# Patient Record
Sex: Male | Born: 1980 | Race: Black or African American | Hispanic: No | Smoking: Current every day smoker
Health system: Southern US, Community
[De-identification: ages and names within clinical notes are randomized; demographics above are authoritative.]

## PROBLEM LIST (undated history)

## (undated) DIAGNOSIS — G8929 Other chronic pain: Secondary | ICD-10-CM

## (undated) DIAGNOSIS — M79643 Pain in unspecified hand: Secondary | ICD-10-CM

## (undated) DIAGNOSIS — M5030 Other cervical disc degeneration, unspecified cervical region: Secondary | ICD-10-CM

## (undated) DIAGNOSIS — M654 Radial styloid tenosynovitis [de Quervain]: Secondary | ICD-10-CM

## (undated) DIAGNOSIS — Z765 Malingerer [conscious simulation]: Secondary | ICD-10-CM

## (undated) DIAGNOSIS — F1120 Opioid dependence, uncomplicated: Secondary | ICD-10-CM

## (undated) DIAGNOSIS — M549 Dorsalgia, unspecified: Secondary | ICD-10-CM

## (undated) HISTORY — PX: SPINAL FUSION: SHX223

---

## 2000-10-17 ENCOUNTER — Emergency Department (HOSPITAL_COMMUNITY): Admission: EM | Admit: 2000-10-17 | Discharge: 2000-10-17 | Payer: Self-pay | Admitting: Internal Medicine

## 2001-01-05 ENCOUNTER — Emergency Department (HOSPITAL_COMMUNITY): Admission: EM | Admit: 2001-01-05 | Discharge: 2001-01-05 | Payer: Self-pay | Admitting: *Deleted

## 2002-01-17 ENCOUNTER — Emergency Department (HOSPITAL_COMMUNITY): Admission: EM | Admit: 2002-01-17 | Discharge: 2002-01-17 | Payer: Self-pay | Admitting: Emergency Medicine

## 2002-01-21 ENCOUNTER — Emergency Department (HOSPITAL_COMMUNITY): Admission: EM | Admit: 2002-01-21 | Discharge: 2002-01-21 | Payer: Self-pay | Admitting: Emergency Medicine

## 2002-07-18 ENCOUNTER — Emergency Department (HOSPITAL_COMMUNITY): Admission: EM | Admit: 2002-07-18 | Discharge: 2002-07-18 | Payer: Self-pay | Admitting: Emergency Medicine

## 2006-12-03 ENCOUNTER — Emergency Department (HOSPITAL_COMMUNITY): Admission: EM | Admit: 2006-12-03 | Discharge: 2006-12-03 | Payer: Self-pay | Admitting: Emergency Medicine

## 2007-11-08 ENCOUNTER — Emergency Department (HOSPITAL_COMMUNITY): Admission: EM | Admit: 2007-11-08 | Discharge: 2007-11-08 | Payer: Self-pay | Admitting: Emergency Medicine

## 2008-01-11 ENCOUNTER — Emergency Department (HOSPITAL_COMMUNITY): Admission: EM | Admit: 2008-01-11 | Discharge: 2008-01-12 | Payer: Self-pay | Admitting: Emergency Medicine

## 2008-02-08 ENCOUNTER — Emergency Department (HOSPITAL_COMMUNITY): Admission: EM | Admit: 2008-02-08 | Discharge: 2008-02-09 | Payer: Self-pay | Admitting: Emergency Medicine

## 2008-07-09 ENCOUNTER — Emergency Department (HOSPITAL_COMMUNITY): Admission: EM | Admit: 2008-07-09 | Discharge: 2008-07-09 | Payer: Self-pay | Admitting: Emergency Medicine

## 2009-01-23 ENCOUNTER — Emergency Department (HOSPITAL_COMMUNITY): Admission: EM | Admit: 2009-01-23 | Discharge: 2009-01-23 | Payer: Self-pay | Admitting: Emergency Medicine

## 2009-02-13 ENCOUNTER — Emergency Department (HOSPITAL_COMMUNITY): Admission: EM | Admit: 2009-02-13 | Discharge: 2009-02-13 | Payer: Self-pay | Admitting: Emergency Medicine

## 2009-06-12 ENCOUNTER — Emergency Department (HOSPITAL_COMMUNITY): Admission: EM | Admit: 2009-06-12 | Discharge: 2009-06-13 | Payer: Self-pay | Admitting: Emergency Medicine

## 2009-11-23 ENCOUNTER — Emergency Department (HOSPITAL_COMMUNITY): Admission: EM | Admit: 2009-11-23 | Discharge: 2009-11-23 | Payer: Self-pay | Admitting: Emergency Medicine

## 2011-03-08 LAB — URINALYSIS, ROUTINE W REFLEX MICROSCOPIC
Glucose, UA: NEGATIVE
Ketones, ur: NEGATIVE
Nitrite: NEGATIVE
Protein, ur: NEGATIVE
Urobilinogen, UA: 1
pH: 6

## 2011-12-18 ENCOUNTER — Emergency Department (HOSPITAL_COMMUNITY): Payer: Self-pay

## 2011-12-18 ENCOUNTER — Emergency Department (HOSPITAL_COMMUNITY)
Admission: EM | Admit: 2011-12-18 | Discharge: 2011-12-18 | Disposition: A | Discharge status: Home / Self Care | Payer: Self-pay | Attending: Emergency Medicine | Admitting: Emergency Medicine

## 2011-12-18 ENCOUNTER — Encounter (HOSPITAL_COMMUNITY): Payer: Self-pay | Admitting: *Deleted

## 2011-12-18 DIAGNOSIS — W138XXA Fall from, out of or through other building or structure, initial encounter: Secondary | ICD-10-CM | POA: Insufficient documentation

## 2011-12-18 DIAGNOSIS — R109 Unspecified abdominal pain: Secondary | ICD-10-CM | POA: Insufficient documentation

## 2011-12-18 DIAGNOSIS — M546 Pain in thoracic spine: Secondary | ICD-10-CM | POA: Insufficient documentation

## 2011-12-18 DIAGNOSIS — M545 Low back pain, unspecified: Secondary | ICD-10-CM | POA: Insufficient documentation

## 2011-12-18 DIAGNOSIS — M549 Dorsalgia, unspecified: Secondary | ICD-10-CM

## 2011-12-18 DIAGNOSIS — Y92009 Unspecified place in unspecified non-institutional (private) residence as the place of occurrence of the external cause: Secondary | ICD-10-CM | POA: Insufficient documentation

## 2011-12-18 DIAGNOSIS — R079 Chest pain, unspecified: Secondary | ICD-10-CM | POA: Insufficient documentation

## 2011-12-18 DIAGNOSIS — W19XXXA Unspecified fall, initial encounter: Secondary | ICD-10-CM

## 2011-12-18 DIAGNOSIS — F17200 Nicotine dependence, unspecified, uncomplicated: Secondary | ICD-10-CM | POA: Insufficient documentation

## 2011-12-18 MED ORDER — HYDROMORPHONE HCL PF 1 MG/ML IJ SOLN
1.0000 mg | Freq: Once | INTRAMUSCULAR | Status: AC
  Administered 2011-12-18: 1 mg via INTRAVENOUS
  Filled 2011-12-18: qty 1

## 2011-12-18 MED ORDER — HYDROCODONE-ACETAMINOPHEN 5-500 MG PO TABS: 1.0000 | ORAL_TABLET | Freq: Four times a day (QID) | ORAL | Status: AC | PRN

## 2011-12-18 MED ORDER — IBUPROFEN 600 MG PO TABS: 600.0000 mg | ORAL_TABLET | Freq: Three times a day (TID) | ORAL | Status: AC | PRN

## 2011-12-18 MED ORDER — IOHEXOL 300 MG/ML  SOLN
100.0000 mL | Freq: Once | INTRAMUSCULAR | Status: AC | PRN
  Administered 2011-12-18: 100 mL via INTRAVENOUS

## 2011-12-18 NOTE — ED Notes (Signed)
Discharge instructions reviewed with pt, questions answered. Pt verbalized understanding.  

## 2011-12-18 NOTE — ED Notes (Addendum)
Pt fell off roof, lost balance .  Pt was on a 2 story house, working on antenna.  Landed on ground.  Pain rt mid to low back, No loc, No head injury.  Pain RUQ.

## 2011-12-18 NOTE — ED Notes (Signed)
Dr. Denton Lank in and removed c-collar from pt.

## 2011-12-18 NOTE — ED Provider Notes (Addendum)
History     CSN: [id number]  Arrival date & time 12/18/11  1741   First MD Initiated Contact with Patient 12/18/11 1817      Chief Complaint  Patient presents with  . Fall    (Consider location/radiation/quality/duration/timing/severity/associated sxs/prior treatment) Patient is a 31 y.o. male presenting with fall. The history is provided by the patient.  Fall Pertinent negatives include no fever, no numbness, no abdominal pain, no vomiting, no hematuria and no headaches.  fall from 2nd story roof to ground. Was at home, plugging a cable into antenna when lost footing and fell. No loc. C/o right flank pain and right back pain. Constant, dull, non radiating, worse w movement.palpation. No neck pain. No radicular pain or leg pain. No numbness/weakness. ruq abd pain. No vomiting. Was asymptomatic prior to slip and fall, no faintess or dizziness.     History reviewed. No pertinent past medical history.  History reviewed. No pertinent past surgical history.  History reviewed. No pertinent family history.  History  Substance Use Topics  . Smoking status: Current Everyday Smoker  . Smokeless tobacco: Not on file  . Alcohol Use: No      Review of Systems  Constitutional: Negative for fever.  HENT: Negative for neck pain.   Eyes: Negative for pain.  Respiratory: Negative for shortness of breath.   Cardiovascular: Negative for chest pain.  Gastrointestinal: Negative for vomiting and abdominal pain.  Genitourinary: Positive for flank pain. Negative for hematuria.  Musculoskeletal: Positive for back pain.  Skin: Negative for rash.  Neurological: Negative for weakness, numbness and headaches.  Hematological: Does not bruise/bleed easily.  Psychiatric/Behavioral: Negative for confusion.    Allergies  Review of patient's allergies indicates no known allergies.  Home Medications  No current outpatient prescriptions on file.  BP 124/86  Pulse 105  Temp 98.6 F (37 C) (Oral)   Resp 20  Ht 5' (1.524 m)  Wt 169 lb 9 oz (76.913 kg)  BMI 33.12 kg/m2  SpO2 98%  Physical Exam  Nursing note and vitals reviewed. Constitutional: He is oriented to person, place, and time. He appears well-developed and well-nourished. No distress.  HENT:  Head: Atraumatic.  Nose: Nose normal.  Mouth/Throat: Oropharynx is clear and moist.  Eyes: Conjunctivae are normal. Pupils are equal, round, and reactive to light. No scleral icterus.  Neck: Normal range of motion. Neck supple. No tracheal deviation present.  Cardiovascular: Normal rate, regular rhythm, normal heart sounds and intact distal pulses.   Pulmonary/Chest: Effort normal and breath sounds normal. No accessory muscle usage. No respiratory distress. He exhibits tenderness.       Tenderness right lower ribs and/lat/posteriorly. No crepitus.   Abdominal: Soft. He exhibits no distension and no mass. There is tenderness. There is no rebound and no guarding.       Ruq/right abd tenderness. No rebound or guarding. No abd contusion or bruising noted.   Musculoskeletal: Normal range of motion. He exhibits no edema.       Diffuse tenderness lower thoracic and upper lumbar region. Spine aligned, no step off. c spine non tender, aligned, no step off. Good rom extremities without pain or focal bony tenderness  Neurological: He is alert and oriented to person, place, and time.       Motor intact bil. Steady gait.   Skin: Skin is warm and dry.  Psychiatric: He has a normal mood and affect.    ED Course  Procedures (including critical care time)  Dg Ribs Unilateral W/chest  Right  12/18/2011  *RADIOLOGY REPORT*  Clinical Data: Fall  RIGHT RIBS AND CHEST - 3+ VIEW  Comparison: None.  Findings: Normal heart size.  Lungs are under aerated and grossly clear.  No evidence of acute rib fracture.  No pneumothorax.  IMPRESSION: Low lung volumes.  No active cardiopulmonary disease.  No acute rib fracture.  Original Report Authenticated By: Donavan Burnet, M.D.   Dg Thoracic Spine 2 View  12/18/2011  *RADIOLOGY REPORT*  Clinical Data: Fall  THORACIC SPINE - 2 VIEW  Comparison: 07/09/2008  Findings: Anatomic alignment.  No vertebral compression deformity. No definite acute fracture.  IMPRESSION: No acute bony pathology in the thoracic spine to suggest injury.  Original Report Authenticated By: Donavan Burnet, M.D.   Ct Abdomen Pelvis W Contrast  12/18/2011  *RADIOLOGY REPORT*  Clinical Data: Fall  CT ABDOMEN AND PELVIS WITH CONTRAST  Technique:  Multidetector CT imaging of the abdomen and pelvis was performed following the standard protocol during bolus administration of intravenous contrast.  Contrast: OMNIPAQUE IOHEXOL 300 MG/ML  SOLN  Comparison: None.  Findings: The liver, gallbladder, spleen, pancreas, adrenal glands, kidneys are within normal limits.  No hemoperitoneum.  No free fluid.  Normal appendix.  Small mesenteric and retroperitoneal nodes.  Bladder and prostate are unremarkable.  No acute bony deformity.  Specifically, no evidence of lumbar compression deformity.  IMPRESSION: No acute intra-abdominal pathology.  No evidence of lumbar spine injury.  Original Report Authenticated By: Donavan Burnet, M.D.       MDM  Iv ns, dilaudid 1 mg iv.   Xr/ct with no acute process.   Recheck spine nt. Pt comfortable.       Suzi Roots, MD 12/18/11 1955  Suzi Roots, MD 12/18/11 [phone]

## 2012-04-29 ENCOUNTER — Encounter (HOSPITAL_COMMUNITY): Payer: Self-pay

## 2012-04-29 ENCOUNTER — Emergency Department (HOSPITAL_COMMUNITY)
Admission: EM | Admit: 2012-04-29 | Discharge: 2012-04-29 | Disposition: A | Discharge status: Home / Self Care | Payer: Self-pay | Attending: Emergency Medicine | Admitting: Emergency Medicine

## 2012-04-29 ENCOUNTER — Emergency Department (HOSPITAL_COMMUNITY): Payer: Self-pay

## 2012-04-29 DIAGNOSIS — Y939 Activity, unspecified: Secondary | ICD-10-CM | POA: Insufficient documentation

## 2012-04-29 DIAGNOSIS — IMO0002 Reserved for concepts with insufficient information to code with codable children: Secondary | ICD-10-CM | POA: Insufficient documentation

## 2012-04-29 DIAGNOSIS — F17200 Nicotine dependence, unspecified, uncomplicated: Secondary | ICD-10-CM | POA: Insufficient documentation

## 2012-04-29 DIAGNOSIS — S93409A Sprain of unspecified ligament of unspecified ankle, initial encounter: Secondary | ICD-10-CM | POA: Insufficient documentation

## 2012-04-29 DIAGNOSIS — Y9289 Other specified places as the place of occurrence of the external cause: Secondary | ICD-10-CM | POA: Insufficient documentation

## 2012-04-29 MED ORDER — HYDROCODONE-ACETAMINOPHEN 5-325 MG PO TABS
1.0000 | ORAL_TABLET | Freq: Once | ORAL | Status: AC
  Administered 2012-04-29: 1 via ORAL
  Filled 2012-04-29: qty 1

## 2012-04-29 MED ORDER — IBUPROFEN 800 MG PO TABS
800.0000 mg | ORAL_TABLET | Freq: Once | ORAL | Status: AC
  Administered 2012-04-29: 800 mg via ORAL
  Filled 2012-04-29: qty 1

## 2012-04-29 NOTE — ED Notes (Signed)
Pt c/o pain to left ankle.  Says hurt ankle playing football this morning.

## 2012-04-29 NOTE — ED Provider Notes (Signed)
History     CSN: [id number]  Arrival date & time 04/29/12  1322   First MD Initiated Contact with Patient 04/29/12 1335      Chief Complaint  Patient presents with  . Ankle Pain    (Consider location/radiation/quality/duration/timing/severity/associated sxs/prior treatment) HPI Comments: Patient c/o pain and swelling to his left ankle that began after playing football and another player tackled him causing a twisting of his ankle.  Patient c/o inability to bear weight to the ankle due to level of pain.  He denies head injury or proximal pain.  Patient is a 31 y.o. male presenting with ankle pain. The history is provided by the patient.  Ankle Pain  The incident occurred 3 to 5 hours ago. The incident occurred in the yard. The injury mechanism was a direct blow, a fall and torsion. The pain is present in the left ankle. The quality of the pain is described as aching and throbbing. The pain is moderate. The pain has been constant since onset. Associated symptoms include inability to bear weight. Pertinent negatives include no numbness, no loss of motion, no muscle weakness, no loss of sensation and no tingling. He reports no foreign bodies present. The symptoms are aggravated by activity, bearing weight and palpation. He has tried nothing for the symptoms. The treatment provided no relief.    History reviewed. No pertinent past medical history.  History reviewed. No pertinent past surgical history.  No family history on file.  History  Substance Use Topics  . Smoking status: Current Every Day Smoker  . Smokeless tobacco: Not on file  . Alcohol Use: No      Review of Systems  Constitutional: Negative for fever and chills.  HENT: Negative for neck pain.   Genitourinary: Negative for dysuria and difficulty urinating.  Musculoskeletal: Positive for joint swelling and arthralgias.  Skin: Negative for color change and wound.  Neurological: Negative for dizziness, tingling,  numbness and headaches.  All other systems reviewed and are negative.    Allergies  Review of patient's allergies indicates no known allergies.  Home Medications  No current outpatient prescriptions on file.  BP 134/84  Pulse 91  Temp 98.6 F (37 C) (Oral)  Resp 20  Ht 5\' 1"  (1.549 m)  Wt 180 lb (81.647 kg)  BMI 34.01 kg/m2  SpO2 99%  Physical Exam  Nursing note and vitals reviewed. Constitutional: He is oriented to person, place, and time. He appears well-developed and well-nourished. No distress.  HENT:  Head: Normocephalic and atraumatic.  Cardiovascular: Normal rate, regular rhythm, normal heart sounds and intact distal pulses.   Pulmonary/Chest: Effort normal and breath sounds normal.  Musculoskeletal: He exhibits tenderness.       left ankle is diffusely ttp, mild to moderate STS is present.  DP pulse is brisk, distal sensation intact.  No erythema, abrasion, bruising or deformity.  No proximal tenderness  Neurological: He is alert and oriented to person, place, and time. He exhibits normal muscle tone. Coordination normal.  Skin: Skin is warm and dry.    ED Course  Procedures (including critical care time)  Labs Reviewed - No data to display Dg Ankle Complete Left  04/29/2012  *RADIOLOGY REPORT*  Clinical Data: Ankle pain, football injury  LEFT ANKLE COMPLETE - 3+ VIEW  Comparison: None.  Findings: Lateral soft tissue swelling.  No fracture or dislocation.  Ankle mortise intact.  IMPRESSION: As above.   Original Report Authenticated By: Davonna Belling, M.D.      ASO  splint applied, crutches given.  Pain improved, remains NV intact   MDM     Diffuse ttp of the left medial and lateral ankle.  Moderate STS.  Pt agrees to RICE therapy and to f/u with Dr. Romeo Apple.    Prescribed: norco #15 ibuprofen  (handwritten due to printer issue)    Ishanvi Mcquitty L. [place], [place] 04/30/12 2209

## 2012-05-02 NOTE — ED Provider Notes (Signed)
Medical screening examination/treatment/procedure(s) were performed by non-physician practitioner and as supervising physician I was immediately available for consultation/collaboration. Lynann Demetrius, MD, FACEP   Dino Borntreger L [place] Dalporto, MD 05/02/12 1506 

## 2012-12-26 ENCOUNTER — Emergency Department (HOSPITAL_COMMUNITY)
Admission: EM | Admit: 2012-12-26 | Discharge: 2012-12-26 | Disposition: A | Discharge status: Home / Self Care | Payer: Self-pay | Attending: Emergency Medicine | Admitting: Emergency Medicine

## 2012-12-26 ENCOUNTER — Encounter (HOSPITAL_COMMUNITY): Payer: Self-pay | Admitting: *Deleted

## 2012-12-26 ENCOUNTER — Emergency Department (HOSPITAL_COMMUNITY): Payer: Self-pay

## 2012-12-26 DIAGNOSIS — S6990XA Unspecified injury of unspecified wrist, hand and finger(s), initial encounter: Secondary | ICD-10-CM | POA: Insufficient documentation

## 2012-12-26 DIAGNOSIS — Y92410 Unspecified street and highway as the place of occurrence of the external cause: Secondary | ICD-10-CM | POA: Insufficient documentation

## 2012-12-26 DIAGNOSIS — Y9389 Activity, other specified: Secondary | ICD-10-CM | POA: Insufficient documentation

## 2012-12-26 DIAGNOSIS — S46911A Strain of unspecified muscle, fascia and tendon at shoulder and upper arm level, right arm, initial encounter: Secondary | ICD-10-CM

## 2012-12-26 DIAGNOSIS — F17200 Nicotine dependence, unspecified, uncomplicated: Secondary | ICD-10-CM | POA: Insufficient documentation

## 2012-12-26 DIAGNOSIS — IMO0002 Reserved for concepts with insufficient information to code with codable children: Secondary | ICD-10-CM | POA: Insufficient documentation

## 2012-12-26 DIAGNOSIS — S8990XA Unspecified injury of unspecified lower leg, initial encounter: Secondary | ICD-10-CM | POA: Insufficient documentation

## 2012-12-26 DIAGNOSIS — S82892A Other fracture of left lower leg, initial encounter for closed fracture: Secondary | ICD-10-CM

## 2012-12-26 DIAGNOSIS — S8253XA Displaced fracture of medial malleolus of unspecified tibia, initial encounter for closed fracture: Secondary | ICD-10-CM | POA: Insufficient documentation

## 2012-12-26 MED ORDER — ONDANSETRON HCL 4 MG PO TABS
4.0000 mg | ORAL_TABLET | Freq: Once | ORAL | Status: AC
  Administered 2012-12-26: 4 mg via ORAL
  Filled 2012-12-26: qty 1

## 2012-12-26 MED ORDER — DIAZEPAM 5 MG PO TABS
5.0000 mg | ORAL_TABLET | Freq: Once | ORAL | Status: AC
  Administered 2012-12-26: 5 mg via ORAL
  Filled 2012-12-26: qty 1

## 2012-12-26 MED ORDER — HYDROCODONE-ACETAMINOPHEN 5-325 MG PO TABS
2.0000 | ORAL_TABLET | Freq: Once | ORAL | Status: AC
  Administered 2012-12-26: 2 via ORAL
  Filled 2012-12-26: qty 2

## 2012-12-26 MED ORDER — BACLOFEN 10 MG PO TABS: 10.0000 mg | ORAL_TABLET | Freq: Three times a day (TID) | ORAL | Status: AC

## 2012-12-26 MED ORDER — MELOXICAM 7.5 MG PO TABS: ORAL_TABLET | ORAL | Status: DC

## 2012-12-26 MED ORDER — HYDROCODONE-ACETAMINOPHEN 7.5-325 MG PO TABS: 1.0000 | ORAL_TABLET | ORAL | Status: DC | PRN
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
## 2012-12-26 NOTE — ED Notes (Signed)
H. Bryant, PA at bedside. 
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
## 2012-12-29 NOTE — ED Provider Notes (Signed)
Medical screening examination/treatment/procedure(s) were performed by non-physician practitioner and as supervising physician I was immediately available for consultation/collaboration.  Karen Kinnard B. Hau Sanor, MD 12/29/12 1654 

## 2013-05-06 ENCOUNTER — Emergency Department (HOSPITAL_COMMUNITY): Payer: Self-pay

## 2013-05-06 ENCOUNTER — Encounter (HOSPITAL_COMMUNITY): Payer: Self-pay | Admitting: Emergency Medicine

## 2013-05-06 DIAGNOSIS — S6990XA Unspecified injury of unspecified wrist, hand and finger(s), initial encounter: Secondary | ICD-10-CM | POA: Insufficient documentation

## 2013-05-06 DIAGNOSIS — Y9389 Activity, other specified: Secondary | ICD-10-CM | POA: Insufficient documentation

## 2013-05-06 DIAGNOSIS — S59909A Unspecified injury of unspecified elbow, initial encounter: Secondary | ICD-10-CM | POA: Insufficient documentation

## 2013-05-06 DIAGNOSIS — X500XXA Overexertion from strenuous movement or load, initial encounter: Secondary | ICD-10-CM | POA: Insufficient documentation

## 2013-05-06 DIAGNOSIS — R296 Repeated falls: Secondary | ICD-10-CM | POA: Insufficient documentation

## 2013-05-06 DIAGNOSIS — F17200 Nicotine dependence, unspecified, uncomplicated: Secondary | ICD-10-CM | POA: Insufficient documentation

## 2013-05-06 DIAGNOSIS — Y990 Civilian activity done for income or pay: Secondary | ICD-10-CM | POA: Insufficient documentation

## 2013-05-06 DIAGNOSIS — S6980XA Other specified injuries of unspecified wrist, hand and finger(s), initial encounter: Secondary | ICD-10-CM | POA: Insufficient documentation

## 2013-05-06 DIAGNOSIS — S4980XA Other specified injuries of shoulder and upper arm, unspecified arm, initial encounter: Secondary | ICD-10-CM | POA: Insufficient documentation

## 2013-05-06 DIAGNOSIS — Y9289 Other specified places as the place of occurrence of the external cause: Secondary | ICD-10-CM | POA: Insufficient documentation

## 2013-05-06 DIAGNOSIS — S46909A Unspecified injury of unspecified muscle, fascia and tendon at shoulder and upper arm level, unspecified arm, initial encounter: Secondary | ICD-10-CM | POA: Insufficient documentation

## 2013-05-06 NOTE — ED Notes (Signed)
Patient states he was at work yesterday and when he turned a knob he felt a pop in his right hand. Complaining of increasing pain in right hand.

## 2013-05-07 ENCOUNTER — Emergency Department (HOSPITAL_COMMUNITY)
Admission: EM | Admit: 2013-05-07 | Discharge: 2013-05-07 | Disposition: A | Discharge status: Home / Self Care | Payer: Self-pay | Attending: Emergency Medicine | Admitting: Emergency Medicine

## 2013-05-07 DIAGNOSIS — S6992XA Unspecified injury of left wrist, hand and finger(s), initial encounter: Secondary | ICD-10-CM

## 2013-05-07 MED ORDER — HYDROCODONE-ACETAMINOPHEN 5-325 MG PO TABS
1.0000 | ORAL_TABLET | Freq: Once | ORAL | Status: AC
  Administered 2013-05-07: 1 via ORAL
  Filled 2013-05-07: qty 1

## 2013-05-07 MED ORDER — HYDROCODONE-ACETAMINOPHEN 5-325 MG PO TABS: 1.0000 | ORAL_TABLET | ORAL | Status: DC | PRN

## 2013-05-07 MED ORDER — IBUPROFEN 800 MG PO TABS: 800.0000 mg | ORAL_TABLET | Freq: Three times a day (TID) | ORAL | Status: DC

## 2013-05-07 MED ORDER — IBUPROFEN 800 MG PO TABS
800.0000 mg | ORAL_TABLET | Freq: Once | ORAL | Status: AC
  Administered 2013-05-07: 800 mg via ORAL
  Filled 2013-05-07: qty 1

## 2013-05-07 MED ORDER — ONDANSETRON HCL 4 MG PO TABS
4.0000 mg | ORAL_TABLET | Freq: Once | ORAL | Status: AC
  Administered 2013-05-07: 4 mg via ORAL
  Filled 2013-05-07: qty 1

## 2013-05-07 NOTE — ED Provider Notes (Signed)
Medical screening examination/treatment/procedure(s) were performed by non-physician practitioner and as supervising physician I was immediately available for consultation/collaboration.    Sunnie Nielsen, MD 05/07/13 [phone]

## 2013-05-07 NOTE — ED Provider Notes (Signed)
CSN: [id number]     Arrival date & time 05/06/13  2133 History   First MD Initiated Contact with Patient 05/07/13 0013     Chief Complaint  Patient presents with  . Hand Pain   (Consider location/radiation/quality/duration/timing/severity/associated sxs/prior Treatment) HPI Comments: Pt fell on an out stretched hand and injured the right hand. Pt has pain with ROM of the thumb and soreness of the other fingers.  No previous surgeries of the right hand. He has had injury of the right hand. Pt is not on blood thinning meds, and has no hx of bleeding disorders. He has not taken any medication for this problem.   Patient is a 32 y.o. male presenting with hand pain. The history is provided by the patient.  Hand Pain Pertinent negatives include no abdominal pain, arthralgias, chest pain, coughing or neck pain.    History reviewed. No pertinent past medical history. History reviewed. No pertinent past surgical history. History reviewed. No pertinent family history. History  Substance Use Topics  . Smoking status: Current Every Day Smoker -- 0.50 packs/day    Types: Cigarettes  . Smokeless tobacco: Not on file  . Alcohol Use: No    Review of Systems  Constitutional: Negative for activity change.       All ROS Neg except as noted in HPI  HENT: Negative for nosebleeds.   Eyes: Negative for photophobia and discharge.  Respiratory: Negative for cough, shortness of breath and wheezing.   Cardiovascular: Negative for chest pain and palpitations.  Gastrointestinal: Negative for abdominal pain and blood in stool.  Genitourinary: Negative for dysuria, frequency and hematuria.  Musculoskeletal: Negative for arthralgias, back pain and neck pain.  Skin: Negative.   Neurological: Negative for dizziness, seizures and speech difficulty.  Psychiatric/Behavioral: Negative for hallucinations and confusion.    Allergies  Review of patient's allergies indicates no known allergies.  Home Medications    Current Outpatient Rx  Name  Route  Sig  Dispense  Refill  . HYDROcodone-acetaminophen (NORCO) 7.5-325 MG per tablet   Oral   Take 1 tablet by mouth every 4 (four) hours as needed for pain.   20 tablet   0   . meloxicam (MOBIC) 7.5 MG tablet      1 po bid with food   12 tablet   0    BP 140/62  Pulse 84  Temp(Src) 99.1 F (37.3 C) (Oral)  Resp 20  Ht 5\' 2"  (1.575 m)  Wt 166 lb 5 oz (75.439 kg)  BMI 30.41 kg/m2  SpO2 100% Physical Exam  Nursing note and vitals reviewed. Constitutional: He is oriented to person, place, and time. He appears well-developed and well-nourished.  Non-toxic appearance.  HENT:  Head: Normocephalic.  Right Ear: Tympanic membrane and external ear normal.  Left Ear: Tympanic membrane and external ear normal.  Eyes: EOM and lids are normal. Pupils are equal, round, and reactive to light.  Neck: Normal range of motion. Neck supple. Carotid bruit is not present.  Cardiovascular: Normal rate, regular rhythm, normal heart sounds, intact distal pulses and normal pulses.   Pulmonary/Chest: Breath sounds normal. No respiratory distress.  Abdominal: Soft. Bowel sounds are normal. There is no tenderness. There is no guarding.  Musculoskeletal: Normal range of motion.  There is soreness with ROM of the right shoulder and elbow. Pain with ROM of the right thumb, 2nd and 3rd finger. Cap refill less than 2 sec. Distal pulses 2+.   Lymphadenopathy:       Head (  right side): No submandibular adenopathy present.       Head (left side): No submandibular adenopathy present.    He has no cervical adenopathy.  Neurological: He is alert and oriented to person, place, and time. He has normal strength. No cranial nerve deficit or sensory deficit.  Skin: Skin is warm and dry.  Psychiatric: He has a normal mood and affect. His speech is normal.    ED Course  Procedures (including critical care time) Labs Review Labs Reviewed - No data to display Imaging Review Dg  Hand Complete Right  05/06/2013   CLINICAL DATA:  Fall.  Right hand injury and pain.  EXAM: RIGHT HAND - COMPLETE 3+ VIEW  COMPARISON:  12/26/2012  FINDINGS: There is no evidence of fracture or dislocation. There is no evidence of arthropathy or other focal bone abnormality. Soft tissues are unremarkable.  IMPRESSION: Negative.   Electronically Signed   By: Myles Rosenthal M.D.   On: 05/06/2013 22:15    EKG Interpretation   None       MDM  No diagnosis found. **I have reviewed nursing notes, vital signs, and all appropriate lab and imaging results for this patient.*  Xray of trhe right hand is negative for fx or dislocation. Pt fitted with thumb spica splint.  He is given Rx for ibuprofen 800mg  and norco. Pt to follow up with Dr [name] for recheck.  Kathie Dike, PA-C 05/07/13 [phone]

## 2013-12-31 ENCOUNTER — Emergency Department (HOSPITAL_COMMUNITY)
Admission: EM | Admit: 2013-12-31 | Discharge: 2013-12-31 | Disposition: A | Discharge status: Home / Self Care | Payer: Self-pay | Attending: Emergency Medicine | Admitting: Emergency Medicine

## 2013-12-31 ENCOUNTER — Encounter (HOSPITAL_COMMUNITY): Payer: Self-pay | Admitting: Emergency Medicine

## 2013-12-31 ENCOUNTER — Emergency Department (HOSPITAL_COMMUNITY): Payer: Self-pay

## 2013-12-31 DIAGNOSIS — B369 Superficial mycosis, unspecified: Secondary | ICD-10-CM | POA: Insufficient documentation

## 2013-12-31 DIAGNOSIS — S6990XA Unspecified injury of unspecified wrist, hand and finger(s), initial encounter: Principal | ICD-10-CM

## 2013-12-31 DIAGNOSIS — W010XXA Fall on same level from slipping, tripping and stumbling without subsequent striking against object, initial encounter: Secondary | ICD-10-CM | POA: Insufficient documentation

## 2013-12-31 DIAGNOSIS — G5601 Carpal tunnel syndrome, right upper limb: Secondary | ICD-10-CM

## 2013-12-31 DIAGNOSIS — Z79899 Other long term (current) drug therapy: Secondary | ICD-10-CM | POA: Insufficient documentation

## 2013-12-31 DIAGNOSIS — Y9389 Activity, other specified: Secondary | ICD-10-CM | POA: Insufficient documentation

## 2013-12-31 DIAGNOSIS — G5600 Carpal tunnel syndrome, unspecified upper limb: Secondary | ICD-10-CM | POA: Insufficient documentation

## 2013-12-31 DIAGNOSIS — S59909A Unspecified injury of unspecified elbow, initial encounter: Secondary | ICD-10-CM | POA: Insufficient documentation

## 2013-12-31 DIAGNOSIS — Y929 Unspecified place or not applicable: Secondary | ICD-10-CM | POA: Insufficient documentation

## 2013-12-31 DIAGNOSIS — Z791 Long term (current) use of non-steroidal anti-inflammatories (NSAID): Secondary | ICD-10-CM | POA: Insufficient documentation

## 2013-12-31 DIAGNOSIS — F17200 Nicotine dependence, unspecified, uncomplicated: Secondary | ICD-10-CM | POA: Insufficient documentation

## 2013-12-31 DIAGNOSIS — R52 Pain, unspecified: Secondary | ICD-10-CM | POA: Insufficient documentation

## 2013-12-31 DIAGNOSIS — S59919A Unspecified injury of unspecified forearm, initial encounter: Principal | ICD-10-CM

## 2013-12-31 DIAGNOSIS — M25531 Pain in right wrist: Secondary | ICD-10-CM

## 2013-12-31 MED ORDER — NAPROXEN 500 MG PO TABS: 500.0000 mg | ORAL_TABLET | Freq: Two times a day (BID) | ORAL | Status: DC

## 2013-12-31 MED ORDER — HYDROCODONE-ACETAMINOPHEN 5-325 MG PO TABS
1.0000 | ORAL_TABLET | Freq: Once | ORAL | Status: AC
  Administered 2013-12-31: 1 via ORAL
  Filled 2013-12-31: qty 1

## 2013-12-31 MED ORDER — HYDROCODONE-ACETAMINOPHEN 5-325 MG PO TABS: ORAL_TABLET | ORAL | Status: DC

## 2013-12-31 MED ORDER — CLOTRIMAZOLE 1 % EX CREA: TOPICAL_CREAM | CUTANEOUS | Status: DC

## 2013-12-31 NOTE — Discharge Instructions (Signed)
Carpal Tunnel Syndrome °The carpal tunnel is an area under the skin of the palm of your hand. Nerves, blood vessels, and strong tissues (tendons) pass through the tunnel. The tunnel can become puffy (swollen). If this happens, a nerve can be pinched in the wrist. This causes carpal tunnel syndrome.  °HOME CARE °· Take all medicine as told by your doctor. °· If you were given a splint, wear it as told. Wear it at night or at times when your doctor told you to. °· Rest your wrist from the activity that causes your pain. °· Put ice on your wrist after long periods of wrist activity. °¨ Put ice in a plastic bag. °¨ Place a towel between your skin and the bag. °¨ Leave the ice on for 15-20 minutes, 03-04 times a day. °· Keep all doctor visits as told. °GET HELP RIGHT AWAY IF: °· You have new problems you cannot explain. °· Your problems get worse and medicine does not help. °MAKE SURE YOU:  °· Understand these instructions. °· Will watch your condition. °· Will get help right away if you are not doing well or get worse. °Document Released: 05/18/2011 Document Revised: 08/21/2011 Document Reviewed: 05/18/2011 °[employer]® Patient Information ©2015 [employer]. This information is not intended to replace advice given to you by your health care provider. Make sure you discuss any questions you have with your health care provider. ° °

## 2013-12-31 NOTE — ED Notes (Signed)
Pt also presents with rash-like area to lower mid abdomen. Pt reports pain and tenderness in area.

## 2013-12-31 NOTE — ED Notes (Signed)
Pt c/o right hand pain that radiates up entire right arm. Pt states he tripped and fell today and landed with his hand out.

## 2013-12-31 NOTE — ED Notes (Signed)
Pt's named called to lobby without response.

## 2014-01-02 NOTE — ED Provider Notes (Signed)
CSN: [id number]     Arrival date & time 12/31/13  1712 History   First MD Initiated Contact with Patient 12/31/13 1801     Chief Complaint  Patient presents with  . Hand Pain  . Arm Pain  . Rash     (Consider location/radiation/quality/duration/timing/severity/associated sxs/prior Treatment)   The history is provided by the patient.   Steve Henry is a 33 y.o. male who presents to the Emergency Department complaining of right hand and wrist pain that radiates to the right elbow.  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  Today, he states he slipped and fell onto an outstretched hand which exacerbated the pain.  He describes the pain as sharp and radiating at times int ot the elbow.  He also reports tingling sensations to the fingers.  He denies redness, swelling or neck pain.  Patient also c/o itching, red rash to his mid abdomen for several weeks.  He states that he has used OTC creams without relief.     History reviewed. No pertinent past medical history. History reviewed. No pertinent past surgical history. History reviewed. No pertinent family history. History  Substance Use Topics  . Smoking status: Current Every Day Smoker -- 0.50 packs/day    Types: Cigarettes  . Smokeless tobacco: Not on file  . Alcohol Use: No    Review of Systems  Constitutional: Negative for fever and chills.  Genitourinary: Negative for dysuria and difficulty urinating.  Musculoskeletal: Positive for arthralgias. Negative for joint swelling, neck pain and neck stiffness.  Skin: Positive for rash. Negative for color change and wound.  Neurological: Negative for dizziness, syncope, weakness and numbness.  All other systems reviewed and are negative.     Allergies  Review of patient's allergies indicates no known allergies.  Home Medications   Prior to Admission medications   Medication Sig Start  Date End Date Taking? Authorizing Provider  clotrimazole (LOTRIMIN) 1 % cream Apply to affected area 2 times daily 12/31/13   Hebert Dooling L. Davin Muramoto, PA-C  HYDROcodone-acetaminophen (NORCO/VICODIN) 5-325 MG per tablet Take one-two tabs po q 4-6 hrs prn pain 12/31/13   Strider Vallance L. Enis Riecke, PA-C  naproxen (NAPROSYN) 500 MG tablet Take 1 tablet (500 mg total) by mouth 2 (two) times daily. 12/31/13   Zymire Turnbo L. Jerrol Helmers, PA-C   BP 113/84  Pulse 97  Temp(Src) 98.4 F (36.9 C) (Oral)  Resp 18  SpO2 98% Physical Exam  Nursing note and vitals reviewed. Constitutional: He is oriented to person, place, and time. He appears well-developed and well-nourished. No distress.  HENT:  Head: Normocephalic and atraumatic.  Neck: Normal range of motion. Neck supple.  Cardiovascular: Normal rate, regular rhythm, normal heart sounds and intact distal pulses.   No murmur heard. Pulmonary/Chest: Effort normal and breath sounds normal. No respiratory distress.  Abdominal: Soft. He exhibits no distension. There is no tenderness. There is no rebound and no guarding.  Musculoskeletal: He exhibits tenderness. He exhibits no edema.       Right hand: Normal strength noted. He exhibits no finger abduction and no thumb/finger opposition.  ttp of the distal right wrist and dorsal hand.  No abrasions, edema or erythema.  No bruising or bony deformity.  Radial pulse is brisk, gross distal sensation intact.  Mildly positive Tinel's sign.    Neurological: He is alert and oriented to person, place, and time. He exhibits normal muscle tone. Coordination normal.  Skin: Skin is warm and dry.  Confluent area of erythema and serous weeping of the skin along the mid abdomen just inferior to the umbilicus.  No purulent drainage, induration , pustules or vesicles.    ED Course  Procedures (including critical care time) Labs Review Labs Reviewed - No data to display  Imaging Review Dg Elbow Complete Right  12/31/2013   CLINICAL DATA:   Fall.  Pain.  EXAM: RIGHT ELBOW - COMPLETE 3+ VIEW  COMPARISON:  None.  FINDINGS: There is no evidence of fracture, dislocation, or joint effusion. There is no evidence of arthropathy or other focal bone abnormality. Soft tissues are unremarkable.  IMPRESSION: Negative.   Electronically Signed   By: Misty Stanley M.D.   On: 12/31/2013 18:50   Dg Wrist Complete Right  12/31/2013   CLINICAL DATA:  Rash.  Right wrist pain.  EXAM: RIGHT WRIST - COMPLETE 3+ VIEW  COMPARISON:  None.  FINDINGS: Imaged bones, joints and soft tissues appear normal.  IMPRESSION: Negative examination.   Electronically Signed   By: Inge Rise M.D.   On: 12/31/2013 18:50     EKG Interpretation None      MDM   Final diagnoses:  Wrist pain, acute, right  Carpal tunnel syndrome, right  Fungal infection of skin of abdomen    Pt is well appearing.  Wrist pain likely related to sprain although he also has likely carpal tunnel syndrome.  Plan includes symptomatic treatment with vicodin, naprosyn and wrist splint.  He was given referral to Dr. Aline Brochure and lotrisone cream for likely tinea corporis  Wrist splint applied, pain improved, remains NV intact.     Doratha Mcswain L. Vanessa Malta, PA-C 01/02/14 [phone]

## 2014-01-03 NOTE — ED Provider Notes (Signed)
Medical screening examination/treatment/procedure(s) were performed by non-physician practitioner and as supervising physician I was immediately available for consultation/collaboration.   EKG Interpretation None       Virgel Manifold, MD 01/03/14 [phone]

## 2014-03-24 ENCOUNTER — Encounter (HOSPITAL_COMMUNITY): Payer: Self-pay | Admitting: Emergency Medicine

## 2014-03-24 ENCOUNTER — Emergency Department (HOSPITAL_COMMUNITY)
Admission: EM | Admit: 2014-03-24 | Discharge: 2014-03-24 | Disposition: A | Discharge status: Home / Self Care | Payer: Self-pay | Attending: Emergency Medicine | Admitting: Emergency Medicine

## 2014-03-24 ENCOUNTER — Emergency Department (HOSPITAL_COMMUNITY): Payer: Self-pay

## 2014-03-24 DIAGNOSIS — R609 Edema, unspecified: Secondary | ICD-10-CM

## 2014-03-24 DIAGNOSIS — Z87891 Personal history of nicotine dependence: Secondary | ICD-10-CM | POA: Insufficient documentation

## 2014-03-24 DIAGNOSIS — S6991XA Unspecified injury of right wrist, hand and finger(s), initial encounter: Secondary | ICD-10-CM | POA: Insufficient documentation

## 2014-03-24 DIAGNOSIS — W102XXA Fall (on)(from) incline, initial encounter: Secondary | ICD-10-CM | POA: Insufficient documentation

## 2014-03-24 DIAGNOSIS — S4991XA Unspecified injury of right shoulder and upper arm, initial encounter: Secondary | ICD-10-CM | POA: Insufficient documentation

## 2014-03-24 DIAGNOSIS — S6992XA Unspecified injury of left wrist, hand and finger(s), initial encounter: Secondary | ICD-10-CM | POA: Insufficient documentation

## 2014-03-24 DIAGNOSIS — Z79899 Other long term (current) drug therapy: Secondary | ICD-10-CM | POA: Insufficient documentation

## 2014-03-24 DIAGNOSIS — Z791 Long term (current) use of non-steroidal anti-inflammatories (NSAID): Secondary | ICD-10-CM | POA: Insufficient documentation

## 2014-03-24 MED ORDER — TRAMADOL HCL 50 MG PO TABS: 50.0000 mg | ORAL_TABLET | Freq: Four times a day (QID) | ORAL | Status: DC | PRN

## 2014-03-24 NOTE — Discharge Instructions (Signed)
Follow up with dr. Aline Brochure next week for recheck

## 2014-03-24 NOTE — ED Notes (Signed)
MD at bedside. 

## 2014-03-24 NOTE — ED Provider Notes (Signed)
CSN: [id number]     Arrival date & time 03/24/14  1713 History   First MD Initiated Contact with Patient 03/24/14 1822     Chief Complaint  Patient presents with  . Hand Pain     (Consider location/radiation/quality/duration/timing/severity/associated sxs/prior Treatment) Patient is a 33 y.o. male presenting with fall. The history is provided by the patient (the pt fell of a ladder and hurt his right shoulder and left and right hands.  no loc and no head pain).  Fall This is a new problem. The current episode started 3 to 5 hours ago. The problem occurs constantly. The problem has not changed since onset.Pertinent negatives include no chest pain, no abdominal pain and no headaches. Nothing aggravates the symptoms. Nothing relieves the symptoms.    History reviewed. No pertinent past medical history. History reviewed. No pertinent past surgical history. History reviewed. No pertinent family history. History  Substance Use Topics  . Smoking status: Former Smoker -- 0.50 packs/day    Types: Cigarettes    Quit date: 12/22/2013  . Smokeless tobacco: Not on file  . Alcohol Use: No    Review of Systems  Constitutional: Negative for appetite change and fatigue.  HENT: Negative for congestion, ear discharge and sinus pressure.   Eyes: Negative for discharge.  Respiratory: Negative for cough.   Cardiovascular: Negative for chest pain.  Gastrointestinal: Negative for abdominal pain and diarrhea.  Genitourinary: Negative for frequency and hematuria.  Musculoskeletal: Negative for back pain.       Left hand and right hand pain and left shoulder  Skin: Negative for rash.  Neurological: Negative for seizures and headaches.  Psychiatric/Behavioral: Negative for hallucinations.      Allergies  Review of patient's allergies indicates no known allergies.  Home Medications   Prior to Admission medications   Medication Sig Start Date End Date Taking? Authorizing Provider  clotrimazole  (LOTRIMIN) 1 % cream Apply to affected area 2 times daily 12/31/13   Tammy L. Triplett, PA-C  HYDROcodone-acetaminophen (NORCO/VICODIN) 5-325 MG per tablet Take one-two tabs po q 4-6 hrs prn pain 12/31/13   Tammy L. Triplett, PA-C  naproxen (NAPROSYN) 500 MG tablet Take 1 tablet (500 mg total) by mouth 2 (two) times daily. 12/31/13   Tammy L. Triplett, PA-C  traMADol (ULTRAM) 50 MG tablet Take 1 tablet (50 mg total) by mouth every 6 (six) hours as needed. 03/24/14   Maudry Diego, MD   BP 123/82  Pulse 84  Temp(Src) 98.8 F (37.1 C) (Oral)  Resp 18  Ht 5\' 2"  (1.575 m)  Wt 174 lb 4 oz (79.039 kg)  BMI 31.86 kg/m2  SpO2 100% Physical Exam  Constitutional: He is oriented to person, place, and time. He appears well-developed.  HENT:  Head: Normocephalic.  Eyes: Conjunctivae and EOM are normal. No scleral icterus.  Neck: Neck supple. No thyromegaly present.  Cardiovascular: Normal rate and regular rhythm.  Exam reveals no gallop and no friction rub.   No murmur heard. Pulmonary/Chest: No stridor. He has no wheezes. He has no rales. He exhibits no tenderness.  Abdominal: He exhibits no distension. There is no tenderness. There is no rebound.  Musculoskeletal: Normal range of motion. He exhibits no edema.  Tender right shoulder and left small finger and right wrist  Lymphadenopathy:    He has no cervical adenopathy.  Neurological: He is oriented to person, place, and time. He exhibits normal muscle tone. Coordination normal.  Skin: No rash noted. No erythema.  Psychiatric: He  has a normal mood and affect. His behavior is normal.    ED Course  Procedures (including critical care time) Labs Review Labs Reviewed - No data to display  Imaging Review Dg Shoulder Right  03/24/2014   CLINICAL DATA:  Fall from a ladder.  Right shoulder injury.  EXAM: RIGHT SHOULDER - 2+ VIEW  COMPARISON:  12/26/2012  FINDINGS: The frontal projection is internally rotated and mildly abducted.  No fracture or  dislocation observed. Subacromial morphology is type 2 (curved). No acute bony findings.  IMPRESSION: 1.  No significant abnormality identified.   Electronically Signed   By: Sherryl Barters M.D.   On: 03/24/2014 18:25   Dg Wrist Complete Right  03/24/2014   CLINICAL DATA:  Fall from ladder, wrist pain  EXAM: RIGHT WRIST - COMPLETE 3+ VIEW  COMPARISON:  None.  FINDINGS: No fracture or dislocation is seen.  The joint spaces are preserved.  The visualized soft tissues are unremarkable.  IMPRESSION: No fracture or dislocation is seen.   Electronically Signed   By: Julian Hy M.D.   On: 03/24/2014 19:35   Dg Finger Little Left  03/24/2014   CLINICAL DATA:  Fall from a ladder earlier this morning, now with little finger pain, primarily involving the PIP joint. Initial encounter.  EXAM: LEFT LITTLE FINGER 2+V  COMPARISON:  None.  FINDINGS: There is mild diffuse soft tissue swelling about the PIP joint. No associated fracture, dislocation or radiopaque foreign body. Joint spaces are preserved. No erosions.  IMPRESSION: Mild diffuse soft tissue swelling about the PIP joint of the little digit without associated fracture, dislocation or radiopaque foreign body   Electronically Signed   By: Sandi Mariscal M.D.   On: 03/24/2014 18:25     EKG Interpretation None      MDM   Final diagnoses:  Fall (on)(from) incline, initial encounter    Neg studies,  Pt to follow up with ortho     Maudry Diego, MD 03/24/14 [phone]

## 2014-03-24 NOTE — ED Notes (Signed)
Pt fell from ladder about 15-20 feet today. Pt c/o left pinky finger pain, area slightly swollen and red. Cap refill wnl. Radial pulses wnl. Pt c/o right shoulder pain. Pt very tender to touch to posterior/lateral and anterior shoulder and right clavicle area. No obvious deformity noted. Pt denies hitting head or LOC. Denies neck pain . Alert/oriented. Nad.

## 2014-08-01 ENCOUNTER — Emergency Department (HOSPITAL_COMMUNITY)
Admission: EM | Admit: 2014-08-01 | Discharge: 2014-08-01 | Disposition: A | Discharge status: Home / Self Care | Payer: Self-pay | Attending: Emergency Medicine | Admitting: Emergency Medicine

## 2014-08-01 ENCOUNTER — Encounter (HOSPITAL_COMMUNITY): Payer: Self-pay | Admitting: Emergency Medicine

## 2014-08-01 DIAGNOSIS — Z79899 Other long term (current) drug therapy: Secondary | ICD-10-CM | POA: Insufficient documentation

## 2014-08-01 DIAGNOSIS — Z791 Long term (current) use of non-steroidal anti-inflammatories (NSAID): Secondary | ICD-10-CM | POA: Insufficient documentation

## 2014-08-01 DIAGNOSIS — M791 Myalgia: Secondary | ICD-10-CM | POA: Insufficient documentation

## 2014-08-01 DIAGNOSIS — Z87891 Personal history of nicotine dependence: Secondary | ICD-10-CM | POA: Insufficient documentation

## 2014-08-01 DIAGNOSIS — J40 Bronchitis, not specified as acute or chronic: Secondary | ICD-10-CM | POA: Insufficient documentation

## 2014-08-01 LAB — CBC WITH DIFFERENTIAL/PLATELET
Basophils Absolute: 0 10*3/uL (ref 0.0–0.1)
Basophils Relative: 0 % (ref 0–1)
EOS ABS: 0.2 10*3/uL (ref 0.0–0.7)
EOS PCT: 2 % (ref 0–5)
HEMATOCRIT: 41.6 % (ref 39.0–52.0)
Hemoglobin: 14.5 g/dL (ref 13.0–17.0)
LYMPHS ABS: 1.4 10*3/uL (ref 0.7–4.0)
Lymphocytes Relative: 21 % (ref 12–46)
MCH: 29.1 pg (ref 26.0–34.0)
MCHC: 34.9 g/dL (ref 30.0–36.0)
MCV: 83.5 fL (ref 78.0–100.0)
Monocytes Absolute: 0.8 10*3/uL (ref 0.1–1.0)
Monocytes Relative: 11 % (ref 3–12)
NEUTROS ABS: 4.3 10*3/uL (ref 1.7–7.7)
Neutrophils Relative %: 66 % (ref 43–77)
Platelets: 266 10*3/uL (ref 150–400)
RBC: 4.98 MIL/uL (ref 4.22–5.81)
RDW: 13.2 % (ref 11.5–15.5)
WBC: 6.6 10*3/uL (ref 4.0–10.5)

## 2014-08-01 LAB — COMPREHENSIVE METABOLIC PANEL
ALBUMIN: 3.9 g/dL (ref 3.5–5.2)
ALT: 25 U/L (ref 0–53)
AST: 31 U/L (ref 0–37)
Alkaline Phosphatase: 94 U/L (ref 39–117)
Anion gap: 5 (ref 5–15)
BUN: 15 mg/dL (ref 6–23)
CHLORIDE: 112 mmol/L (ref 96–112)
CO2: 22 mmol/L (ref 19–32)
Calcium: 8.4 mg/dL (ref 8.4–10.5)
Creatinine, Ser: 1.18 mg/dL (ref 0.50–1.35)
GFR calc Af Amer: >90 mL/min (ref >90)
GFR calc non Af Amer: 79 mL/min — ABNORMAL LOW (ref >90)
GLUCOSE: 102 mg/dL — AB (ref 70–99)
Potassium: 3.8 mmol/L (ref 3.5–5.1)
Sodium: 139 mmol/L (ref 135–145)
TOTAL PROTEIN: 7.3 g/dL (ref 6.0–8.3)
Total Bilirubin: 0.5 mg/dL (ref 0.3–1.2)

## 2014-08-01 MED ORDER — AMOXICILLIN 500 MG PO CAPS: 500.0000 mg | ORAL_CAPSULE | Freq: Three times a day (TID) | ORAL | Status: DC

## 2014-08-01 MED ORDER — PROMETHAZINE HCL 25 MG PO TABS: 25.0000 mg | ORAL_TABLET | Freq: Four times a day (QID) | ORAL | Status: DC | PRN

## 2014-08-01 MED ORDER — IBUPROFEN 800 MG PO TABS: 800.0000 mg | ORAL_TABLET | Freq: Three times a day (TID) | ORAL | Status: DC | PRN

## 2014-08-01 NOTE — ED Notes (Signed)
Patient c/o generalized body aches with nausea, vomiting, and chills x4 days. Patient also reports a productive cough with thick green sputum.

## 2014-08-01 NOTE — Discharge Instructions (Signed)
Drink plenty of fluids.  Tylenol for fever

## 2014-08-01 NOTE — ED Provider Notes (Signed)
CSN: [id number]     Arrival date & time 08/01/14  1414 History  This chart was scribed for Maudry Diego, MD by Tula Nakayama, ED Scribe. This patient was seen in room [facility] and the patient's care was started at 4:12 PM.    Chief Complaint  Patient presents with  . Emesis   Patient is a 34 y.o. male presenting with vomiting. The history is provided by the patient. No language interpreter was used.  Emesis Severity:  Moderate Timing:  Intermittent Number of daily episodes:  4 Quality:  Unable to specify Progression:  Unable to specify Chronicity:  New Recent urination:  Normal Context: not post-tussive   Relieved by:  Nothing Worsened by:  Nothing tried Ineffective treatments:  None tried Associated symptoms: chills, cough, fever and myalgias   Associated symptoms: no abdominal pain, no diarrhea and no headaches     HPI Comments: Steve Henry is a 34 y.o. male who presents to the Emergency Department after 4 episodes of vomiting that started earlier today. He states productive cough with green sputum that started 2 days ago, subjective fever, generalized body achesand chills as associated symptoms. Pt tried Tylenol Cold and Flu PTA with no relief.  He did not get a flu shot this year. Pt denies diarrhea as an associated symptom.  History reviewed. No pertinent past medical history. History reviewed. No pertinent past surgical history. History reviewed. No pertinent family history. History  Substance Use Topics  . Smoking status: Former Smoker -- 0.50 packs/day for 16 years    Types: Cigarettes    Quit date: 12/22/2013  . Smokeless tobacco: Never Used  . Alcohol Use: No    Review of Systems  Constitutional: Positive for fever and chills. Negative for appetite change and fatigue.  HENT: Negative for congestion, ear discharge and sinus pressure.   Eyes: Negative for discharge.  Respiratory: Positive for cough.   Cardiovascular: Negative for chest pain.   Gastrointestinal: Positive for vomiting. Negative for abdominal pain and diarrhea.  Genitourinary: Negative for frequency and hematuria.  Musculoskeletal: Positive for myalgias. Negative for back pain.  Skin: Negative for rash.  Neurological: Negative for seizures and headaches.  Psychiatric/Behavioral: Negative for hallucinations.  All other systems reviewed and are negative.  Allergies  Review of patient's allergies indicates no known allergies.  Home Medications   Prior to Admission medications   Medication Sig Start Date End Date Taking? Authorizing Provider  clotrimazole (LOTRIMIN) 1 % cream Apply to affected area 2 times daily 12/31/13   Tammy L. Triplett, PA-C  HYDROcodone-acetaminophen (NORCO/VICODIN) 5-325 MG per tablet Take one-two tabs po q 4-6 hrs prn pain 12/31/13   Tammy L. Triplett, PA-C  naproxen (NAPROSYN) 500 MG tablet Take 1 tablet (500 mg total) by mouth 2 (two) times daily. 12/31/13   Tammy L. Triplett, PA-C  traMADol (ULTRAM) 50 MG tablet Take 1 tablet (50 mg total) by mouth every 6 (six) hours as needed. 03/24/14   Maudry Diego, MD   BP 90/65 mmHg  Pulse 91  Temp(Src) 98.1 F (36.7 C) (Oral)  Resp 18  Ht 5\' 2"  (1.575 m)  Wt 179 lb (81.194 kg)  BMI 32.73 kg/m2  SpO2 100% Physical Exam  Constitutional: He is oriented to person, place, and time. He appears well-developed.  HENT:  Head: Normocephalic.  Eyes: Conjunctivae and EOM are normal. No scleral icterus.  Neck: Neck supple. No thyromegaly present.  Cardiovascular: Normal rate and regular rhythm.  Exam reveals no gallop and no  friction rub.   No murmur heard. Pulmonary/Chest: No stridor. He has no wheezes. He has no rales. He exhibits no tenderness.  Abdominal: He exhibits no distension. There is no tenderness. There is no rebound.  Musculoskeletal: Normal range of motion. He exhibits no edema.  Lymphadenopathy:    He has no cervical adenopathy.  Neurological: He is oriented to person, place, and  time. He exhibits normal muscle tone. Coordination normal.  Skin: No rash noted. No erythema.  Psychiatric: He has a normal mood and affect. His behavior is normal.  Nursing note and vitals reviewed.   ED Course  Procedures (including critical care time) DIAGNOSTIC STUDIES: Oxygen Saturation is 100% on RA, normal by my interpretation.    COORDINATION OF CARE: 4:18 PM Discussed treatment plan with pt at bedside and pt agreed to plan.  Labs Review Labs Reviewed  COMPREHENSIVE METABOLIC PANEL - Abnormal; Notable for the following:    Glucose, Bld 102 (*)    GFR calc non Af Amer 79 (*)    All other components within normal limits  CBC WITH DIFFERENTIAL/PLATELET    Imaging Review No results found.   EKG Interpretation None      MDM   Final diagnoses:  None   Bronchitis,  tx with amox, phenergan and motrin   The chart was scribed for me under my direct supervision.  I personally performed the history, physical, and medical decision making and all procedures in the evaluation of this patient.Maudry Diego, MD 08/01/14 [phone]

## 2014-08-07 ENCOUNTER — Emergency Department (HOSPITAL_COMMUNITY)
Admission: EM | Admit: 2014-08-07 | Discharge: 2014-08-07 | Disposition: A | Discharge status: Home / Self Care | Payer: Self-pay | Attending: Emergency Medicine | Admitting: Emergency Medicine

## 2014-08-07 ENCOUNTER — Emergency Department (HOSPITAL_COMMUNITY): Payer: Self-pay

## 2014-08-07 ENCOUNTER — Encounter (HOSPITAL_COMMUNITY): Payer: Self-pay

## 2014-08-07 DIAGNOSIS — G5601 Carpal tunnel syndrome, right upper limb: Secondary | ICD-10-CM | POA: Insufficient documentation

## 2014-08-07 DIAGNOSIS — M778 Other enthesopathies, not elsewhere classified: Secondary | ICD-10-CM | POA: Insufficient documentation

## 2014-08-07 DIAGNOSIS — Z87891 Personal history of nicotine dependence: Secondary | ICD-10-CM | POA: Insufficient documentation

## 2014-08-07 DIAGNOSIS — M779 Enthesopathy, unspecified: Secondary | ICD-10-CM

## 2014-08-07 MED ORDER — KETOROLAC TROMETHAMINE 10 MG PO TABS
10.0000 mg | ORAL_TABLET | Freq: Once | ORAL | Status: AC
  Administered 2014-08-07: 10 mg via ORAL
  Filled 2014-08-07: qty 1

## 2014-08-07 MED ORDER — IBUPROFEN 800 MG PO TABS: 800.0000 mg | ORAL_TABLET | Freq: Three times a day (TID) | ORAL | Status: DC

## 2014-08-07 MED ORDER — HYDROCODONE-ACETAMINOPHEN 7.5-325 MG PO TABS: 1.0000 | ORAL_TABLET | ORAL | Status: DC | PRN

## 2014-08-07 MED ORDER — HYDROCODONE-ACETAMINOPHEN 5-325 MG PO TABS
2.0000 | ORAL_TABLET | Freq: Once | ORAL | Status: AC
  Administered 2014-08-07: 2 via ORAL
  Filled 2014-08-07: qty 2

## 2014-08-07 NOTE — ED Notes (Signed)
Pt reports was at work and suddenly r hand "locked up on me."  Reports hand pain ever since.   Denies injury.

## 2014-08-07 NOTE — Discharge Instructions (Signed)
Your x-rays are negative for fracture or dislocation. Your examination suggest carpal tunnel syndrome, and/or tendinitis. Please use your splint and sling until you're seen by Dr. Luna Glasgow, or the orthopedic specialist of your choice. Please use ibuprofen 3 times daily with food. Please use Norco every 4 hours as needed for pain. Norco may cause drowsiness, please use with caution. Carpal Tunnel Syndrome The carpal tunnel is an area under the skin of the palm of your hand. Nerves, blood vessels, and strong tissues (tendons) pass through the tunnel. The tunnel can become puffy (swollen). If this happens, a nerve can be pinched in the wrist. This causes carpal tunnel syndrome.  HOME CARE  Take all medicine as told by your doctor.  If you were given a splint, wear it as told. Wear it at night or at times when your doctor told you to.  Rest your wrist from the activity that causes your pain.  Put ice on your wrist after long periods of wrist activity.  Put ice in a plastic bag.  Place a towel between your skin and the bag.  Leave the ice on for 15-20 minutes, 03-04 times a day.  Keep all doctor visits as told. GET HELP RIGHT AWAY IF:  You have new problems you cannot explain.  Your problems get worse and medicine does not help. MAKE SURE YOU:   Understand these instructions.  Will watch your condition.  Will get help right away if you are not doing well or get worse. Document Released: 05/18/2011 Document Revised: 08/21/2011 Document Reviewed: 05/18/2011 [facility] Patient Information 2015 [place], [place]. This information is not intended to replace advice given to you by your health care provider. Make sure you discuss any questions you have with your health care provider.  Tendinitis  Tendinitis is redness, soreness, and puffiness (inflammation) of the tendons. Tendons are band-like tissues that connect muscle to bone. Tendinitis often happens in the shoulders, heels, or elbows. It  might happen if your job involves doing the same motions over and over. HOME CARE  Use a sling or splint as told by your doctor.  Put ice on the injured area.  Put ice in a plastic bag.  Place a towel between your skin and the bag.  Leave the ice on for 15-20 minutes, 03-04 times a day.  Avoid using your injured arm or leg until the pain goes away.  Do gentle exercises only as told by your doctor. Stop exercises if the pain gets worse, unless your doctor tells you otherwise.  Only take medicines as told by your doctor. GET HELP RIGHT AWAY IF:  Your pain and puffiness get worse.  You have new problems, such as loss of feeling (numbness) in the hands. MAKE SURE YOU:  Understand these instructions.  Will watch your condition.  Will get help right away if you are not doing well or get worse. Document Released: 09/08/2010 Document Revised: 08/21/2011 Document Reviewed: 09/08/2010 [facility] Patient Information 2015 [place], [place]. This information is not intended to replace advice given to you by your health care provider. Make sure you discuss any questions you have with your health care provider.

## 2014-08-07 NOTE — ED Notes (Signed)
Patient with no complaints at this time. Respirations even and unlabored. Skin warm/dry. Discharge instructions reviewed with patient at this time. Patient given opportunity to voice concerns/ask questions. Patient discharged at this time and left Emergency Department with steady gait.   

## 2014-08-07 NOTE — ED Provider Notes (Signed)
CSN: [id number]     Arrival date & time 08/07/14  0930 History   First MD Initiated Contact with Patient 08/07/14 [phone]     Chief Complaint  Patient presents with  . Hand Pain     (Consider location/radiation/quality/duration/timing/severity/associated sxs/prior Treatment) Patient is a 34 y.o. male presenting with hand pain. The history is provided by the patient.  Hand Pain This is a new problem. The current episode started in the past 7 days. The problem occurs intermittently. The problem has been gradually worsening. Associated symptoms include arthralgias and numbness. Pertinent negatives include no abdominal pain, chest pain, coughing, neck pain or rash. Exacerbated by: squeezing and movement. He has tried nothing for the symptoms. The treatment provided no relief.    History reviewed. No pertinent past medical history. History reviewed. No pertinent past surgical history. No family history on file. History  Substance Use Topics  . Smoking status: Former Smoker -- 0.50 packs/day for 16 years    Types: Cigarettes    Quit date: 12/22/2013  . Smokeless tobacco: Never Used  . Alcohol Use: No    Review of Systems  Constitutional: Negative for activity change.       All ROS Neg except as noted in HPI  HENT: Negative for nosebleeds.   Eyes: Negative for photophobia and discharge.  Respiratory: Negative for cough, shortness of breath and wheezing.   Cardiovascular: Negative for chest pain and palpitations.  Gastrointestinal: Negative for abdominal pain and blood in stool.  Genitourinary: Negative for dysuria, frequency and hematuria.  Musculoskeletal: Positive for arthralgias. Negative for back pain and neck pain.  Skin: Negative.  Negative for rash.  Neurological: Positive for numbness. Negative for dizziness, seizures and speech difficulty.  Psychiatric/Behavioral: Negative for hallucinations and confusion.      Allergies  Review of patient's allergies indicates no known  allergies.  Home Medications   Prior to Admission medications   Medication Sig Start Date End Date Taking? Authorizing Provider  amoxicillin (AMOXIL) 500 MG capsule Take 1 capsule (500 mg total) by mouth 3 (three) times daily. Patient not taking: Reported on 08/07/2014 08/01/14   Maudry Diego, MD  clotrimazole (LOTRIMIN) 1 % cream Apply to affected area 2 times daily Patient not taking: Reported on 08/07/2014 12/31/13   Tammy L. Triplett, PA-C  HYDROcodone-acetaminophen (NORCO/VICODIN) 5-325 MG per tablet Take one-two tabs po q 4-6 hrs prn pain Patient not taking: Reported on 08/07/2014 12/31/13   Tammy L. Triplett, PA-C  ibuprofen (ADVIL,MOTRIN) 800 MG tablet Take 1 tablet (800 mg total) by mouth every 8 (eight) hours as needed for moderate pain. Patient not taking: Reported on 08/07/2014 08/01/14   Maudry Diego, MD  naproxen (NAPROSYN) 500 MG tablet Take 1 tablet (500 mg total) by mouth 2 (two) times daily. Patient not taking: Reported on 08/07/2014 12/31/13   Tammy L. Triplett, PA-C  promethazine (PHENERGAN) 25 MG tablet Take 1 tablet (25 mg total) by mouth every 6 (six) hours as needed for nausea or vomiting. Patient not taking: Reported on 08/07/2014 08/01/14   Maudry Diego, MD  traMADol (ULTRAM) 50 MG tablet Take 1 tablet (50 mg total) by mouth every 6 (six) hours as needed. Patient not taking: Reported on 08/07/2014 03/24/14   Maudry Diego, MD   BP 130/93 mmHg  Pulse 76  Temp(Src) 98.6 F (37 C) (Oral)  Resp 20  Ht 5\' 2"  (1.575 m)  Wt 175 lb (79.379 kg)  BMI 32.00 kg/m2  SpO2 99% Physical Exam  Constitutional: He is oriented to person, place, and time. He appears well-developed and well-nourished.  Non-toxic appearance.  HENT:  Head: Normocephalic.  Right Ear: Tympanic membrane and external ear normal.  Left Ear: Tympanic membrane and external ear normal.  Eyes: EOM and lids are normal. Pupils are equal, round, and reactive to light.  Neck: Normal range of motion. Neck  supple. Carotid bruit is not present.  Cardiovascular: Normal rate, regular rhythm, normal heart sounds, intact distal pulses and normal pulses.   Pulmonary/Chest: Breath sounds normal. No respiratory distress.  Abdominal: Soft. Bowel sounds are normal. There is no tenderness. There is no guarding.  Musculoskeletal: Normal range of motion.  Lymphadenopathy:       Head (right side): No submandibular adenopathy present.       Head (left side): No submandibular adenopathy present.    He has no cervical adenopathy.  Neurological: He is alert and oriented to person, place, and time. He has normal strength. No cranial nerve deficit or sensory deficit.  Skin: Skin is warm and dry.  Psychiatric: He has a normal mood and affect. His speech is normal.  Nursing note and vitals reviewed.   ED Course  Procedures (including critical care time) Labs Review Labs Reviewed - No data to display  Imaging Review No results found.   EKG Interpretation None      MDM  Vital signs stable. Pulse ox 99% on room air. Xray of the right hnd and wrist neg for fx or dislocation. Exam suggest carpal tunnel syndrome.  Pt fitted wth wrist splint and sling,. Rx for norco and ibuprofen given to the patient. Pt referred to orthopedics.    Final diagnoses:  None    *I have reviewed nursing notes, vital signs, and all appropriate lab and imaging results for this patient.**    Lenox Ahr, PA-C 08/09/14 [place], DO 08/10/14 [phone]

## 2014-09-23 ENCOUNTER — Emergency Department (HOSPITAL_COMMUNITY)
Admission: EM | Admit: 2014-09-23 | Discharge: 2014-09-23 | Disposition: A | Discharge status: Home / Self Care | Payer: Self-pay | Attending: Emergency Medicine | Admitting: Emergency Medicine

## 2014-09-23 ENCOUNTER — Emergency Department (HOSPITAL_COMMUNITY): Payer: Self-pay

## 2014-09-23 ENCOUNTER — Encounter (HOSPITAL_COMMUNITY): Payer: Self-pay | Admitting: *Deleted

## 2014-09-23 DIAGNOSIS — Y990 Civilian activity done for income or pay: Secondary | ICD-10-CM | POA: Insufficient documentation

## 2014-09-23 DIAGNOSIS — Z7952 Long term (current) use of systemic steroids: Secondary | ICD-10-CM | POA: Insufficient documentation

## 2014-09-23 DIAGNOSIS — S60211A Contusion of right wrist, initial encounter: Secondary | ICD-10-CM | POA: Insufficient documentation

## 2014-09-23 DIAGNOSIS — Y9389 Activity, other specified: Secondary | ICD-10-CM | POA: Insufficient documentation

## 2014-09-23 DIAGNOSIS — Y9289 Other specified places as the place of occurrence of the external cause: Secondary | ICD-10-CM | POA: Insufficient documentation

## 2014-09-23 DIAGNOSIS — Z87891 Personal history of nicotine dependence: Secondary | ICD-10-CM | POA: Insufficient documentation

## 2014-09-23 DIAGNOSIS — Z792 Long term (current) use of antibiotics: Secondary | ICD-10-CM | POA: Insufficient documentation

## 2014-09-23 DIAGNOSIS — W228XXA Striking against or struck by other objects, initial encounter: Secondary | ICD-10-CM | POA: Insufficient documentation

## 2014-09-23 MED ORDER — HYDROCODONE-ACETAMINOPHEN 5-325 MG PO TABS: 1.0000 | ORAL_TABLET | ORAL | Status: DC | PRN

## 2014-09-23 MED ORDER — HYDROCODONE-ACETAMINOPHEN 5-325 MG PO TABS
1.0000 | ORAL_TABLET | Freq: Once | ORAL | Status: AC
  Administered 2014-09-23: 1 via ORAL
  Filled 2014-09-23: qty 1

## 2014-09-23 MED ORDER — PREDNISONE 50 MG PO TABS
60.0000 mg | ORAL_TABLET | Freq: Once | ORAL | Status: AC
  Administered 2014-09-23: 60 mg via ORAL
  Filled 2014-09-23 (×2): qty 1

## 2014-09-23 MED ORDER — PREDNISONE 10 MG PO TABS: ORAL_TABLET | ORAL | Status: DC

## 2014-09-23 NOTE — ED Notes (Signed)
Pain rt hand  And wrist.  Pt injured at work.

## 2014-09-23 NOTE — ED Notes (Signed)
Patient transported to X-ray 

## 2014-09-23 NOTE — Discharge Instructions (Signed)
Contusion °A contusion is a deep bruise. Contusions are the result of an injury that caused bleeding under the skin. The contusion may turn blue, purple, or yellow. Minor injuries will give you a painless contusion, but more severe contusions may stay painful and swollen for a few weeks.  °CAUSES  °A contusion is usually caused by a blow, trauma, or direct force to an area of the body. °SYMPTOMS  °· Swelling and redness of the injured area. °· Bruising of the injured area. °· Tenderness and soreness of the injured area. °· Pain. °DIAGNOSIS  °The diagnosis can be made by taking a history and physical exam. An X-ray, CT scan, or MRI may be needed to determine if there were any associated injuries, such as fractures. °TREATMENT  °Specific treatment will depend on what area of the body was injured. In general, the best treatment for a contusion is resting, icing, elevating, and applying cold compresses to the injured area. Over-the-counter medicines may also be recommended for pain control. Ask your caregiver what the best treatment is for your contusion. °HOME CARE INSTRUCTIONS  °· Put ice on the injured area. °¨ Put ice in a plastic bag. °¨ Place a towel between your skin and the bag. °¨ Leave the ice on for 15-20 minutes, 3-4 times a day, or as directed by your health care provider. °· Only take over-the-counter or prescription medicines for pain, discomfort, or fever as directed by your caregiver. Your caregiver may recommend avoiding anti-inflammatory medicines (aspirin, ibuprofen, and naproxen) for 48 hours because these medicines may increase bruising. °· Rest the injured area. °· If possible, elevate the injured area to reduce swelling. °SEEK IMMEDIATE MEDICAL CARE IF:  °· You have increased bruising or swelling. °· You have pain that is getting worse. °· Your swelling or pain is not relieved with medicines. °MAKE SURE YOU:  °· Understand these instructions. °· Will watch your condition. °· Will get help right  away if you are not doing well or get worse. °Document Released: 03/08/2005 Document Revised: 06/03/2013 Document Reviewed: 04/03/2011 °[employer]® Patient Information ©2015 [employer]. This information is not intended to replace advice given to you by your health care provider. Make sure you discuss any questions you have with your health care provider. ° °

## 2014-09-24 NOTE — ED Provider Notes (Signed)
CSN: [id number]     Arrival date & time 09/23/14  1808 History   First MD Initiated Contact with Patient 09/23/14 1905     Chief Complaint  Patient presents with  . Hand Injury     (Consider location/radiation/quality/duration/timing/severity/associated sxs/prior Treatment) The history is provided by the patient.   Steve Henry is a 34 y.o. right handed male presenting with pain across his dorsal right wrist after being struck by a metal cement slide at work just prior to arrival.  He reports constant pain without radiation which is worse with movement and palpation. He has chronic right hand pain and and numbness associated with carpal tunnel syndrome and is awaiting orthopedic evaluation of this once his insurance is available.  He has had no treatment for today's injury.     History reviewed. No pertinent past medical history. History reviewed. No pertinent past surgical history. History reviewed. No pertinent family history. History  Substance Use Topics  . Smoking status: Former Smoker -- 0.50 packs/day for 16 years    Types: Cigarettes    Quit date: 12/22/2013  . Smokeless tobacco: Never Used  . Alcohol Use: No    Review of Systems  Constitutional: Negative for fever.  Musculoskeletal: Positive for arthralgias. Negative for myalgias and joint swelling.  Neurological: Negative for weakness and numbness.      Allergies  Review of patient's allergies indicates no known allergies.  Home Medications   Prior to Admission medications   Medication Sig Start Date End Date Taking? Authorizing Provider  amoxicillin (AMOXIL) 500 MG capsule Take 1 capsule (500 mg total) by mouth 3 (three) times daily. Patient not taking: Reported on 08/07/2014 08/01/14   Milton Ferguson, MD  clotrimazole (LOTRIMIN) 1 % cream Apply to affected area 2 times daily Patient not taking: Reported on 08/07/2014 12/31/13   Patrice Paradise, PA-C  HYDROcodone-acetaminophen (NORCO/VICODIN) 5-325 MG per  tablet Take 1 tablet by mouth every 4 (four) hours as needed. 09/23/14   Evalee Jefferson, PA-C  ibuprofen (ADVIL,MOTRIN) 800 MG tablet Take 1 tablet (800 mg total) by mouth 3 (three) times daily. 08/07/14   Lily Kocher, PA-C  naproxen (NAPROSYN) 500 MG tablet Take 1 tablet (500 mg total) by mouth 2 (two) times daily. Patient not taking: Reported on 08/07/2014 12/31/13   Tammi Triplett, PA-C  predniSONE (DELTASONE) 10 MG tablet 6, 5, 4, 3, 2 then 1 tablet by mouth daily for 6 days total. 09/23/14   Evalee Jefferson, PA-C  promethazine (PHENERGAN) 25 MG tablet Take 1 tablet (25 mg total) by mouth every 6 (six) hours as needed for nausea or vomiting. Patient not taking: Reported on 08/07/2014 08/01/14   Milton Ferguson, MD  traMADol (ULTRAM) 50 MG tablet Take 1 tablet (50 mg total) by mouth every 6 (six) hours as needed. Patient not taking: Reported on 08/07/2014 03/24/14   Milton Ferguson, MD   BP 133/88 mmHg  Pulse 87  Temp(Src) 98.7 F (37.1 C) (Oral)  Resp 20  Ht 5\' 2"  (1.575 m)  Wt 180 lb (81.647 kg)  BMI 32.91 kg/m2  SpO2 98% Physical Exam  Constitutional: He appears well-developed and well-nourished.  HENT:  Head: Atraumatic.  Neck: Normal range of motion.  Cardiovascular:  Pulses equal bilaterally  Musculoskeletal: He exhibits tenderness.       Right wrist: He exhibits bony tenderness and swelling. He exhibits no effusion, no crepitus and no deformity.  Ttp, mild edema dorsal right wrist. No bruising. Radial pulse intact, distant sensation normal and cap  refill less than 2 sec.  Right thumb is held in flexion at the mc joint which he states is chronic.  Neurological: He is alert. He has normal strength. He displays normal reflexes. No sensory deficit.  Skin: Skin is warm and dry.  Psychiatric: He has a normal mood and affect.    ED Course  Procedures (including critical care time) Labs Review Labs Reviewed - No data to display  Imaging Review Dg Wrist Complete Right  09/23/2014   CLINICAL  DATA:  Right wrist pain after injury.  EXAM: RIGHT WRIST - COMPLETE 3+ VIEW  COMPARISON:  08/07/2014  FINDINGS: No fracture or dislocation. The alignment and joint spaces are maintained. Scaphoid is intact. No focal soft tissue abnormality.  IMPRESSION: No fracture or dislocation of the right wrist.   Electronically Signed   By: Jeb Levering M.D.   On: 09/23/2014 19:19     EKG Interpretation None      MDM   Final diagnoses:  Wrist contusion, right, initial encounter    Patients labs and/or radiological studies were reviewed and considered during the medical decision making and disposition process.  Results were also discussed with patient. Pt with direct blow to right wrist, xrays negative.  He was placed in a velcro wrist brace for compression. Ice, elevation, prednisone, hydrocodone prescribed. Advised f/u with ortho as planned prn.  The patient appears reasonably screened and/or stabilized for discharge and I doubt any other medical condition or other [facility] requiring further screening, evaluation, or treatment in the ED at this time prior to discharge.     Evalee Jefferson, PA-C 09/25/14 [place], MD 09/26/14 [phone]

## 2015-02-02 ENCOUNTER — Emergency Department (HOSPITAL_COMMUNITY)
Admission: EM | Admit: 2015-02-02 | Discharge: 2015-02-02 | Disposition: A | Discharge status: Home / Self Care | Payer: Self-pay | Attending: Emergency Medicine | Admitting: Emergency Medicine

## 2015-02-02 ENCOUNTER — Encounter (HOSPITAL_COMMUNITY): Payer: Self-pay | Admitting: *Deleted

## 2015-02-02 DIAGNOSIS — G5602 Carpal tunnel syndrome, left upper limb: Secondary | ICD-10-CM | POA: Insufficient documentation

## 2015-02-02 DIAGNOSIS — G5603 Carpal tunnel syndrome, bilateral upper limbs: Secondary | ICD-10-CM

## 2015-02-02 DIAGNOSIS — Z87891 Personal history of nicotine dependence: Secondary | ICD-10-CM | POA: Insufficient documentation

## 2015-02-02 DIAGNOSIS — G5601 Carpal tunnel syndrome, right upper limb: Secondary | ICD-10-CM | POA: Insufficient documentation

## 2015-02-02 MED ORDER — DICLOFENAC SODIUM 75 MG PO TBEC: 75.0000 mg | DELAYED_RELEASE_TABLET | Freq: Two times a day (BID) | ORAL | Status: DC

## 2015-02-02 MED ORDER — ACETAMINOPHEN-CODEINE #3 300-30 MG PO TABS
2.0000 | ORAL_TABLET | Freq: Once | ORAL | Status: AC
Start: 2015-02-02 — End: 2015-02-02
  Administered 2015-02-02: 2 via ORAL
  Filled 2015-02-02: qty 2

## 2015-02-02 MED ORDER — DEXAMETHASONE 4 MG PO TABS: 4.0000 mg | ORAL_TABLET | Freq: Two times a day (BID) | ORAL | Status: DC

## 2015-02-02 MED ORDER — INDOMETHACIN 25 MG PO CAPS
25.0000 mg | ORAL_CAPSULE | Freq: Once | ORAL | Status: AC
  Administered 2015-02-02: 25 mg via ORAL
  Filled 2015-02-02: qty 1

## 2015-02-02 MED ORDER — ACETAMINOPHEN-CODEINE #3 300-30 MG PO TABS: 1.0000 | ORAL_TABLET | Freq: Four times a day (QID) | ORAL | Status: DC | PRN

## 2015-02-02 MED ORDER — DEXAMETHASONE SODIUM PHOSPHATE 4 MG/ML IJ SOLN
8.0000 mg | Freq: Once | INTRAMUSCULAR | Status: AC
  Administered 2015-02-02: 8 mg via INTRAMUSCULAR
  Filled 2015-02-02: qty 2

## 2015-02-02 NOTE — ED Provider Notes (Signed)
CSN: [id number]     Arrival date & time 02/02/15  1651 History   First MD Initiated Contact with Patient 02/02/15 1843     Chief Complaint  Patient presents with  . Wrist Pain     (Consider location/radiation/quality/duration/timing/severity/associated sxs/prior Treatment) HPI Comments: Patient is a 34 year old male who presents to the emergency department with both right and left hand and wrist pain. The patient states that he was diagnosed in April here in the emergency department with carpal Tunnel Changes. He Was Fitted with a Splint and Advised to See Orthopedics, He States That at That Time He Could Not Afford the Co-Pay and Did Not See the Orthopedic Specialist. He Was Out Of Work and Was Eventually Fired Because He Could Not Use His Hands As Prescribed for the Job. The Patient States Now the Right Hand Is Worse, with Some Drawing of His Thumb, As Well As Numbness of the Some of the Fingers of the Right Hand. He States He's Beginning to Feel Some Numbness and Tingling of the Middle Finger of the Left Hand, and Some Pain Going from the Wrist to the Mid Forearm. He States That He Has Tried Over-The-Counter Medications, He Has Tried Speaking with Orthoptist for Assistance with Resources, and a Has Not Had Any Success with Either of These, and the Pain Is Getting Worse. He Presents Now for Assistance with This Issue.  Patient is a 34 y.o. male presenting with wrist pain. The history is provided by the patient.  Wrist Pain Associated symptoms include arthralgias and numbness.    Past Medical History  Diagnosis Date  . Carpal tunnel syndrome    History reviewed. No pertinent past surgical history. No family history on file. Social History  Substance Use Topics  . Smoking status: Former Smoker -- 0.50 packs/day for 16 years    Types: Cigarettes    Quit date: 12/22/2013  . Smokeless tobacco: Never Used  . Alcohol Use: No    Review of Systems  Musculoskeletal: Positive for  arthralgias.  Neurological: Positive for numbness.  All other systems reviewed and are negative.     Allergies  Review of patient's allergies indicates no known allergies.  Home Medications   Prior to Admission medications   Medication Sig Start Date End Date Taking? Authorizing Provider  HYDROcodone-acetaminophen (NORCO/VICODIN) 5-325 MG per tablet Take 1 tablet by mouth every 4 (four) hours as needed. Patient not taking: Reported on 02/02/2015 09/23/14   Evalee Jefferson, PA-C   BP 128/84 mmHg  Pulse 108  Temp(Src) 98.6 F (37 C)  Resp 20  Ht 5\' 2"  (1.575 m)  Wt 170 lb (77.111 kg)  BMI 31.09 kg/m2  SpO2 99% Physical Exam  Constitutional: He is oriented to person, place, and time. He appears well-developed and well-nourished.  Non-toxic appearance.  HENT:  Head: Normocephalic.  Right Ear: Tympanic membrane and external ear normal.  Left Ear: Tympanic membrane and external ear normal.  Eyes: EOM and lids are normal. Pupils are equal, round, and reactive to light.  Neck: Normal range of motion. Neck supple. Carotid bruit is not present.  Cardiovascular: Normal rate, regular rhythm, normal heart sounds, intact distal pulses and normal pulses.   Pulmonary/Chest: Breath sounds normal. No respiratory distress.  Abdominal: Soft. Bowel sounds are normal. There is no tenderness. There is no guarding.  Musculoskeletal: Normal range of motion.  There is a positive Tinel's sign on the right and the left, right greater than left. There is pain and difficulty with flexion  and extension of the thumb. There is decreased range of motion of the fingers of the right hand. There is pain with attempted flexion and extension of the wrist, and pain with attempted range of motion of the right shoulder. There is some difficulty with attempted flexion and extension of the left third finger. There is some pain with attempted flexion and extension of the left wrist.  Lymphadenopathy:       Head (right side): No  submandibular adenopathy present.       Head (left side): No submandibular adenopathy present.    He has no cervical adenopathy.  Neurological: He is alert and oriented to person, place, and time. He has normal strength. No cranial nerve deficit or sensory deficit.  The patient states that there is a sensation of pins and needles on the right greater than on the left. There is no noted atrophy of the hand or the thumb muscles of the forearm on the right or the left.  Skin: Skin is warm and dry.  Psychiatric: He has a normal mood and affect. His speech is normal.  Nursing note and vitals reviewed.   ED Course  Procedures (including critical care time) Labs Review Labs Reviewed - No data to display  Imaging Review No results found. I have personally reviewed and evaluated these images and lab results as part of my medical decision-making.   EKG Interpretation None      MDM  The patient has been diagnosed with carpal tunnel issues in the past. He has not been evaluated by orthopedics to this point. Today's examination exhibits worsening of the carpal tunnel problems on the right, and what appears to be the beginning of carpal tunnel issues on the left. The patient is fitted with right and left cockup splints. Prescription for Decadron, Tylenol codeine, and diclofenac given to the patient. The patient is again advised very strongly to see an orthopedic specialist as sone as possible to prevent complications from his carpal tunnel issue.    Final diagnoses:  None    *I have reviewed nursing notes, vital signs, and all appropriate lab and imaging results for this patient.[address], PA-C [id number]/41 [id number]  Delora Fuel, MD 53/20/23 [id number]

## 2015-02-02 NOTE — ED Notes (Signed)
Pt is having right hand pain related to carpal tunnel. Pt's right thumb is drawn into his palm. Pt states he has been seen for the same before. NAD noted

## 2015-02-02 NOTE — Discharge Instructions (Signed)
The orthopedic clinics at [facility], or [facility] may be of help to you. Please use caution when using the Tylenol codeine, this medication may cause drowsiness. Please take the diclofenac and  Carpal Tunnel Syndrome The carpal tunnel is a narrow area located on the palm side of your wrist. The tunnel is formed by the wrist bones and ligaments. Nerves, blood vessels, and tendons pass through the carpal tunnel. Repeated wrist motion or certain diseases may cause swelling within the tunnel. This swelling pinches the main nerve in the wrist (median nerve) and causes the painful hand and arm condition called carpal tunnel syndrome. CAUSES   Repeated wrist motions.  Wrist injuries.  Certain diseases like arthritis, diabetes, alcoholism, hyperthyroidism, and kidney failure.  Obesity.  Pregnancy. SYMPTOMS   A "pins and needles" feeling in your fingers or hand, especially in your thumb, index and middle fingers.  Tingling or numbness in your fingers or hand.  An aching feeling in your entire arm, especially when your wrist and elbow are bent for long periods of time.  Wrist pain that goes up your arm to your shoulder.  Pain that goes down into your palm or fingers.  A weak feeling in your hands. DIAGNOSIS  Your health care provider will take your history and perform a physical exam. An electromyography test may be needed. This test measures electrical signals sent out by your nerves into the muscles. The electrical signals are usually slowed by carpal tunnel syndrome. You may also need X-rays. TREATMENT  Carpal tunnel syndrome may clear up by itself. Your health care provider may recommend a wrist splint or medicine such as a nonsteroidal anti-inflammatory medicine. Cortisone injections may help. Sometimes, surgery may be needed to free the pinched nerve.  HOME CARE INSTRUCTIONS   Take all medicine as directed by your health care provider. Only take  over-the-counter or prescription medicines for pain, discomfort, or fever as directed by your health care provider.  If you were given a splint to keep your wrist from bending, wear it as directed. It is important to wear the splint at night. Wear the splint for as long as you have pain or numbness in your hand, arm, or wrist. This may take 1 to 2 months.  Rest your wrist from any activity that may be causing your pain. If your symptoms are work-related, you may need to talk to your employer about changing to a job that does not require using your wrist.  Put ice on your wrist after long periods of wrist activity.  Put ice in a plastic bag.  Place a towel between your skin and the bag.  Leave the ice on for 15-20 minutes, 03-04 times a day.  Keep all follow-up visits as directed by your health care provider. This includes any orthopedic referrals, physical therapy, and rehabilitation. Any delay in getting necessary care could result in a delay or failure of your condition to heal. SEEK IMMEDIATE MEDICAL CARE IF:   You have new, unexplained symptoms.  Your symptoms get worse and are not helped or controlled with medicines. MAKE SURE YOU:   Understand these instructions.  Will watch your condition.  Will get help right away if you are not doing well or get worse. Document Released: 05/26/2000 Document Revised: 10/13/2013 Document Reviewed: 04/14/2011 [facility] Patient Information 2015 [place], [place]. This information is not intended to replace advice given to you by your health care provider. Make sure you discuss any questions you have  with your health care provider.  the Decadron daily with food until all taken.

## 2015-02-08 ENCOUNTER — Telehealth: Payer: Self-pay | Admitting: Orthopedic Surgery

## 2015-02-08 NOTE — Telephone Encounter (Signed)
Spoke with patient regarding appointment following Emergency Room visit; scheduled appointment / reviewed self-pay protocol.

## 2015-03-02 ENCOUNTER — Encounter: Payer: Self-pay | Admitting: Orthopedic Surgery

## 2015-03-02 ENCOUNTER — Ambulatory Visit (INDEPENDENT_AMBULATORY_CARE_PROVIDER_SITE_OTHER): Payer: Self-pay | Admitting: Orthopedic Surgery

## 2015-03-02 VITALS — BP 125/81 | Ht 62.0 in | Wt 170.0 lb

## 2015-03-02 DIAGNOSIS — M25531 Pain in right wrist: Secondary | ICD-10-CM

## 2015-03-02 DIAGNOSIS — M79641 Pain in right hand: Secondary | ICD-10-CM

## 2015-03-02 DIAGNOSIS — M654 Radial styloid tenosynovitis [de Quervain]: Secondary | ICD-10-CM

## 2015-03-02 MED ORDER — HYDROCODONE-ACETAMINOPHEN 5-325 MG PO TABS: 1.0000 | ORAL_TABLET | Freq: Four times a day (QID) | ORAL | Status: DC | PRN

## 2015-03-02 MED ORDER — PREDNISONE 10 MG (48) PO TBPK: ORAL_TABLET | ORAL | Status: DC

## 2015-03-02 NOTE — Progress Notes (Signed)
Patient ID: Steve Henry, male   DOB: 11-Jun-1981, 34 y.o.   MRN: [id number]  New patient   Chief Complaint  Patient presents with  . Follow-up    er follow up bilateral hand pain, Rt>Lt     Steve Henry is a 34 y.o. male.   HPI 34 year old male presents with pain in his right hand and forearm and also pain in his left thumb.  I'll address the right hand and forearm first.  Basically he was injured at work while working for [employer]. Something fell on his forearm. He went to the ER. He had x-rays that were negative. He was placed in a splint of some kind and told not to work for 3 days. He went back to work the next day. But was eventually unable to work because she can't open and close his hand well and is having pain in his hand which feels like pressure and then pain in the thenar eminence, first extensor compartment, flexor carpi radialis tendon. He cannot make a full opposition of thumb to small finger.  The left thumb started recently separate incident and issue. He has difficulty flexing extending and has pain in the first extensor compartment   Review of Systems Review of Systems  Constitutional: Negative for fever, chills and malaise/fatigue.  Skin: Negative for itching and rash.  Neurological: Negative for tingling, sensory change, focal weakness and weakness.     Past Medical History  Diagnosis Date  . Carpal tunnel syndrome     No past surgical history on file.  No family history on file.  Social History Social History  Substance Use Topics  . Smoking status: Former Smoker -- 0.50 packs/day for 16 years    Types: Cigarettes    Quit date: 12/22/2013  . Smokeless tobacco: Never Used  . Alcohol Use: No    No Known Allergies  Current Outpatient Prescriptions  Medication Sig Dispense Refill  . HYDROcodone-acetaminophen (NORCO/VICODIN) 5-325 MG per tablet Take 1 tablet by mouth every 6 (six) hours as needed for moderate pain. 60 tablet 0  .  predniSONE (STERAPRED UNI-PAK 48 TAB) 10 MG (48) TBPK tablet Use as directed 48 tablet 0   No current facility-administered medications for this visit.       Physical Exam Blood pressure 125/81, height 5\' 2"  (1.575 m), weight 170 lb (77.111 kg). Physical Exam The patient is well developed well nourished and well groomed. Orientation to person place and time is normal  Mood is pleasant. Ambulatory status is normal without a limp Skin remains intact without laceration ulceration or erythema on either hand Right upper extremity is tender at the thenar eminence he cannot oppose the thumb and small finger he cannot close the hand fully has what feels like pressure in the hand and forearm  Left thumb tenderness over the A1 pulley and first extensor compartment positive ulnar deviation test for de Quervain's  Left hand wrist joint range of motion stability otherwise normal  Neurovascular exam remains intact; I do not see evidence of carpal tunnel    Data Reviewed  Plain films right upper extremity normal personal reviewed and independently interpreted  Assessment  Left hand extensor compartment #1 tenosynovitis de Quervain's syndrome  Right hand diagnosis unknown needs further subspecialty follow-up  Plan   The patient has a problem with his right hand and upper extremity which is out of my scope of practice. 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 He was fine until a large object fell onto his right forearm. He went back to work earlier then I would've advised at that time. He needed rest elevation in functional splinting followed by occupational therapy.  We  will advise his lawyer to seek a hand upper extremity specialist as this type of problem is not part of my general orthopedic practice

## 2015-03-02 NOTE — Patient Instructions (Signed)
Recommend referral to hand specialist ( to be set up by workman's comp and attorney)   Out of work until seen by hand specialist  Medication sent to your pharmacy  Brace left hand x 6 weeks

## 2015-03-22 ENCOUNTER — Emergency Department (HOSPITAL_COMMUNITY)
Admission: EM | Admit: 2015-03-22 | Discharge: 2015-03-22 | Disposition: A | Discharge status: Home / Self Care | Payer: Self-pay | Attending: Physician Assistant | Admitting: Physician Assistant

## 2015-03-22 ENCOUNTER — Encounter (HOSPITAL_COMMUNITY): Payer: Self-pay | Admitting: Emergency Medicine

## 2015-03-22 DIAGNOSIS — G8929 Other chronic pain: Secondary | ICD-10-CM | POA: Insufficient documentation

## 2015-03-22 DIAGNOSIS — Z87891 Personal history of nicotine dependence: Secondary | ICD-10-CM | POA: Insufficient documentation

## 2015-03-22 DIAGNOSIS — M79641 Pain in right hand: Secondary | ICD-10-CM | POA: Insufficient documentation

## 2015-03-22 DIAGNOSIS — M79642 Pain in left hand: Secondary | ICD-10-CM | POA: Insufficient documentation

## 2015-03-22 MED ORDER — DICLOFENAC SODIUM 75 MG PO TBEC: 75.0000 mg | DELAYED_RELEASE_TABLET | Freq: Two times a day (BID) | ORAL | Status: DC

## 2015-03-22 MED ORDER — TRAMADOL HCL 50 MG PO TABS: 50.0000 mg | ORAL_TABLET | Freq: Four times a day (QID) | ORAL | Status: DC | PRN

## 2015-03-22 MED ORDER — HYDROCODONE-ACETAMINOPHEN 5-325 MG PO TABS
1.0000 | ORAL_TABLET | Freq: Once | ORAL | Status: AC
  Administered 2015-03-22: 1 via ORAL
  Filled 2015-03-22: qty 1

## 2015-03-22 NOTE — ED Notes (Signed)
Pt states that's he was seen for same thing here and was dx with carpel tunnel and tendonitis - was seen by orthopedic MD and informed that he had tendonitis ,but not carpel tunnel- was also instructed to see a hand specialist - but pt doesn't have insurance -- Pt stated that he is here today today due to continued pain , hard to do day to day things

## 2015-03-22 NOTE — ED Provider Notes (Signed)
CSN: [id number]     Arrival date & time 03/22/15  1348 History  By signing my name below, I, Steve Henry, attest that this documentation has been prepared under the direction and in the presence of Steve Williamsen, PA-C.  Electronically Signed: Hilda Henry, ED Scribe. 03/22/2015. 3:45 PM.  Chief Complaint  Patient presents with  . Hand Pain      The history is provided by the patient. No language interpreter was used.   HPI Comments: Steve Henry is a 34 y.o. male with chronic bilateral wrist pain and flexure contraction of the right thumb, presents to the Emergency Department complaining of constant, worsening bilateral hand and wrist pain that is worst on the right hand that has been present since February. Pt states that he had an accident while he was at work in February where a cinder block fell on his right forearm, and reports pain has been chronic since then. Pt is here today requesting pain medication and another brace for his hands, wrists. Pt states he saw Dr. Aline Brochure, an orthopedic specialist, recently about the issue, and was told that he had tendinitis in both of his hands. Pt was referred to a hand specialist, but has not been seen yet because he does not have insurance. Pt states he is trying to save up money to be able to afford a consultation. Pt states pain worsens with gripping or extension of his fingers, and improves at a functional position. Pt taking OTC medicine 4-5x per day with little relief. He denies change in his symptoms, discoloration, swelling, neck pain, and weakness of the fingers    History reviewed. No pertinent past medical history. History reviewed. No pertinent past surgical history. History reviewed. No pertinent family history. Social History  Substance Use Topics  . Smoking status: Former Smoker -- 0.50 packs/day for 16 years    Types: Cigarettes    Quit date: 12/22/2013  . Smokeless tobacco: Never Used  . Alcohol Use: No    Review of  Systems  Constitutional: Negative for fever and chills.  Musculoskeletal: Positive for arthralgias (bilateral wrist pain). Negative for myalgias, joint swelling and neck pain.  Skin: Negative for color change and wound.  Neurological: Negative for numbness.  All other systems reviewed and are negative.     Allergies  Review of patient's allergies indicates no known allergies.  Home Medications   Prior to Admission medications   Medication Sig Start Date End Date Taking? Authorizing Provider  HYDROcodone-acetaminophen (NORCO/VICODIN) 5-325 MG per tablet Take 1 tablet by mouth every 6 (six) hours as needed for moderate pain. Patient not taking: Reported on 03/22/2015 03/02/15   Carole Civil, MD  predniSONE (STERAPRED UNI-PAK 48 TAB) 10 MG (48) TBPK tablet Use as directed Patient not taking: Reported on 03/22/2015 03/02/15   Carole Civil, MD   BP 141/88 mmHg  Pulse 99  Temp(Src) 98.2 F (36.8 C) (Oral)  Resp 18  Ht 5\' 2"  (1.575 m)  Wt 170 lb (77.111 kg)  BMI 31.09 kg/m2  SpO2 100% Physical Exam  Constitutional: He is oriented to person, place, and time. He appears well-developed and well-nourished.  HENT:  Head: Normocephalic and atraumatic.  Cardiovascular: Normal rate, regular rhythm and intact distal pulses.   Pulmonary/Chest: Effort normal. No respiratory distress.  Musculoskeletal: He exhibits tenderness. He exhibits no edema.  Diffuse tenderness bilateral wrists. Has flexure contracture of right thumb that is chronic No erythema or edema of the hands or fingers Radial pulse and  distal sensation intact, CR< 2 sec  Neurological: He is alert and oriented to person, place, and time. Coordination normal.  Skin: Skin is warm and dry.  Psychiatric: He has a normal mood and affect.  Nursing note and vitals reviewed.   ED Course  Procedures (including critical care time)  DIAGNOSTIC STUDIES: Oxygen Saturation is 100% on room air, normal by my interpretation.     COORDINATION OF CARE: 3:44 PM Discussed treatment plan with pt at bedside and pt agreed to plan.   Labs Review Labs Reviewed - No data to display  Imaging Review No results found. I have personally reviewed and evaluated these images and lab results as part of my medical decision-making.   EKG Interpretation None      MDM   Final diagnoses:  Bilateral hand pain chronic    Pt is well appearing, vitals stable.  Bilateral hand pain is a chronic issue, has seen local orthopedics 9/20 for same and has been referred to hand specialist.  Came to ED requesting pain control until he can see the specialist.  No concerning sx's for infectious process.  NV intact.  Given wrist brace for comfort, rx for diclofenac and ultram.  He appears stable for d/c  I personally performed the services described in this documentation, which was scribed in my presence. The recorded information has been reviewed and is accurate.    Kem Parkinson, PA-C 03/25/15 [place], MD 03/27/15 [phone]

## 2015-05-29 ENCOUNTER — Emergency Department (HOSPITAL_COMMUNITY)
Admission: EM | Admit: 2015-05-29 | Discharge: 2015-05-29 | Disposition: A | Discharge status: Home / Self Care | Payer: Self-pay | Attending: Emergency Medicine | Admitting: Emergency Medicine

## 2015-05-29 ENCOUNTER — Encounter (HOSPITAL_COMMUNITY): Payer: Self-pay

## 2015-05-29 DIAGNOSIS — G8929 Other chronic pain: Secondary | ICD-10-CM | POA: Insufficient documentation

## 2015-05-29 DIAGNOSIS — Z791 Long term (current) use of non-steroidal anti-inflammatories (NSAID): Secondary | ICD-10-CM | POA: Insufficient documentation

## 2015-05-29 DIAGNOSIS — M25531 Pain in right wrist: Secondary | ICD-10-CM | POA: Insufficient documentation

## 2015-05-29 DIAGNOSIS — L738 Other specified follicular disorders: Secondary | ICD-10-CM

## 2015-05-29 DIAGNOSIS — M25539 Pain in unspecified wrist: Secondary | ICD-10-CM

## 2015-05-29 DIAGNOSIS — L731 Pseudofolliculitis barbae: Secondary | ICD-10-CM | POA: Insufficient documentation

## 2015-05-29 DIAGNOSIS — M25532 Pain in left wrist: Secondary | ICD-10-CM | POA: Insufficient documentation

## 2015-05-29 DIAGNOSIS — Z87891 Personal history of nicotine dependence: Secondary | ICD-10-CM | POA: Insufficient documentation

## 2015-05-29 MED ORDER — KETOROLAC TROMETHAMINE 10 MG PO TABS
10.0000 mg | ORAL_TABLET | Freq: Once | ORAL | Status: AC
  Administered 2015-05-29: 10 mg via ORAL
  Filled 2015-05-29: qty 1

## 2015-05-29 MED ORDER — NAPROXEN 500 MG PO TABS: 500.0000 mg | ORAL_TABLET | Freq: Two times a day (BID) | ORAL | Status: DC

## 2015-05-29 MED ORDER — SULFAMETHOXAZOLE-TRIMETHOPRIM 800-160 MG PO TABS: 1.0000 | ORAL_TABLET | Freq: Two times a day (BID) | ORAL | Status: AC

## 2015-05-29 MED ORDER — PREDNISONE 10 MG PO TABS
ORAL_TABLET | ORAL | Status: DC
Start: 2015-05-29 — End: 2015-09-22

## 2015-05-29 NOTE — ED Notes (Signed)
Pt states understanding of care given and follow up instructions 

## 2015-05-29 NOTE — Discharge Instructions (Signed)
Wrist Splint A wrist splint holds your wrist in a set position so that it does not move. A wrist splint supports your wrist but is flexible. It can be removed or loosened. You may need a wrist splint if you hurt your wrist or have swelling in your wrist. A wrist splint can:  Support your wrist.  Protect a wrist injury.  Prevent another wrist injury.  Keep your wrist from moving.  Reduce pain.  Help your wrist heal. RISKS AND COMPLICATIONS If you wear your splint too tight or have a lot of swelling, it can reduce the blood supply to your wrist or hand. This can cause a condition called compartment syndrome. It can be dangerous and cause lasting damage. Symptoms are:  Pain in your wrist that is getting worse.  Tingling and numbness.  Changes in skin color (paleness or a bluish color).  Cold fingers. Other risks of wearing a splint may be:   Skin irritation that can cause:  Itching.  Rash.  Skin sores.  Skin infection.  Wrist stiffness. This can happen if you have worn a splint for a long time. HOW TO USE YOUR WRIST SPLINT Your wrist splint should be tight enough to support your wrist. It should not cut off your blood supply. Your doctor will tell you how to wear your wrist splint and for how long.  Follow all your doctor's directions.  Take medicine only as told by your doctor.  Keep your wrist above the level of your heart (elevated) while resting.  Ice may help reduce pain and swelling.  Place ice in a plastic bag.  Place a towel between your splint and the bag.  Leave the ice on for 20 minutes, 2-3 times a day.  Do not get your splint wet.  Do not push objects under your splint to scratch your skin.  Loosen your splint if it feels too tight. Talk to your doctor if you have questions about how tight to wear the splint.  Keep all follow-up visits as told by your doctor. This is important. GET HELP IF:   You have wrist pain or swelling that does not go  away.  The skin around or under your splint becomes red, itchy, or moist.  You have chills or fever.  Your splint feels too tight or too loose.  Your splint breaks. GET HELP RIGHT AWAY IF:  You have:  Pain in your wrist that is getting worse.  Tingling and numbness.  Changes in skin color (paleness or a bluish color).  Cold fingers. MAKE SURE YOU:   Understand these instructions.  Will watch your condition.  Will get help right away if you are not doing well or get worse.   This information is not intended to replace advice given to you by your health care provider. Make sure you discuss any questions you have with your health care provider.   Document Released: 11/15/2007 Document Revised: 06/19/2014 Document Reviewed: 09/09/2013 Elsevier Interactive Patient Education [employer].

## 2015-05-29 NOTE — ED Notes (Signed)
I have a rash on my face and having pain in my wrists and hands.

## 2015-05-29 NOTE — ED Provider Notes (Signed)
CSN: [id number]     Arrival date & time 05/29/15  2012 History   First MD Initiated Contact with Patient 05/29/15 2028     Chief Complaint  Patient presents with  . Rash     (Consider location/radiation/quality/duration/timing/severity/associated sxs/prior Treatment) HPI   Steve Henry is a 34 y.o. male who presents to the Emergency Department complaining of painful rash to his face and upper neck.  Rash has been present for several days.  He states that he noticed the rash after someone shaved his face with electric clippers.  He states that he has been applying hydrogen peroxide and an OTC anti-fungal cream without relief.  He denies drainage, swelling, itching, difficulty swallowing or throat pain.  He also requests evaluation of worsening of his chronic, bilateral wrist pain. He states that he is receiving disability for a work related accident that involved an injury to his right wrist and thumb that resulted in limited movement of the right thumb and left wrist pain that resulted from over use secondary to the right wrist injury.  He states that he was seen by a local orthopedic doctor in September of this year and advised to follow-up with a hand specialist.  He states that he has not done this because he is waiting for his financial settlement. He states the pain to both wrists is the same as previous.  He denies swelling, discoloration, weakness.      History reviewed. No pertinent past medical history. History reviewed. No pertinent past surgical history. No family history on file. Social History  Substance Use Topics  . Smoking status: Former Smoker -- 0.50 packs/day for 16 years    Types: Cigarettes    Quit date: 12/22/2013  . Smokeless tobacco: Never Used  . Alcohol Use: No    Review of Systems  Constitutional: Negative for fever and chills.  Gastrointestinal: Negative for nausea and vomiting.  Genitourinary: Negative for dysuria and difficulty urinating.   Musculoskeletal: Positive for arthralgias (bilateral wrist pain). Negative for joint swelling.  Skin: Positive for rash. Negative for color change and wound.       Red "bumps" to lower face and upper neck  Neurological: Negative for weakness and numbness.  All other systems reviewed and are negative.     Allergies  Review of patient's allergies indicates no known allergies.  Home Medications   Prior to Admission medications   Medication Sig Start Date End Date Taking? Authorizing Provider  diclofenac (VOLTAREN) 75 MG EC tablet Take 1 tablet (75 mg total) by mouth 2 (two) times daily. Take with food 03/22/15   Lariah Fleer, PA-C  HYDROcodone-acetaminophen (NORCO/VICODIN) 5-325 MG per tablet Take 1 tablet by mouth every 6 (six) hours as needed for moderate pain. Patient not taking: Reported on 03/22/2015 03/02/15   Carole Civil, MD  predniSONE (STERAPRED UNI-PAK 48 TAB) 10 MG (48) TBPK tablet Use as directed Patient not taking: Reported on 03/22/2015 03/02/15   Carole Civil, MD  traMADol (ULTRAM) 50 MG tablet Take 1 tablet (50 mg total) by mouth every 6 (six) hours as needed. 03/22/15   Reality Dejonge, PA-C   BP 131/91 mmHg  Pulse 87  Temp(Src) 98.7 F (37.1 C) (Oral)  Resp 16  Ht 5\' 2"  (1.575 m)  Wt 83.915 kg  BMI 33.83 kg/m2  SpO2 98% Physical Exam  Constitutional: He is oriented to person, place, and time. He appears well-developed and well-nourished. No distress.  HENT:  Head: Normocephalic and atraumatic.  Mouth/Throat:  Oropharynx is clear and moist.  Neck: Normal range of motion. Neck supple.  Cardiovascular: Normal rate, regular rhythm, normal heart sounds and intact distal pulses.   No murmur heard. Pulmonary/Chest: Effort normal and breath sounds normal. No respiratory distress.  Musculoskeletal: He exhibits tenderness. He exhibits no edema.  ttp of the bilateral distal wrists and tenderness with ROM of the right thumb and palpation of the thenar  eminence. Limited finger opposition with right thumb and fifth finger.  No edema, erythema, distal sensation intact, CR < 2 sec, compartments soft.  Lymphadenopathy:    He has no cervical adenopathy.  Neurological: He is alert and oriented to person, place, and time. He exhibits normal muscle tone. Coordination normal.  Skin: Skin is warm. Rash noted. There is erythema.  Scattered, small erythematous papules to the lower face and upper neck  Nursing note and vitals reviewed.   ED Course  Procedures (including critical care time) Labs Review Labs Reviewed - No data to display  Imaging Review No results found. I have personally reviewed and evaluated these images and lab results as part of my medical decision-making.    MDM   Final diagnoses:  Folliculitis barbae  Wrist pain, chronic, unspecified laterality    Pt reviewed on the [place] narcotic database.  Received #60 hydrocodone 5/325mg  on 03/02/15. Nothing recent on file.  Pt had neg XR of right wrist in April 2016.    Bilateral wrist pain which is chronic.  No concerning sx's for septic joint.  NV intact.  Wrist splint given for comfort.  Pt appears stable for d/c and advised to arrange orthopedic f/u and establish PMD.       Kem Parkinson, PA-C 05/29/15 2127  Francine Graven, DO 05/31/15 0001

## 2015-08-28 ENCOUNTER — Emergency Department (HOSPITAL_COMMUNITY)
Admission: EM | Admit: 2015-08-28 | Discharge: 2015-08-28 | Disposition: A | Discharge status: Home / Self Care | Payer: Self-pay | Attending: Emergency Medicine | Admitting: Emergency Medicine

## 2015-08-28 ENCOUNTER — Encounter (HOSPITAL_COMMUNITY): Payer: Self-pay | Admitting: Emergency Medicine

## 2015-08-28 DIAGNOSIS — L739 Follicular disorder, unspecified: Secondary | ICD-10-CM | POA: Insufficient documentation

## 2015-08-28 DIAGNOSIS — Z87891 Personal history of nicotine dependence: Secondary | ICD-10-CM | POA: Insufficient documentation

## 2015-08-28 DIAGNOSIS — L738 Other specified follicular disorders: Secondary | ICD-10-CM

## 2015-08-28 HISTORY — DX: Pain in unspecified hand: M79.643

## 2015-08-28 HISTORY — DX: Other chronic pain: G89.29

## 2015-08-28 HISTORY — DX: Radial styloid tenosynovitis (de quervain): M65.4

## 2015-08-28 HISTORY — DX: Other cervical disc degeneration, unspecified cervical region: M50.30

## 2015-08-28 MED ORDER — HYDROCODONE-ACETAMINOPHEN 5-325 MG PO TABS: 1.0000 | ORAL_TABLET | ORAL | Status: DC | PRN

## 2015-08-28 MED ORDER — CEPHALEXIN 500 MG PO CAPS: 500.0000 mg | ORAL_CAPSULE | Freq: Four times a day (QID) | ORAL | Status: DC

## 2015-08-28 MED ORDER — HYDROCODONE-ACETAMINOPHEN 5-325 MG PO TABS
1.0000 | ORAL_TABLET | Freq: Once | ORAL | Status: AC
  Administered 2015-08-28: 1 via ORAL
  Filled 2015-08-28: qty 1

## 2015-08-28 MED ORDER — LIDOCAINE HCL (PF) 1 % IJ SOLN
INTRAMUSCULAR | Status: AC
  Administered 2015-08-28: 5 mL
  Filled 2015-08-28: qty 5

## 2015-08-28 MED ORDER — CEFTRIAXONE SODIUM 1 G IJ SOLR
1.0000 g | Freq: Once | INTRAMUSCULAR | Status: AC
  Administered 2015-08-28: 1 g via INTRAMUSCULAR
  Filled 2015-08-28: qty 10

## 2015-08-28 NOTE — ED Notes (Signed)
Pt c/o rash on the left side of his face, pain down his right arm and bilateral hand pain x 2 days.

## 2015-08-28 NOTE — ED Provider Notes (Signed)
CSN: [id number]     Arrival date & time 08/28/15  1932 History   First MD Initiated Contact with Patient 08/28/15 1956     Chief Complaint  Patient presents with  . Rash     (Consider location/radiation/quality/duration/timing/severity/associated sxs/prior Treatment) The history is provided by the patient.   Steve Henry is a 35 y.o. male presenting with a painful rash on his right face and neck which started 2 days ago. He endorses sharp stabbing pain across his jaw line, neck and around his ear extending to the right lateral scalp.  His pain is constant, sharp, stabbing but also with a burning quality.  He has developed swelling and redness within his beard area along with multiple painful bumps.  He has a history of tinea barbae on the left face which is resolved at present, and he states this new breakout is more painful then previous.  He denies fevers or chills.  He also endorses an itchy rash on his hands which has been present for some time, has eczema history.  He has found no alleviators.    Past Medical History  Diagnosis Date  . Chronic hand pain   . De Quervain's tenosynovitis   . DDD (degenerative disc disease), cervical    History reviewed. No pertinent past surgical history. History reviewed. No pertinent family history. Social History  Substance Use Topics  . Smoking status: Former Smoker -- 0.50 packs/day for 16 years    Types: Cigarettes    Quit date: 12/22/2013  . Smokeless tobacco: Never Used  . Alcohol Use: No    Review of Systems  Constitutional: Negative for fever and chills.  Respiratory: Negative for shortness of breath and wheezing.   Skin: Positive for rash.       Negative except as mentioned in HPI.    Neurological: Negative for numbness.      Allergies  Review of patient's allergies indicates no known allergies.  Home Medications   Prior to Admission medications   Medication Sig Start Date End Date Taking? Authorizing Provider   cephALEXin (KEFLEX) 500 MG capsule Take 1 capsule (500 mg total) by mouth 4 (four) times daily. 08/28/15   Evalee Jefferson, PA-C  diclofenac (VOLTAREN) 75 MG EC tablet Take 1 tablet (75 mg total) by mouth 2 (two) times daily. Take with food 03/22/15   Tammy Triplett, PA-C  HYDROcodone-acetaminophen (NORCO/VICODIN) 5-325 MG tablet Take 1 tablet by mouth every 4 (four) hours as needed. 08/28/15   Evalee Jefferson, PA-C  naproxen (NAPROSYN) 500 MG tablet Take 1 tablet (500 mg total) by mouth 2 (two) times daily with a meal. 05/29/15   Tammy Triplett, PA-C  predniSONE (DELTASONE) 10 MG tablet Take 6 tablets day one, 5 tablets day two, 4 tablets day three, 3 tablets day four, 2 tablets day five, then 1 tablet day six 05/29/15   Tammy Triplett, PA-C  traMADol (ULTRAM) 50 MG tablet Take 1 tablet (50 mg total) by mouth every 6 (six) hours as needed. 03/22/15   Tammy Triplett, PA-C   BP 146/86 mmHg  Pulse 111  Temp(Src) 98.6 F (37 C) (Oral)  Resp 16  Ht 5\' 2"  (1.575 m)  Wt 79.379 kg  BMI 32.00 kg/m2  SpO2 100% Physical Exam  Constitutional: He appears well-developed and well-nourished. No distress.  HENT:  Head: Normocephalic.  Neck: Neck supple.  Cardiovascular: Normal rate.   Pulmonary/Chest: Effort normal. He has no wheezes.  Musculoskeletal: Normal range of motion. He exhibits no edema.  Skin:  Rash noted.  Fine, rough feel to lateral fingers bilateral hands without obvious rash. Pustules noted right jaw line extending to right upper neck.  Multiple areas of scabbing present with several active pustules. No vesicles, no erythema.  Skin of cheek, forehead and scalp normal appearing.      ED Course  Procedures (including critical care time) Labs Review Labs Reviewed - No data to display  Imaging Review No results found. I have personally reviewed and evaluated these images and lab results as part of my medical decision-making.   EKG Interpretation None      MDM   Final diagnoses:   Folliculitis barbae    Pt also seen by Dr. Roderic Palau who helped formulate plan.  Does not appear to be shingles, exam most consistent with folliculitis.  He was given rocephin IM, prescribed keflex and hydrocodone for pain relief.  Warm compresses, avoid direct contact with children (has newborn at home).  Prn f/u.  Referral given to [facility] to establish pcp.    Evalee Jefferson, PA-C 08/28/15 [id number]  Milton Ferguson, MD 08/28/15 [phone]

## 2015-09-20 ENCOUNTER — Observation Stay (HOSPITAL_COMMUNITY)
Admission: EM | Admit: 2015-09-20 | Discharge: 2015-09-21 | Disposition: A | Discharge status: Home / Self Care | Payer: Self-pay | Attending: Internal Medicine | Admitting: Internal Medicine

## 2015-09-20 ENCOUNTER — Encounter (HOSPITAL_COMMUNITY): Payer: Self-pay | Admitting: *Deleted

## 2015-09-20 ENCOUNTER — Emergency Department (HOSPITAL_COMMUNITY): Payer: Self-pay

## 2015-09-20 DIAGNOSIS — Z87891 Personal history of nicotine dependence: Secondary | ICD-10-CM | POA: Insufficient documentation

## 2015-09-20 DIAGNOSIS — R200 Anesthesia of skin: Principal | ICD-10-CM | POA: Insufficient documentation

## 2015-09-20 DIAGNOSIS — Z79899 Other long term (current) drug therapy: Secondary | ICD-10-CM | POA: Insufficient documentation

## 2015-09-20 LAB — CBC WITH DIFFERENTIAL/PLATELET
Basophils Absolute: 0 10*3/uL (ref 0.0–0.1)
Basophils Relative: 0 %
Eosinophils Absolute: 0 10*3/uL (ref 0.0–0.7)
Eosinophils Relative: 0 %
HEMATOCRIT: 41.1 % (ref 39.0–52.0)
HEMOGLOBIN: 13.9 g/dL (ref 13.0–17.0)
LYMPHS ABS: 1.5 10*3/uL (ref 0.7–4.0)
Lymphocytes Relative: 16 %
MCH: 27.6 pg (ref 26.0–34.0)
MCHC: 33.8 g/dL (ref 30.0–36.0)
MCV: 81.7 fL (ref 78.0–100.0)
MONOS PCT: 3 %
Monocytes Absolute: 0.3 10*3/uL (ref 0.1–1.0)
NEUTROS ABS: 7.8 10*3/uL — AB (ref 1.7–7.7)
NEUTROS PCT: 81 %
Platelets: 328 10*3/uL (ref 150–400)
RBC: 5.03 MIL/uL (ref 4.22–5.81)
RDW: 13 % (ref 11.5–15.5)
WBC: 9.6 10*3/uL (ref 4.0–10.5)

## 2015-09-20 LAB — COMPREHENSIVE METABOLIC PANEL
ALK PHOS: 102 U/L (ref 38–126)
ALT: 19 U/L (ref 17–63)
ANION GAP: 9 (ref 5–15)
AST: 20 U/L (ref 15–41)
Albumin: 4.4 g/dL (ref 3.5–5.0)
BUN: 18 mg/dL (ref 6–20)
CALCIUM: 9.3 mg/dL (ref 8.9–10.3)
CO2: 21 mmol/L — ABNORMAL LOW (ref 22–32)
Chloride: 108 mmol/L (ref 101–111)
Creatinine, Ser: 1.02 mg/dL (ref 0.61–1.24)
GLUCOSE: 137 mg/dL — AB (ref 65–99)
Potassium: 4.1 mmol/L (ref 3.5–5.1)
Sodium: 138 mmol/L (ref 135–145)
TOTAL PROTEIN: 8.4 g/dL — AB (ref 6.5–8.1)
Total Bilirubin: 0.3 mg/dL (ref 0.3–1.2)

## 2015-09-20 LAB — URINALYSIS, ROUTINE W REFLEX MICROSCOPIC
Bilirubin Urine: NEGATIVE
GLUCOSE, UA: NEGATIVE mg/dL
Hgb urine dipstick: NEGATIVE
KETONES UR: NEGATIVE mg/dL
LEUKOCYTES UA: NEGATIVE
NITRITE: NEGATIVE
PROTEIN: NEGATIVE mg/dL
Specific Gravity, Urine: 1.02 (ref 1.005–1.030)
pH: 6.5 (ref 5.0–8.0)

## 2015-09-20 LAB — TSH: TSH: 1.176 u[IU]/mL (ref 0.350–4.500)

## 2015-09-20 MED ORDER — ACETAMINOPHEN 500 MG PO TABS
1000.0000 mg | ORAL_TABLET | Freq: Once | ORAL | Status: AC
  Administered 2015-09-20: 1000 mg via ORAL
  Filled 2015-09-20: qty 2

## 2015-09-20 NOTE — ED Notes (Signed)
Pt comes in with bilateral hand and feet pain that has lasted the last few months, worsening yesterday. Pt states both his feet are feeling numb and his right hand. Pt has no deficits upon triage.

## 2015-09-20 NOTE — ED Provider Notes (Addendum)
CSN: [id number]     Arrival date & time 09/20/15  1848 History  By signing my name below, I, [facility], attest that this documentation has been prepared under the direction and in the presence of Nat Christen, MD. Electronically Signed: Virgel Bouquet, ED Scribe. 09/20/2015. 12:10 AM.   Chief Complaint  Patient presents with  . Extremity Pain  . Numbness   The history is provided by the patient. No language interpreter was used.   HPI Comments: Steve Henry is a 35 y.o. male with a PMHx of chronic hand pain and De Quervain's tenosynovitis who presents to the Emergency Department complaining of constant, gradually worsening bilateral hand and foot numbness, onset 1-2 months ago, worse  last night. Patient states that his feet were numb last night before going to sleep and that his bilateral hands became numb this morning upon waking. He states that numbness has traveled up his feet to his knees and from his hands into his forearms today. He reports shooting pains in his bilateral feet and right hand, weakness shaking of his hands, and difficulty ambulating secondary to weakness. He has taken prednisone without relief. He has seen by Dr. Arther Abbott last week for these symptoms who prescribed him a prednisone taper and advised him to follow-up with a hand specialist. He has not followed-up with the hand specialist due to a lack of insurance.  Denies hx of DM, HTN, cancer, or other chronic conditions. Denies any other symptoms currently.  Past Medical History  Diagnosis Date  . Chronic hand pain   . De Quervain's tenosynovitis   . DDD (degenerative disc disease), cervical    History reviewed. No pertinent past surgical history. No family history on file. Social History  Substance Use Topics  . Smoking status: Former Smoker -- 0.50 packs/day for 16 years    Types: Cigarettes    Quit date: 12/22/2013  . Smokeless tobacco: Never Used  . Alcohol Use: No    Review of  Systems A complete 10 system review of systems was obtained and all systems are negative except as noted in the HPI and PMH.   Allergies  Review of patient's allergies indicates no known allergies.  Home Medications   Prior to Admission medications   Medication Sig Start Date End Date Taking? Authorizing Provider  predniSONE (DELTASONE) 10 MG tablet Take 6 tablets day one, 5 tablets day two, 4 tablets day three, 3 tablets day four, 2 tablets day five, then 1 tablet day six Patient taking differently: Take 10-60 mg by mouth See admin instructions. Take 6 tablets day one, 5 tablets day two, 4 tablets day three, 3 tablets day four, 2 tablets day five, then 1 tablet day six 05/29/15  Yes Tammy Triplett, PA-C  cephALEXin (KEFLEX) 500 MG capsule Take 1 capsule (500 mg total) by mouth 4 (four) times daily. Patient not taking: Reported on 09/20/2015 08/28/15   Evalee Jefferson, PA-C  HYDROcodone-acetaminophen (NORCO/VICODIN) 5-325 MG tablet Take 1 tablet by mouth every 4 (four) hours as needed. Patient not taking: Reported on 09/20/2015 08/28/15   Evalee Jefferson, PA-C   BP 136/78 mmHg  Pulse 88  Temp(Src) 98.3 F (36.8 C) (Oral)  Resp 17  Ht 5\' 1"  (1.549 m)  Wt 180 lb (81.647 kg)  BMI 34.03 kg/m2  SpO2 99% Physical Exam  Constitutional: He is oriented to person, place, and time. He appears well-developed and well-nourished.  HENT:  Head: Normocephalic and atraumatic.  Eyes: Conjunctivae and EOM are normal. Pupils  are equal, round, and reactive to light.  Neck: Normal range of motion. Neck supple.  Cardiovascular: Normal rate and regular rhythm.   Pulmonary/Chest: Effort normal and breath sounds normal.  Abdominal: Soft. Bowel sounds are normal.  Musculoskeletal: Normal range of motion.  Neurological: He is alert and oriented to person, place, and time.  Skin: Skin is warm and dry.  Psychiatric: He has a normal mood and affect. His behavior is normal.  Nursing note and vitals reviewed.   ED Course   Procedures   DIAGNOSTIC STUDIES: Oxygen Saturation is 99% on RA, normal by my interpretation.    COORDINATION OF CARE: 8:30 PM Will order head CT and labs. Discussed treatment plan with pt at bedside and pt agreed to plan.  Labs Review Labs Reviewed  CBC WITH DIFFERENTIAL/PLATELET - Abnormal; Notable for the following:    Neutro Abs 7.8 (*)    All other components within normal limits  COMPREHENSIVE METABOLIC PANEL - Abnormal; Notable for the following:    CO2 21 (*)    Glucose, Bld 137 (*)    Total Protein 8.4 (*)    All other components within normal limits  URINALYSIS, ROUTINE W REFLEX MICROSCOPIC (NOT AT [facility])  TSH    Imaging Review Ct Head Wo Contrast  09/20/2015  CLINICAL DATA:  Bilateral hand and foot numbness for 1-2 months. EXAM: CT HEAD WITHOUT CONTRAST TECHNIQUE: Contiguous axial images were obtained from the base of the skull through the vertex without intravenous contrast. COMPARISON:  None. FINDINGS: The ventricles are normal in size and configuration. No extra-axial fluid collections are identified. The gray-white differentiation is normal. No CT findings for acute intracranial process such as hemorrhage or infarction. No mass lesions. The brainstem and cerebellum are grossly normal. The bony structures are intact. The paranasal sinuses and mastoid air cells are clear. The globes are intact. IMPRESSION: Normal head CT. Electronically Signed   By: Marijo Sanes M.D.   On: 09/20/2015 22:04   I have personally reviewed and evaluated these images and lab results as part of my medical decision-making.   MDM   Final diagnoses:  Numbness   Uncertain etiology of patient's symptom complex. Diagnosis of Guillain-Barr entertained. Discussed with hospitalist Dr. Darrick Meigs.   He recommended tele-neurology consult. Also discussed with Dr. Tomi Bamberger   I personally performed the services described in this documentation, which was scribed in my presence. The recorded information has been  reviewed and is accurate.     Nat Christen, MD 09/21/15 [id number]  Nat Christen, MD 09/21/15 [phone]

## 2015-09-21 ENCOUNTER — Encounter (HOSPITAL_COMMUNITY): Payer: Self-pay | Admitting: *Deleted

## 2015-09-21 ENCOUNTER — Observation Stay (HOSPITAL_COMMUNITY): Payer: Self-pay

## 2015-09-21 DIAGNOSIS — R200 Anesthesia of skin: Secondary | ICD-10-CM | POA: Diagnosis present

## 2015-09-21 DIAGNOSIS — R29898 Other symptoms and signs involving the musculoskeletal system: Secondary | ICD-10-CM

## 2015-09-21 LAB — COMPREHENSIVE METABOLIC PANEL
ALBUMIN: 4 g/dL (ref 3.5–5.0)
ALK PHOS: 89 U/L (ref 38–126)
ALT: 17 U/L (ref 17–63)
AST: 17 U/L (ref 15–41)
Anion gap: 10 (ref 5–15)
BILIRUBIN TOTAL: 0.1 mg/dL — AB (ref 0.3–1.2)
BUN: 19 mg/dL (ref 6–20)
CALCIUM: 8.8 mg/dL — AB (ref 8.9–10.3)
CO2: 22 mmol/L (ref 22–32)
CREATININE: 0.92 mg/dL (ref 0.61–1.24)
Chloride: 106 mmol/L (ref 101–111)
GFR calc Af Amer: >60 mL/min (ref >60)
GFR calc non Af Amer: >60 mL/min (ref >60)
GLUCOSE: 142 mg/dL — AB (ref 65–99)
Potassium: 3.5 mmol/L (ref 3.5–5.1)
Sodium: 138 mmol/L (ref 135–145)
TOTAL PROTEIN: 7.6 g/dL (ref 6.5–8.1)

## 2015-09-21 LAB — CBC
HEMATOCRIT: 37.1 % — AB (ref 39.0–52.0)
HEMOGLOBIN: 12.7 g/dL — AB (ref 13.0–17.0)
MCH: 28.1 pg (ref 26.0–34.0)
MCHC: 34.2 g/dL (ref 30.0–36.0)
MCV: 82.1 fL (ref 78.0–100.0)
Platelets: 299 10*3/uL (ref 150–400)
RBC: 4.52 MIL/uL (ref 4.22–5.81)
RDW: 13.1 % (ref 11.5–15.5)
WBC: 9.3 10*3/uL (ref 4.0–10.5)

## 2015-09-21 LAB — VITAMIN B12: Vitamin B-12: 334 pg/mL (ref 180–914)

## 2015-09-21 LAB — SEDIMENTATION RATE: Sed Rate: 6 mm/hr (ref 0–16)

## 2015-09-21 MED ORDER — HYDROMORPHONE HCL 1 MG/ML IJ SOLN
0.5000 mg | INTRAMUSCULAR | Status: DC | PRN
  Administered 2015-09-21 (×4): 0.5 mg via INTRAVENOUS
  Filled 2015-09-21 (×4): qty 1

## 2015-09-21 MED ORDER — ONDANSETRON HCL 4 MG PO TABS: 4.0000 mg | ORAL_TABLET | Freq: Four times a day (QID) | ORAL | Status: DC | PRN

## 2015-09-21 MED ORDER — ACETAMINOPHEN 325 MG PO TABS: 650.0000 mg | ORAL_TABLET | Freq: Four times a day (QID) | ORAL | Status: DC | PRN

## 2015-09-21 MED ORDER — HYDROMORPHONE HCL 1 MG/ML IJ SOLN
0.5000 mg | Freq: Once | INTRAMUSCULAR | Status: AC
  Administered 2015-09-21: 0.5 mg via INTRAVENOUS
  Filled 2015-09-21: qty 1

## 2015-09-21 MED ORDER — ACETAMINOPHEN 650 MG RE SUPP: 650.0000 mg | Freq: Four times a day (QID) | RECTAL | Status: DC | PRN

## 2015-09-21 MED ORDER — ENOXAPARIN SODIUM 40 MG/0.4ML ~~LOC~~ SOLN: 40.0000 mg | SUBCUTANEOUS | Status: DC

## 2015-09-21 MED ORDER — HYDROMORPHONE HCL 1 MG/ML IJ SOLN
1.0000 mg | INTRAMUSCULAR | Status: AC
  Administered 2015-09-21: 1 mg via INTRAVENOUS
  Filled 2015-09-21: qty 1

## 2015-09-21 MED ORDER — SODIUM CHLORIDE 0.9 % IV SOLN
INTRAVENOUS | Status: DC
  Administered 2015-09-21: 06:00:00 via INTRAVENOUS

## 2015-09-21 MED ORDER — HYDROMORPHONE HCL 1 MG/ML IJ SOLN
0.5000 mg | INTRAMUSCULAR | Status: DC | PRN
Start: 2015-09-21 — End: 2017-12-26

## 2015-09-21 MED ORDER — ONDANSETRON HCL 4 MG/2ML IJ SOLN: 4.0000 mg | Freq: Four times a day (QID) | INTRAMUSCULAR | Status: DC | PRN

## 2015-09-21 NOTE — Progress Notes (Signed)
Report given to [facility] EMT Mahalia Longest. Pt alert and oriented. Pt stable. Vital signs taken, see doc flowsheet. EMTALA and Medical Necessity forms complete. Pt in transit.

## 2015-09-21 NOTE — ED Provider Notes (Signed)
Dr Donivan Scull, neurology [facility], feels patient should be admitted for workup of peripheral neuropathy. She feels patient should have a cervical MRI done, lead level, B-12 and folate levels, RPR, sedimentation rate, and ANA. If Guillain-Barre is a consideration would need an LP.   02:03 Dr Darrick Meigs, states there is no neurologist here until the 17th. He will see patient and decide what needs to be done.  02:39 AM Dr Darrick Meigs, states to admit to med-surg, to observation  Rolland Porter, MD, Barbette Or, MD 09/21/15 0730

## 2015-09-21 NOTE — Progress Notes (Signed)
Received verbal report from MD that patient is being transferred to [facility]. Report called and given to Halford Decamp, [address], [address]. All questions were answered and no further questions at this time. Transport has been arranged with [facility]. Nurse at [facility] can be reached at [phone].

## 2015-09-21 NOTE — H&P (Signed)
PCP:   No PCP Per Patient   Chief Complaint:  Numbness of hands and feet  HPI:  40 old male who  has a past medical history of Chronic hand pain; De Quervain's tenosynovitis; and DDD (degenerative disc disease), cervical. today came to the ED with gradually worsening of bilateral hand and foot numbness. As per patient 3 years ago he developed numbness and pain of right thumb which worsened to include right hand and then it also included his left hand. Over the past 2 days patient notices that he was unable to walk on her feet, and her numbness. So patient came to the hospital for further evaluation. Patient recently was seen by orthopedics Dr. Arther Abbott last week and who prescribed him prednisone taper and to follow-up with hand surgeon. Patient did not follow up with hand surgeon due to lack of health insurance. He denies any slurred speech. No history of stroke or seizures in the past. Patient does have a history of lower back pain, and also cervical degenerative disc disease. Patient was evaluated by [facility] neurology in the ED, who recommends to get MRI of cervical spine and also ordered blood work including ANA, folate RBC, HIV antibody, RPR.  Allergies:  No Known Allergies    Past Medical History  Diagnosis Date  . Chronic hand pain   . De Quervain's tenosynovitis   . DDD (degenerative disc disease), cervical     History reviewed. No pertinent past surgical history.  Prior to Admission medications   Medication Sig Start Date End Date Taking? Authorizing Provider  predniSONE (DELTASONE) 10 MG tablet Take 6 tablets day one, 5 tablets day two, 4 tablets day three, 3 tablets day four, 2 tablets day five, then 1 tablet day six Patient taking differently: Take 10-60 mg by mouth See admin instructions. Take 6 tablets day one, 5 tablets day two, 4 tablets day three, 3 tablets day four, 2 tablets day five, then 1 tablet day six 05/29/15  Yes Tammy Triplett, PA-C  cephALEXin (KEFLEX)  500 MG capsule Take 1 capsule (500 mg total) by mouth 4 (four) times daily. Patient not taking: Reported on 09/20/2015 08/28/15   Evalee Jefferson, PA-C  HYDROcodone-acetaminophen (NORCO/VICODIN) 5-325 MG tablet Take 1 tablet by mouth every 4 (four) hours as needed. Patient not taking: Reported on 09/20/2015 08/28/15   Evalee Jefferson, PA-C    Social History:  reports that he quit smoking about 20 months ago. His smoking use included Cigarettes. He has a 8 pack-year smoking history. He has never used smokeless tobacco. He reports that he does not drink alcohol or use illicit drugs.  No family history of cancer  Filed Weights   09/20/15 1853  Weight: 81.647 kg (180 lb)    All the positives are listed in BOLD  Review of Systems:  HEENT: Headache, blurred vision, runny nose, sore throat Neck: Hypothyroidism, hyperthyroidism,,lymphadenopathy Chest : Shortness of breath, history of COPD, Asthma Heart : Chest pain, history of coronary arterey disease GI:  Nausea, vomiting, diarrhea, constipation, GERD GU: Dysuria, urgency, frequency of urination, hematuria Neuro: Stroke, seizures, syncope Psych: Depression, anxiety, hallucinations   Physical Exam: Blood pressure 138/105, pulse 88, temperature 98.3 F (36.8 C), temperature source Oral, resp. rate 20, height 5\' 1"  (1.549 m), weight 81.647 kg (180 lb), SpO2 99 %. Constitutional:   Patient is a well-developed and well-nourished male in no acute distress and cooperative with exam. Head: Normocephalic and atraumatic Mouth: Mucus membranes moist Eyes: PERRL, EOMI, conjunctivae normal Neck:  Supple, No Thyromegaly Cardiovascular: RRR, S1 normal, S2 normal Pulmonary/Chest: CTAB, no wheezes, rales, or rhonchi Abdominal: Soft. Non-tender, non-distended, bowel sounds are normal, no masses, organomegaly, or guarding present.  Neurological: A&O x3, Strength is normal and symmetric bilaterally, cranial nerve II-XII are grossly intact, no focal motor deficit,   sensory deficit noted in both lower extremities.  Extremities : No Cyanosis, Clubbing or Edema. Straight leg raising positive at 60 on left and 45 on right.   Labs on Admission:  Basic Metabolic Panel:  Recent Labs Lab 09/20/15 2100  NA 138  K 4.1  CL 108  CO2 21*  GLUCOSE 137*  BUN 18  CREATININE 1.02  CALCIUM 9.3   Liver Function Tests:  Recent Labs Lab 09/20/15 2100  AST 20  ALT 19  ALKPHOS 102  BILITOT 0.3  PROT 8.4*  ALBUMIN 4.4   CBC:  Recent Labs Lab 09/20/15 2100  WBC 9.6  NEUTROABS 7.8*  HGB 13.9  HCT 41.1  MCV 81.7  PLT 328    Radiological Exams on Admission: Ct Head Wo Contrast  09/20/2015  CLINICAL DATA:  Bilateral hand and foot numbness for 1-2 months. EXAM: CT HEAD WITHOUT CONTRAST TECHNIQUE: Contiguous axial images were obtained from the base of the skull through the vertex without intravenous contrast. COMPARISON:  None. FINDINGS: The ventricles are normal in size and configuration. No extra-axial fluid collections are identified. The gray-white differentiation is normal. No CT findings for acute intracranial process such as hemorrhage or infarction. No mass lesions. The brainstem and cerebellum are grossly normal. The bony structures are intact. The paranasal sinuses and mastoid air cells are clear. The globes are intact. IMPRESSION: Normal head CT. Electronically Signed   By: Marijo Sanes M.D.   On: 09/20/2015 22:04     Assessment/Plan Active Problems:   Numbness   Numbness Patient presenting with numbness of both hands and feet, no clear etiology of the symptoms. Tele neuro recommended blood work ANA, forward RBC, ANA antibody, RPR as well as MRI cervical spine. Will follow the results. I will also order MRIs lumbar spine to rule out lumbar disc degeneration disease versus disc herniation which could be causing the patient's lower extremity symptoms. If all the above tests are negative, consider neurology consultation at [facility].    Code status: Full code  Family discussion: Admission, patients condition and plan of care including tests being ordered have been discussed with the patient and his fiance bedside    Time Spent on Admission: 60 min  [facility] Pager: [phone] 09/21/2015, 2:57 AM  If 7PM-7AM, please contact night-coverage  [email or web address]  Password [email or web address]

## 2015-09-21 NOTE — Progress Notes (Signed)
LCSW is aware of Clinical Social Work referral. Will defer to financial counselor due to patient not having insurance.  If CSW needs arise, please re-consult!  Lane Hacker, MSW Clinical Social Work: [employer] [phone]

## 2015-09-21 NOTE — Discharge Instructions (Signed)
Tests showed no life-threatening condition. Follow-up with neurologist. Phone number given. Recheck in a.m.Marland Kitchen

## 2015-09-21 NOTE — Discharge Summary (Addendum)
Physician Discharge Summary  Steve Henry [id number] DOB: Mar 16, 1981 DOA: 09/20/2015  PCP: No PCP Per Patient  Admit date: 09/20/2015 Discharge date: 09/21/2015  Time spent: 60 minutes  Recommendations for Outpatient Follow-up:  1. Patient will be transferred to [facility] once bed is available   Discharge Diagnoses:  Active Problems:  Bilateral lower extremity weakness  numbness   Discharge Condition:   Diet recommendation: Heart healthy   Filed Weights   09/20/15 1853  Weight: 81.647 kg (180 lb)    History of present illness:  21 yom with PMH of chronic hand pain, De Quervains tenosynovitis, and DDD cervical presented with complaints of gradually worsening bilateral hand and foot numbness. He reports that over the past two days he has been unable to walk on his feet due to the numbness. Patient recently was seen by orthopedics Dr. Arther Abbott last week and who prescribed him prednisone taper and to follow-up with hand surgeon. Patient did not follow up with hand surgeon due to lack of health insurance. While in the ED, patient was evaluated by [facility] neurology. He has been admitted with recommendations for MRI of cervical spine and also ordered blood work including ANA, folate RBC, HIV antibody, RPR.  Hospital Course:  Patient was admitted to the hospital with progressive lower extremity weakness and numbness. He reports onset of lower extremity weakness approximately 3 days ago. He was having difficulty walking, and felt his feet dragging. He did have several falls. Yesterday, he began having numbness in his feet that extended up to his knees. He also describes paresthesias in his feet that extended up his legs. He is having difficulty walking at this point. He also complained of weakness and numbness as well as paresthesias in his hands bilaterally, but the symptoms have been present for over a year. Patient reports that he was being seen by orthopedics  who felt that he may have bilateral carpal tunnel syndrome. He was prescribed steroids without significant benefit. 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 MRI of the cervical spine and lumbar spine were done which did not show any signal abnormality, did show some degenerative changes with some cord flattening. Case was initially discussed with Dr. Leonel Ramsay, on-call for neurology [facility]. It was felt that patient will likely need further neurology input, but since he may need inpatient EMG, recommendations were to transfer the patient to a tertiary care center. Case was then discussed with Dr. Hall Busing, on-call for neurology at [facility]. Patient has been accepted in transfer to neurology service and [facility] once a bed is available. We'll continue to monitor.  Procedures:  None  Consultations:  None  Discharge Exam: Filed Vitals:   09/21/15 0351 09/21/15 0522  BP: 113/60 153/98  Pulse: 75 76  Temp:  98 F (36.7 C)  Resp: 18 18     General: NAD, looks comfortable  Cardiovascular: RRR, S1, S2   Respiratory: clear bilaterally, No wheezing, rales or rhonchi  Abdomen: soft, non tender, no distention , bowel sounds normal  Musculoskeletal: No edema b/l   Neurology: patient has significant sensory deficits from feet to knees bilaterally, Patellar reflexes intact. Plantar flexion and dorsiflexion is reduced.    Discharge Instructions    Current Discharge Medication List    START taking these medications   Details  enoxaparin (LOVENOX) 40 MG/0.4ML injection Inject 0.4  mLs (40 mg total) into the skin daily. Qty: 0 Syringe    HYDROmorphone (DILAUDID) 1 MG/ML injection Inject 0.5 mLs (0.5 mg total) into the vein every 4 (four) hours as needed for severe pain. Qty: 1 mL, Refills: 0      STOP taking these medications     predniSONE (DELTASONE)  10 MG tablet      cephALEXin (KEFLEX) 500 MG capsule      HYDROcodone-acetaminophen (NORCO/VICODIN) 5-325 MG tablet        No Known Allergies Follow-up Information    Follow up with Phillips Odor, MD.   Specialty:  Neurology   Contact information:   [address] [place] [address] [phone]        The results of significant diagnostics from this hospitalization (including imaging, microbiology, ancillary and laboratory) are listed below for reference.    Significant Diagnostic Studies: Ct Head Wo Contrast  09/20/2015  CLINICAL DATA:  Bilateral hand and foot numbness for 1-2 months. EXAM: CT HEAD WITHOUT CONTRAST TECHNIQUE: Contiguous axial images were obtained from the base of the skull through the vertex without intravenous contrast. COMPARISON:  None. FINDINGS: The ventricles are normal in size and configuration. No extra-axial fluid collections are identified. The gray-white differentiation is normal. No CT findings for acute intracranial process such as hemorrhage or infarction. No mass lesions. The brainstem and cerebellum are grossly normal. The bony structures are intact. The paranasal sinuses and mastoid air cells are clear. The globes are intact. IMPRESSION: Normal head CT. Electronically Signed   By: Marijo Sanes M.D.   On: 09/20/2015 22:04   Mr Cervical Spine Wo Contrast  09/21/2015  CLINICAL DATA:  Neck stiffness with bilateral hand numbness. EXAM: MRI CERVICAL SPINE WITHOUT CONTRAST TECHNIQUE: Multiplanar, multisequence MR imaging of the cervical spine was performed. No intravenous contrast was administered. COMPARISON:  None. FINDINGS: No marrow signal abnormality suggestive of fracture, infection, or neoplasm. Normal cord signal and morphology. No extra-spinal findings to explain symptoms. Normal flow related signal loss in the visible cervical and carotid arteries. Craniocervical junction: Negative. C2-3: Negative C3-4: Disc: Mild uncovertebral ridging  Facets: Negative. Canal: Patent. Foramina: No impingement. C4-5: Disc: Narrowing with small central protrusion. Bilateral uncovertebral ridging. Facets: Negative. Canal: Small disc protrusion contacts the ventral cord with mild flattening Foramina: Open C5-6: Disc: Degenerative narrowing and ventral spurring. Central protrusion with ventral cord flattening. No cord signal abnormality Facets: Negative. Canal: Protrusion flattens the ventral and right more than left hemi cord Foramina: No impingement. C6-7: Disc: Left paracentral disc protrusion superimposed on posterior endplate ridge. Narrowing with endplate and uncovertebral ridging Facets: Negative. Canal: Left hemi cord ventral flattening from disc protrusion and endplate ridge Foramina:  Open with mild narrowing from uncovertebral spurs. C7-T1: Negative IMPRESSION: 1. C4-5, C5-6, and C6-7 predominant degenerative disc disease. Disc protrusions at these levels cause cord flattening, greatest at C5-6. 2. No cord signal abnormality. Electronically Signed   By: Monte Fantasia M.D.   On: 09/21/2015 07:55   Mr Lumbar Spine Wo Contrast  09/21/2015  CLINICAL DATA:  Low back pain and bilateral leg weakness for 1 month EXAM: MRI LUMBAR SPINE WITHOUT CONTRAST TECHNIQUE: Multiplanar, multisequence MR imaging of the lumbar spine was performed. No intravenous contrast was administered. COMPARISON:  Lumbar spine radiography 07/09/2008 FINDINGS: Segmentation: Transitional anatomy with open small disc space at S1-S2. This numbering is based on 12 paired ribs on 2013 thoracic spine radiograph and the lowest ribs placed at T12. Alignment: Physiologic. Vertebrae: No signal abnormality  to suggest fracture, discitis, or mass. Conus: Extends to the L1-2 disc level and appears normal. Paraspinal and retroperitoneal structures: Negative. Disc levels: T12- L1 to L3-4:  Negative L4-5:  Mild degenerative marginal spurring of the facets. L5-S1: Disc: Comparative disc desiccation. Small  left paracentral disc protrusion with slight downward turning, best seen on sagittal acquisition, chronic based on 2013 abdominal CT. This minimally displaces the S1 nerve root without compression Facets: Arthropathy with marginal spurring. Canal:  Open.  Left paracentral disc protrusion as described above. Foramina: No impingement. IMPRESSION: 1. Mild disc degeneration at L5-S1 with chronic small left paracentral protrusion. The protrusion contacts but does not compress the S1 nerve. 2. No canal or foraminal stenosis. 3. L4-5 and L5-S1 facet arthropathy. 4. Transitional lumbosacral anatomy with open S1-S2 disc space. Electronically Signed   By: Monte Fantasia M.D.   On: 09/21/2015 08:02    Microbiology: No results found for this or any previous visit (from the past 240 hour(s)).   Labs: Basic Metabolic Panel:  Recent Labs Lab 09/20/15 2100 09/21/15 0505  NA 138 138  K 4.1 3.5  CL 108 106  CO2 21* 22  GLUCOSE 137* 142*  BUN 18 19  CREATININE 1.02 0.92  CALCIUM 9.3 8.8*   Liver Function Tests:  Recent Labs Lab 09/20/15 2100 09/21/15 0505  AST 20 17  ALT 19 17  ALKPHOS 102 89  BILITOT 0.3 0.1*  PROT 8.4* 7.6  ALBUMIN 4.4 4.0  CBC:  Recent Labs Lab 09/20/15 2100 09/21/15 0505  WBC 9.6 9.3  NEUTROABS 7.8*  --   HGB 13.9 12.7*  HCT 41.1 37.1*  MCV 81.7 82.1  PLT 328 299      Signed:  Kathie Dike, MD\ [facility] 09/21/2015, 6:34 PM  Start time for patient encounter: 4:40pm End time for patient encounter and co ordination of care: 5:40pm  MEMON,JEHANZEB

## 2015-09-22 LAB — RPR: RPR Ser Ql: NONREACTIVE

## 2015-09-22 LAB — FOLATE RBC
FOLATE, RBC: 1183 ng/mL (ref >498)
Folate, Hemolysate: 470.7 ng/mL
Hematocrit: 39.8 % (ref 37.5–51.0)

## 2015-09-22 LAB — HIV ANTIBODY (ROUTINE TESTING W REFLEX): HIV Screen 4th Generation wRfx: NONREACTIVE

## 2015-09-22 LAB — ANTINUCLEAR ANTIBODIES, IFA: ANTINUCLEAR ANTIBODIES, IFA: NEGATIVE

## 2015-11-14 ENCOUNTER — Emergency Department (HOSPITAL_COMMUNITY)
Admission: EM | Admit: 2015-11-14 | Discharge: 2015-11-14 | Disposition: A | Discharge status: Home / Self Care | Payer: Self-pay | Attending: Emergency Medicine | Admitting: Emergency Medicine

## 2015-11-14 ENCOUNTER — Encounter (HOSPITAL_COMMUNITY): Payer: Self-pay | Admitting: Emergency Medicine

## 2015-11-14 DIAGNOSIS — Z79899 Other long term (current) drug therapy: Secondary | ICD-10-CM | POA: Insufficient documentation

## 2015-11-14 DIAGNOSIS — Z760 Encounter for issue of repeat prescription: Secondary | ICD-10-CM | POA: Insufficient documentation

## 2015-11-14 DIAGNOSIS — F17210 Nicotine dependence, cigarettes, uncomplicated: Secondary | ICD-10-CM | POA: Insufficient documentation

## 2015-11-14 DIAGNOSIS — M542 Cervicalgia: Secondary | ICD-10-CM | POA: Insufficient documentation

## 2015-11-14 MED ORDER — IBUPROFEN 800 MG PO TABS
800.0000 mg | ORAL_TABLET | Freq: Once | ORAL | Status: AC
  Administered 2015-11-14: 800 mg via ORAL
  Filled 2015-11-14: qty 1

## 2015-11-14 NOTE — Discharge Instructions (Signed)
1. Medications: usual home medications 2. Treatment: rest, drink plenty of fluids,  3. Follow Up: Please followup with your spine surgeon in 1 day for discussion of your diagnoses and further evaluation after today's visit

## 2015-11-14 NOTE — ED Notes (Signed)
Pt states he had spinal sx about a month ago and tonight his 35 year old knocked his pain medication into the toilet.
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
## 2015-11-15 ENCOUNTER — Emergency Department (HOSPITAL_COMMUNITY)
Admission: EM | Admit: 2015-11-15 | Discharge: 2015-11-15 | Disposition: A | Discharge status: Home / Self Care | Payer: Self-pay | Attending: Emergency Medicine | Admitting: Emergency Medicine

## 2015-11-15 ENCOUNTER — Encounter (HOSPITAL_COMMUNITY): Payer: Self-pay | Admitting: *Deleted

## 2015-11-15 DIAGNOSIS — F1120 Opioid dependence, uncomplicated: Secondary | ICD-10-CM | POA: Insufficient documentation

## 2015-11-15 DIAGNOSIS — F17210 Nicotine dependence, cigarettes, uncomplicated: Secondary | ICD-10-CM | POA: Insufficient documentation

## 2015-11-15 DIAGNOSIS — F1193 Opioid use, unspecified with withdrawal: Secondary | ICD-10-CM

## 2015-11-15 DIAGNOSIS — F1123 Opioid dependence with withdrawal: Secondary | ICD-10-CM

## 2015-11-15 MED ORDER — OXYCODONE-ACETAMINOPHEN 5-325 MG PO TABS: 1.0000 | ORAL_TABLET | ORAL | Status: DC | PRN

## 2015-11-15 NOTE — ED Notes (Signed)
6 pack of Percocet to-go dispensed to patient at d/c. Instructions given with verbal understanding from patient.

## 2015-11-15 NOTE — ED Notes (Signed)
Pt c/o "withdrawals" with symptoms of runny nose, headache, diaphoresis, shaky, restless legs, diarrhea, decreased appetite x 2 days ago. Pt has spinal fusion on May 8th and was given Oxycodone after the surgery and has been taking it as prescribed. Pt reports he ran out of pain medication 2 days ago and was given a prescription for more pain medication on May 26th but the prescription paper got "ruined" so he has tried to come off the medication without having to get another prescription.

## 2015-11-15 NOTE — Discharge Instructions (Signed)
You will not be given any further opiate medications through the ER - you MUST see your doctor for any further refills.
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
## 2015-11-23 MED FILL — Oxycodone w/ Acetaminophen Tab 5-325 MG: ORAL | Qty: 6 | Status: AC

## 2016-04-14 ENCOUNTER — Emergency Department (HOSPITAL_COMMUNITY)
Admission: EM | Admit: 2016-04-14 | Discharge: 2016-04-14 | Disposition: A | Discharge status: Home / Self Care | Payer: Self-pay | Attending: Emergency Medicine | Admitting: Emergency Medicine

## 2016-04-14 ENCOUNTER — Encounter (HOSPITAL_COMMUNITY): Payer: Self-pay | Admitting: *Deleted

## 2016-04-14 DIAGNOSIS — M549 Dorsalgia, unspecified: Secondary | ICD-10-CM

## 2016-04-14 DIAGNOSIS — M5440 Lumbago with sciatica, unspecified side: Secondary | ICD-10-CM | POA: Insufficient documentation

## 2016-04-14 DIAGNOSIS — Z7901 Long term (current) use of anticoagulants: Secondary | ICD-10-CM | POA: Insufficient documentation

## 2016-04-14 DIAGNOSIS — F17210 Nicotine dependence, cigarettes, uncomplicated: Secondary | ICD-10-CM | POA: Insufficient documentation

## 2016-04-14 DIAGNOSIS — G8929 Other chronic pain: Secondary | ICD-10-CM | POA: Insufficient documentation

## 2016-04-14 DIAGNOSIS — Z91040 Latex allergy status: Secondary | ICD-10-CM | POA: Insufficient documentation

## 2016-04-14 MED ORDER — HYDROMORPHONE HCL 2 MG/ML IJ SOLN
1.0000 mg | Freq: Once | INTRAMUSCULAR | Status: AC
  Administered 2016-04-14: 1 mg via INTRAMUSCULAR
  Filled 2016-04-14: qty 1

## 2016-04-14 NOTE — ED Provider Notes (Signed)
[facility] DEPT Provider Note   CSN: [id number] Arrival date & time: 04/14/16  1803     History   Chief Complaint Chief Complaint  Patient presents with  . Back Pain    HPI BRANCE HEIDELBERG is a 35 y.o. male.  The history is provided by the patient.  Back Pain   Associated symptoms include numbness and weakness. Pertinent negatives include no chest pain, no fever and no abdominal pain.  Patient presents with acute on chronic back pain. Said chronic pain in his neck and lower back. Has had cervical fusion. Has some chronic weakness and numbness from it. States his lower back has been getting worse. States he saw neurology yesterday. No relief with his oxycodone at home. No loss of bladder bowel control. He uses a wheelchair often. The pain is severe. States he has weakness in his legs. Has tingling in his hands. States he was told he may need another surgery. Get seen through [facility] for the same.  Past Medical History:  Diagnosis Date  . Chronic hand pain   . DDD (degenerative disc disease), cervical   . De Quervain's tenosynovitis     Patient Active Problem List   Diagnosis Date Noted  . Numbness 09/21/2015    Past Surgical History:  Procedure Laterality Date  . SPINAL FUSION         Home Medications    Prior to Admission medications   Medication Sig Start Date End Date Taking? Authorizing Provider  cyclobenzaprine (FLEXERIL) 10 MG tablet Take 10 mg by mouth 3 (three) times daily.   Yes Historical Provider, MD  gabapentin (NEURONTIN) 400 MG capsule Take 400 mg by mouth 3 (three) times daily.   Yes Historical Provider, MD  Calcium Citrate 200 MG TABS Take 950 mg by mouth daily. 10/26/15 11/25/15  Historical Provider, MD  enoxaparin (LOVENOX) 40 MG/0.4ML injection Inject 0.4 mLs (40 mg total) into the skin daily. Patient not taking: Reported on 04/14/2016 09/21/15   Kathie Dike, MD  gabapentin (NEURONTIN) 400 MG capsule Take 1,200 mg by mouth 3 (three)  times daily. 10/26/15 11/25/15  Historical Provider, MD  HYDROmorphone (DILAUDID) 1 MG/ML injection Inject 0.5 mLs (0.5 mg total) into the vein every 4 (four) hours as needed for severe pain. Patient not taking: Reported on 04/14/2016 09/21/15   Kathie Dike, MD  oxyCODONE-acetaminophen (PERCOCET) 5-325 MG tablet Take 1 tablet by mouth every 4 (four) hours as needed. Patient not taking: Reported on 04/14/2016 11/15/15   Noemi Chapel, MD    Family History No family history on file.  Social History Social History  Substance Use Topics  . Smoking status: Current Every Day Smoker    Packs/day: 0.50    Years: 16.00    Types: Cigarettes    Last attempt to quit: 12/20/2013  . Smokeless tobacco: Never Used  . Alcohol use No     Allergies   Latex   Review of Systems Review of Systems  Constitutional: Negative for appetite change and fever.  Respiratory: Negative for shortness of breath.   Cardiovascular: Negative for chest pain.  Gastrointestinal: Negative for abdominal pain.  Genitourinary: Negative for flank pain.  Musculoskeletal: Positive for back pain.  Skin: Negative for wound.  Neurological: Positive for weakness and numbness.  Psychiatric/Behavioral: Negative for behavioral problems.     Physical Exam Updated Vital Signs BP 104/75   Pulse 76   Temp 98.9 F (37.2 C) (Oral)   Resp 24   SpO2 99%   Physical  Exam  Constitutional: He appears well-developed.  HENT:  Head: Atraumatic.  Eyes: EOM are normal.  Cardiovascular: Normal rate.   Abdominal: Soft.  Neurological: He is alert.  Decreased strength in both ankles. Chronic per patient. States this is not much weaker than it has been in the past. Somewhat decreased strength in both upper extremities to. Perineal sensation intact.  Skin: Skin is warm. Capillary refill takes less than 2 seconds.     ED Treatments / Results  Labs (all labs ordered are listed, but only abnormal results are displayed) Labs Reviewed -  No data to display  EKG  EKG Interpretation None       Radiology No results found.  Procedures Procedures (including critical care time)  Medications Ordered in ED Medications  HYDROmorphone (DILAUDID) injection 1 mg (1 mg Intramuscular Given 04/14/16 2144)     Initial Impression / Assessment and Plan / ED Course  I have reviewed the triage vital signs and the nursing notes.  Pertinent labs & imaging results that were available during my care of the patient were reviewed by me and considered in my medical decision making (see chart for details).  Clinical Course    Patient with acute on chronic backpainHas been seen recently by neurology and neurosurgery for it. Has chronic weakness that has not really changed. Previous imaging reviewed. Given 1 shot of Dilaudid here with some relief. Patient is on chronic pain medicines. Cannot adjust in the ER. Discharged follow-up with neurosurgery.  Final Clinical Impressions(s) / ED Diagnoses   Final diagnoses:  Chronic midline low back pain with sciatica, sciatica laterality unspecified  Chronic back pain, unspecified back location, unspecified back pain laterality    New Prescriptions New Prescriptions   No medications on file     Davonna Belling, MD 04/14/16 2259

## 2016-04-14 NOTE — ED Triage Notes (Signed)
The pt is c/o pain in his neck tingling in both his arms and legs for 1-2 weeks. He had neck surgery within the past 6 months  He saw his neurosurgeon yesterday who told him he needed another surgery

## 2016-08-16 ENCOUNTER — Emergency Department (HOSPITAL_COMMUNITY)
Admission: EM | Admit: 2016-08-16 | Discharge: 2016-08-16 | Disposition: A | Discharge status: Home / Self Care | Payer: Self-pay | Attending: Emergency Medicine | Admitting: Emergency Medicine

## 2016-08-16 ENCOUNTER — Encounter (HOSPITAL_COMMUNITY): Payer: Self-pay | Admitting: Emergency Medicine

## 2016-08-16 DIAGNOSIS — F17210 Nicotine dependence, cigarettes, uncomplicated: Secondary | ICD-10-CM | POA: Insufficient documentation

## 2016-08-16 DIAGNOSIS — M542 Cervicalgia: Secondary | ICD-10-CM | POA: Insufficient documentation

## 2016-08-16 DIAGNOSIS — M545 Low back pain: Secondary | ICD-10-CM | POA: Insufficient documentation

## 2016-08-16 DIAGNOSIS — M546 Pain in thoracic spine: Secondary | ICD-10-CM | POA: Insufficient documentation

## 2016-08-16 DIAGNOSIS — Z91040 Latex allergy status: Secondary | ICD-10-CM | POA: Insufficient documentation

## 2016-08-16 DIAGNOSIS — Z8739 Personal history of other diseases of the musculoskeletal system and connective tissue: Secondary | ICD-10-CM

## 2016-08-16 DIAGNOSIS — G8929 Other chronic pain: Secondary | ICD-10-CM | POA: Insufficient documentation

## 2016-08-16 HISTORY — DX: Other chronic pain: G89.29

## 2016-08-16 HISTORY — DX: Opioid dependence, uncomplicated: F11.20

## 2016-08-16 HISTORY — DX: Malingerer (conscious simulation): Z76.5

## 2016-08-16 HISTORY — DX: Dorsalgia, unspecified: M54.9

## 2016-08-16 MED ORDER — NAPROXEN 500 MG PO TABS: 500.0000 mg | ORAL_TABLET | Freq: Two times a day (BID) | ORAL | 0 refills | Status: DC

## 2016-08-16 MED ORDER — DIAZEPAM 5 MG PO TABS
5.0000 mg | ORAL_TABLET | Freq: Once | ORAL | Status: AC
  Administered 2016-08-16: 5 mg via ORAL
  Filled 2016-08-16: qty 1

## 2016-08-16 MED ORDER — KETOROLAC TROMETHAMINE 30 MG/ML IJ SOLN
30.0000 mg | Freq: Once | INTRAMUSCULAR | Status: AC
  Administered 2016-08-16: 30 mg via INTRAMUSCULAR
  Filled 2016-08-16: qty 1

## 2016-08-16 NOTE — ED Notes (Signed)
EDP at bedside  

## 2016-08-16 NOTE — ED Provider Notes (Signed)
[facility] DEPT Provider Note   CSN: [id number] Arrival date & time: 08/16/16  1734     History   Chief Complaint Chief Complaint  Patient presents with  . Neck Pain  . Back Pain  . Weakness    HPI Steve Henry is a 36 y.o. male.  HPI   Pt is a 36 yo male with PMH of chronic neck and back pain (s/p anterior cervical discectomy with fusion on 10/18/15), Harriet Pho, DDD who presents to the ED with complaint of worsening neck and back pain. Pt reports over the past week he has had worsening chronic neck and back pain. He reports the pain radiates down to his left shoulder and down his entire right arm. Endorses associated weakness to his arms and legs and numbness to his right arm. Denies any recent fall, trauma or injury. Reports taking ibuprofen and flexeril at home without relief. During interview, pt denies recently seeing his orthopedist regarding his pain or any recent imaging. However after discussing with pt his recent visit with his orthopedist (which was seen on chart review) and recent imaging of his neck and back he changed his story and reports that his sxs are chronic and consistent with when he was last seen by his orthopedist on 08/04/16. Denies fever, HA, dizziness, neck stiffness, CP, SOB, abdominal pain, vomiting, fever, tingling, saddle anesthesia, loss of bowel or bladder, weakness, IVDU, cancer or recent spinal manipulation.   Orthopedist- Dr. Romona Curls ([place])  Past Medical History:  Diagnosis Date  . Chronic back pain   . Chronic hand pain   . DDD (degenerative disc disease), cervical   . De Quervain's tenosynovitis   . Drug-seeking behavior   . Narcotic dependence [facility])     Patient Active Problem List   Diagnosis Date Noted  . Numbness 09/21/2015    Past Surgical History:  Procedure Laterality Date  . SPINAL FUSION         Home Medications    Prior to Admission medications   Medication Sig Start Date End Date Taking?  Authorizing Provider  Calcium Citrate 200 MG TABS Take 950 mg by mouth daily. 10/26/15 11/25/15  Historical Provider, MD  cyclobenzaprine (FLEXERIL) 10 MG tablet Take 10 mg by mouth 3 (three) times daily.    Historical Provider, MD  enoxaparin (LOVENOX) 40 MG/0.4ML injection Inject 0.4 mLs (40 mg total) into the skin daily. Patient not taking: Reported on 04/14/2016 09/21/15   Kathie Dike, MD  gabapentin (NEURONTIN) 400 MG capsule Take 1,200 mg by mouth 3 (three) times daily. 10/26/15 11/25/15  Historical Provider, MD  gabapentin (NEURONTIN) 400 MG capsule Take 400 mg by mouth 3 (three) times daily.    Historical Provider, MD  HYDROmorphone (DILAUDID) 1 MG/ML injection Inject 0.5 mLs (0.5 mg total) into the vein every 4 (four) hours as needed for severe pain. Patient not taking: Reported on 04/14/2016 09/21/15   Kathie Dike, MD  naproxen (NAPROSYN) 500 MG tablet Take 1 tablet (500 mg total) by mouth 2 (two) times daily. 08/16/16   Nona Dell, PA-C  oxyCODONE-acetaminophen (PERCOCET) 5-325 MG tablet Take 1 tablet by mouth every 4 (four) hours as needed. Patient not taking: Reported on 04/14/2016 11/15/15   Noemi Chapel, MD    Family History No family history on file.  Social History Social History  Substance Use Topics  . Smoking status: Current Every Day Smoker    Packs/day: 0.50    Years: 16.00    Types: Cigarettes  Last attempt to quit: 12/20/2013  . Smokeless tobacco: Never Used  . Alcohol use No     Allergies   Latex   Review of Systems Review of Systems  Musculoskeletal: Positive for back pain and neck pain.  Neurological: Positive for weakness and numbness.  All other systems reviewed and are negative.    Physical Exam Updated Vital Signs BP 127/86 (BP Location: Left Arm)   Pulse 87   Temp 98.5 F (36.9 C) (Oral)   Resp 18   Ht 5\' 4"  (1.626 m)   Wt 74.8 kg   SpO2 97%   BMI 28.32 kg/m   Physical Exam  Constitutional: He is oriented to person, place,  and time. He appears well-developed and well-nourished. No distress.  HENT:  Head: Normocephalic and atraumatic.  Eyes: Conjunctivae and EOM are normal. Right eye exhibits no discharge. Left eye exhibits no discharge. No scleral icterus.  Neck: Trachea normal, normal range of motion and full passive range of motion without pain. Neck supple. Spinous process tenderness and muscular tenderness present. No neck rigidity. No edema, no erythema and normal range of motion present.  Cardiovascular: Normal rate, regular rhythm, normal heart sounds and intact distal pulses.   Pulmonary/Chest: Effort normal and breath sounds normal. No respiratory distress. He has no wheezes. He has no rales. He exhibits no tenderness.  Abdominal: Soft. Bowel sounds are normal. He exhibits no distension and no mass. There is no tenderness. There is no rebound and no guarding.  Musculoskeletal: Normal range of motion. He exhibits tenderness. He exhibits no edema or deformity.  Decreased strength of BUE and BLE: Right deltoid: 4/5 Left deltoid: 4/5 Right biceps: 4/5 Left biceps: 4/5 Right triceps: 4/5 Left triceps: 4/5 Right wrist flexion: 3/5 Left wrist flexion: 4/5 Right wrist extension: 3/5 Left wrist extension: 4/5 Right grip strength: 3/5 Left grip strength: 4/5 Right quadriceps: 3/5 Left quadriceps: 3/5 Right dorsiflexion: 3/5 Left dorsiflexion: 4/5 Right plantarflexion: 3/5 Left plantarflexion: 3/5  Diffuse tenderness over midline C, T, L spine. No evidence of injury or palpable step-off/deformity. Diffuse TTP over right cervical, thoracic and lumbar paraspinal muscles. Dec. ROM of back due to reported pain. Diffuse TTP over musculature of BUE. 2+ radial and PT pulses. Cap refill <2. Sensation grossly intact.      Neurological: He is alert and oriented to person, place, and time. No sensory deficit.  Reflex Scores:      Patellar reflexes are 2+ on the right side and 2+ on the left side. Skin: Skin is  warm and dry. He is not diaphoretic.  Nursing note and vitals reviewed.    ED Treatments / Results  Labs (all labs ordered are listed, but only abnormal results are displayed) Labs Reviewed - No data to display  EKG  EKG Interpretation None       Radiology No results found.  Procedures Procedures (including critical care time)  Medications Ordered in ED Medications  ketorolac (TORADOL) 30 MG/ML injection 30 mg (30 mg Intramuscular Given 08/16/16 2216)  diazepam (VALIUM) tablet 5 mg (5 mg Oral Given 08/16/16 2216)     Initial Impression / Assessment and Plan / ED Course  I have reviewed the triage vital signs and the nursing notes.  Pertinent labs & imaging results that were available during my care of the patient were reviewed by me and considered in my medical decision making (see chart for details).     Pt presents with worsening chronic neck and back pain. Denies any recent  fall, trauma or injury. Hx of chronic neck and back pain (s/p anterior cervical discectomy with fusion on 10/18/15), Harriet Pho, DDD. VSS. Exam revealed decreased strength noted to BUE and BLE, extremities otherwise neurovascularly intact. DTRs intact. Diffuse tenderness over upper and lower extremity musculature. Pt able to stand and ambulate with cane but refuses to actively fully flex/extend legs. Shortly after, pt then moved from sitting on the side of the bed to laying in the bed without any assistance or difficulty. Pt given pain meds and muscle relaxant in the ED.   Chart review shows pt was last seen by his orthopedist on 08/04/16. Pt's exam consistent with exam seen in the ED tonight. Cervical spine ordered showed no compression of spinal cord or existing nerve roots. Pt referred to pain management, given rx of flexeril and gabapentin. MRI lumbar spine ordered to rule out additional stenosis due to LE sxs. Pt ordered physical therapy.   07/21/16 MRI cervical spine: 1.Postsurgical changes of C4-C7  ACDF. 2.Residual posterior osteophytes the C5-C6 indents the ventral aspect of the spinal cord. 3.Mild foraminal narrowing bilaterally at C3-C4 and on the left at C6-C7.  08/14/16 MRI lumbar spine: 1.Mild disc bulges at L4-L5 and L5-S1 without spinal canal stenosis. 2.Mild bilateral foraminal stenosis at L5-S1. 3.Mild facet osteoarthritis at L4-L5 and L5-S1.  On reevaluation pt resting in no acute discomfort. Discussed MRI results with pt. Pt sxs appear to be consistent with chronic pain. Plan to d/c pt home with symptomatic tx and follow up with pain management clinic. Discussed strict return precautions.   Final Clinical Impressions(s) / ED Diagnoses   Final diagnoses:  Other chronic pain  H/O degenerative disc disease    New Prescriptions Discharge Medication List as of 08/16/2016 11:16 PM    START taking these medications   Details  naproxen (NAPROSYN) 500 MG tablet Take 1 tablet (500 mg total) by mouth 2 (two) times daily., Starting Wed 08/16/2016, Print         Chesley Noon [place], [place] 08/16/16 2348    Charlesetta Shanks, MD 08/19/16 [phone]

## 2016-08-16 NOTE — ED Triage Notes (Signed)
Pt. Stated, I 've had neck pain and back with with some extrimity weakness for 2 days. This is ongoing.. I was seen by doctor in [place] .

## 2016-08-16 NOTE — Discharge Instructions (Signed)
Continue taking your home medications as prescribed including your muscle relaxant.  I recommend following up with the pain clinic your orthopedist referred you to for further management of your chronic neck/back pain.  Please return to the Emergency Department if symptoms worsen or new onset of fever, numbness, tingling, groin anesthesia, loss of bowel or bladder, weakness, neck stiffness, swelling, rash.

## 2016-08-16 NOTE — ED Notes (Signed)
Pt called out requesting pain medicine, EDP aware

## 2017-03-25 IMAGING — MR MR LUMBAR SPINE W/O CM
4 of 5 series · 13 of 48 positions shown · non-contrast
Comparison: Lumbar spine radiography 07/09/2008

CLINICAL DATA: Low back pain and bilateral leg weakness for 1 month

EXAM:
MRI LUMBAR SPINE WITHOUT CONTRAST
TECHNIQUE: Multiplanar, multisequence MR imaging of the lumbar spine was
performed. No intravenous contrast was administered.

[Series 3: T2 · sagittal · 4.0mm · 0.40mm/px · 4 of 13 slices shown (1 of 2)]
[im 1/13]
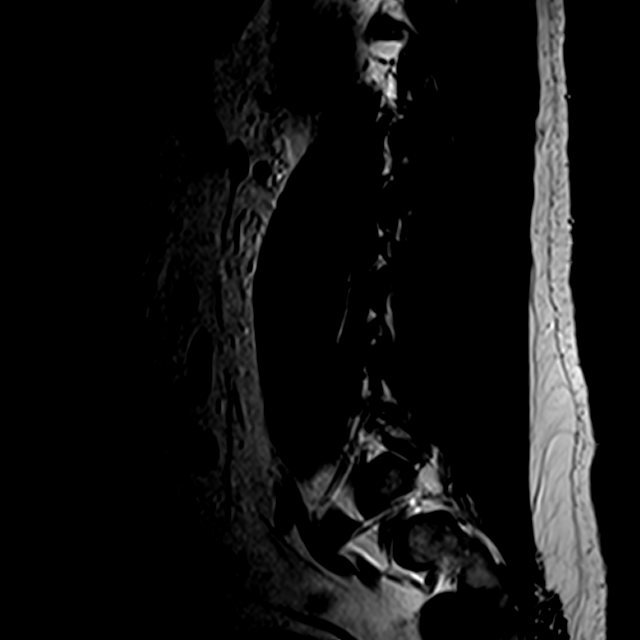
[im 3/13]
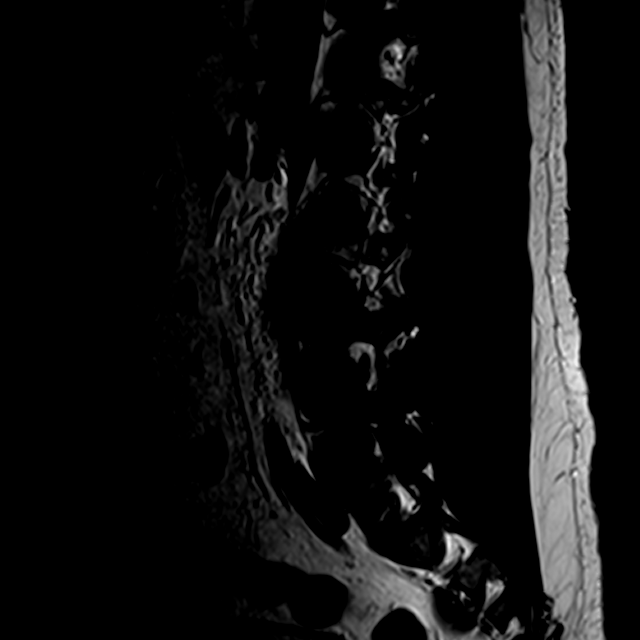
[im 8/13]
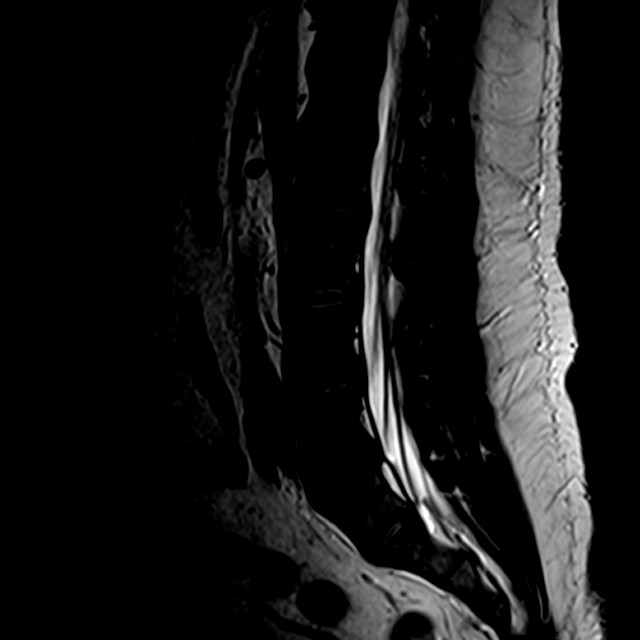
[im 13/13]
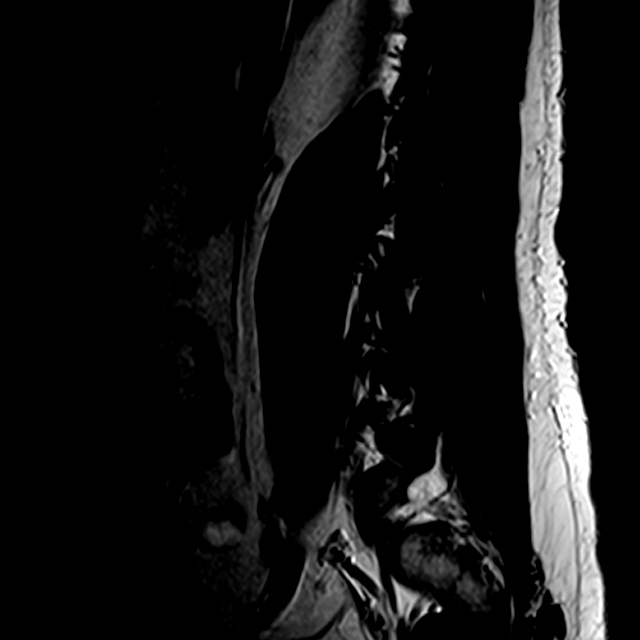

[Series 4: T1 · sagittal · 4.0mm · 0.42mm/px · 3 of 13 slices shown (1 of 2)]
[im 3/13]
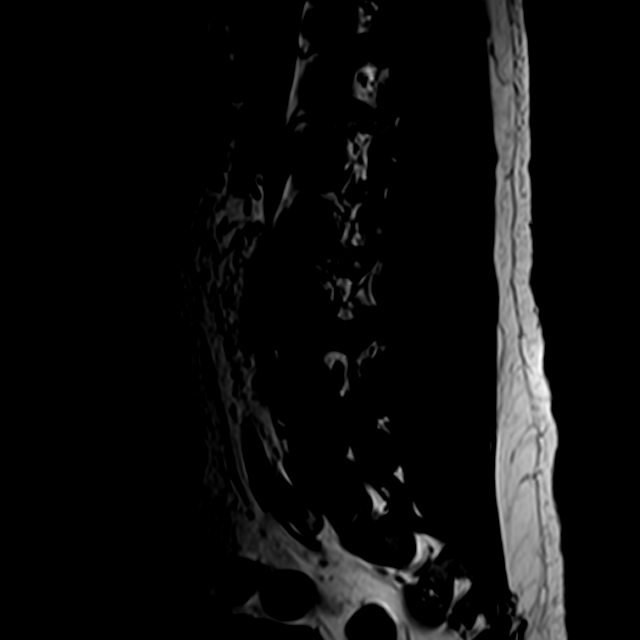
[im 8/13]
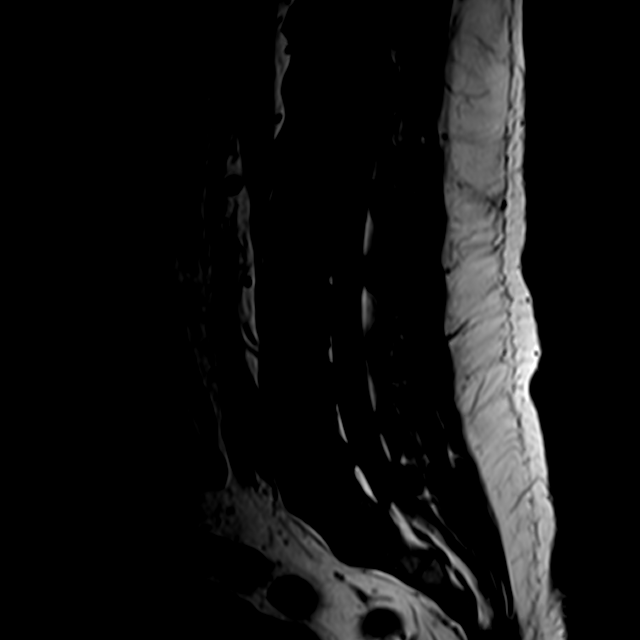
[im 13/13]
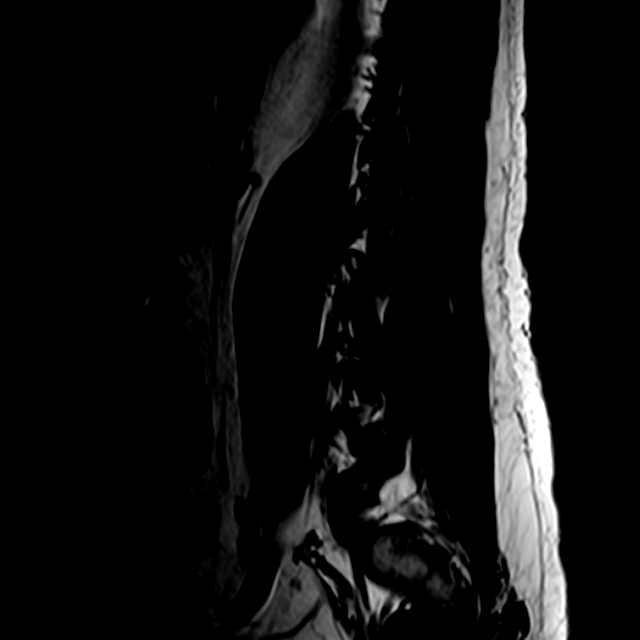

[Series 6: T2 · axial · 4.0mm · 0.19mm/px · z∈[-485,-356]mm · 3 of 33 slices shown (2 of 2)]
[im 5/33]
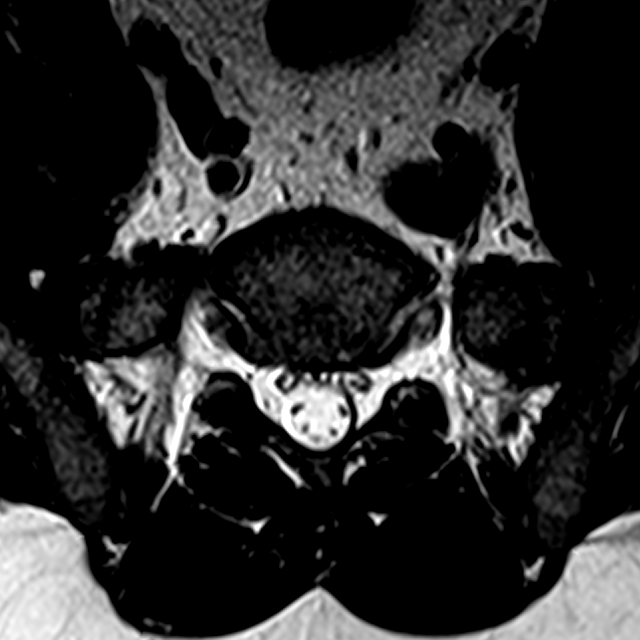
[im 17/33]
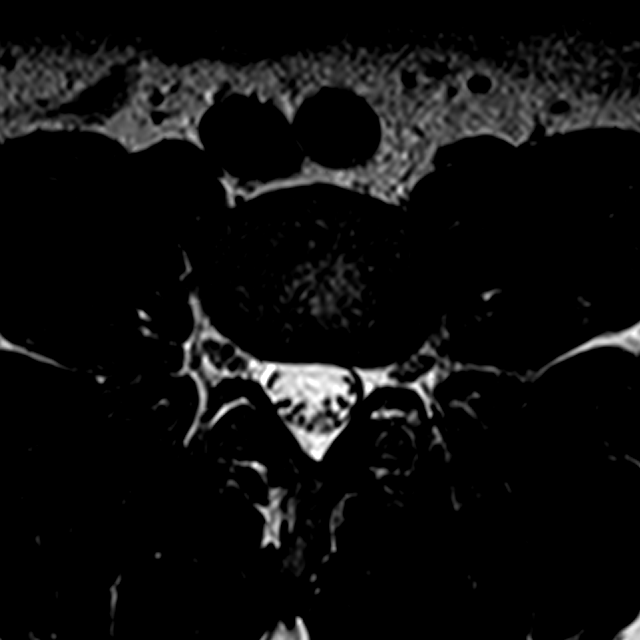
[im 28/33]
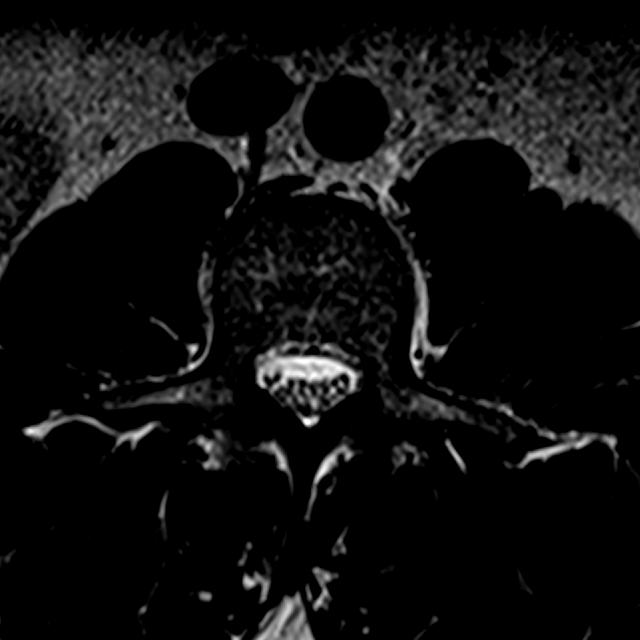

[Series 7: T1 · axial · 4.0mm · 0.21mm/px · z∈[-484,-356]mm · 3 of 33 slices shown (2 of 2)]
[im 5/33]
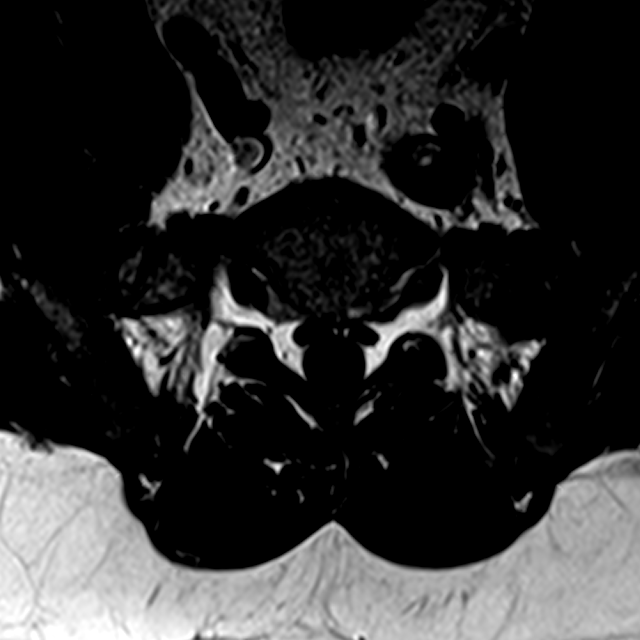
[im 17/33]
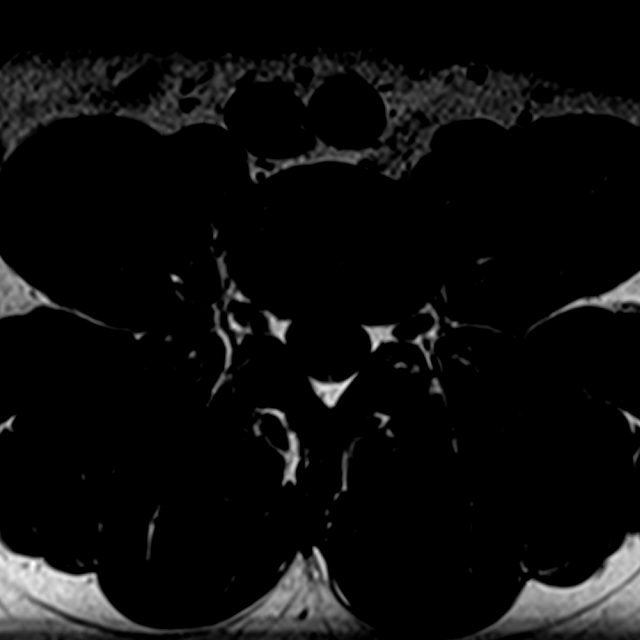
[im 28/33]
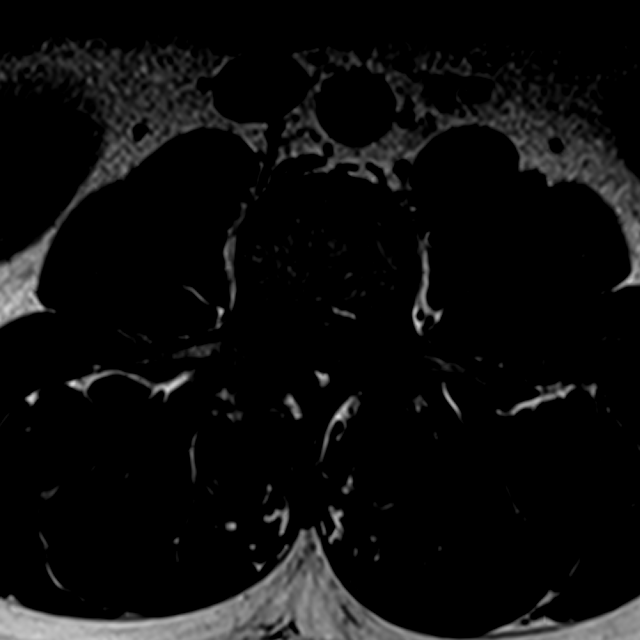

[13 of 48 positions shown; findings below may reference images not displayed]

FINDINGS: Segmentation: Transitional anatomy with open small disc space at
S1-S2. This numbering is based on 12 paired ribs on 1803 thoracic
spine radiograph and the lowest ribs placed at T12.

Alignment: Physiologic.

Vertebrae: No signal abnormality to suggest fracture, discitis, or
mass.

Conus: Extends to the L1-2 disc level and appears normal.

Paraspinal and retroperitoneal structures: Negative.

Disc levels:

T12- L1 to L3-4:  Negative

L4-5:  Mild degenerative marginal spurring of the facets.

L5-S1:

Disc: Comparative disc desiccation. Small left paracentral disc
protrusion with slight downward turning, best seen on sagittal
acquisition, chronic based on 1803 abdominal CT. This minimally
displaces the S1 nerve root without compression

Facets: Arthropathy with marginal spurring.

Canal:  Open.  Left paracentral disc protrusion as described above.

Foramina: No impingement.
IMPRESSION: 1. Mild disc degeneration at L5-S1 with chronic small left
paracentral protrusion. The protrusion contacts but does not
compress the S1 nerve.
2. No canal or foraminal stenosis.
3. L4-5 and L5-S1 facet arthropathy.
4. Transitional lumbosacral anatomy with open S1-S2 disc space.

## 2017-03-25 IMAGING — MR MR CERVICAL SPINE W/O CM
4 of 5 series · 17 of 48 positions shown · non-contrast
Comparison: None.

CLINICAL DATA: Neck stiffness with bilateral hand numbness.

EXAM:
MRI CERVICAL SPINE WITHOUT CONTRAST
TECHNIQUE: Multiplanar, multisequence MR imaging of the cervical spine was
performed. No intravenous contrast was administered.

[Series 3: T2 · sagittal · 3.0mm · 0.33mm/px · 7 of 16 slices shown (1 of 2)]
[im 1/16]
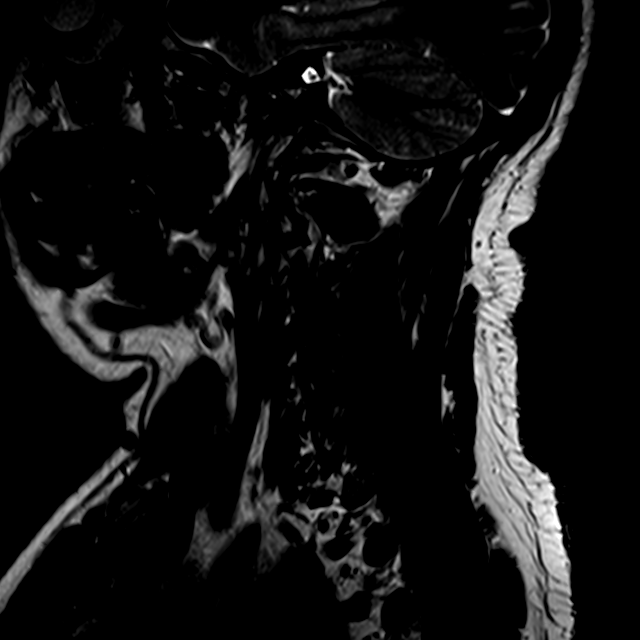
[im 3/16]
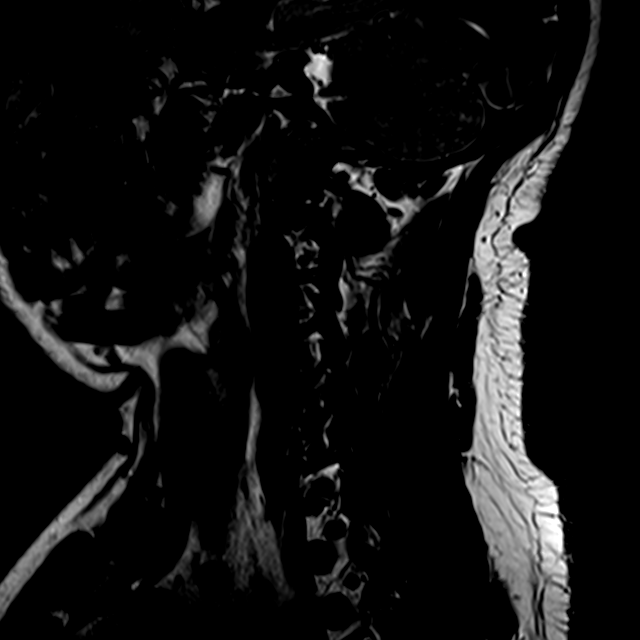
[im 6/16]
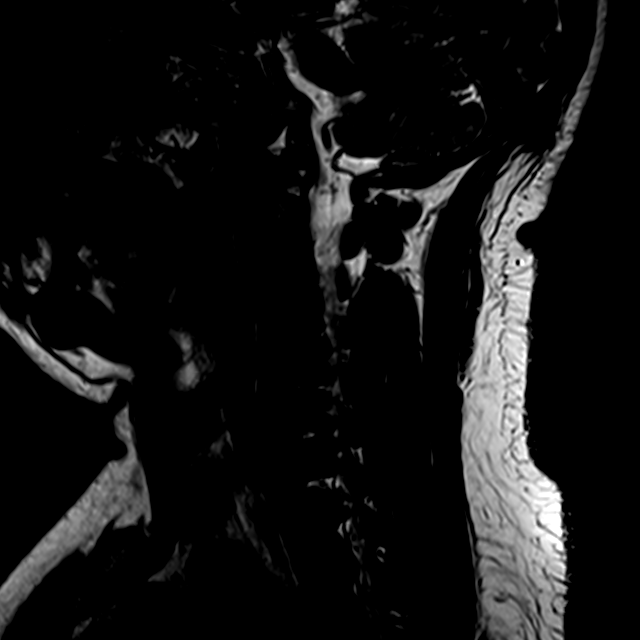
[im 8/16]
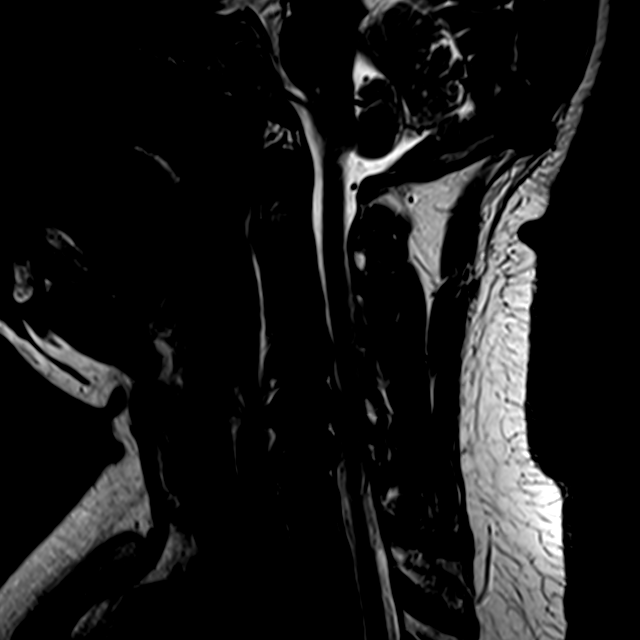
[im 11/16]
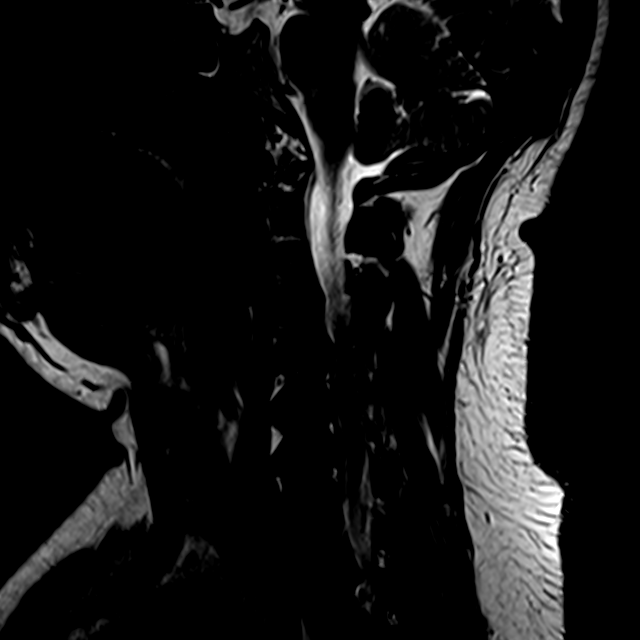
[im 13/16]
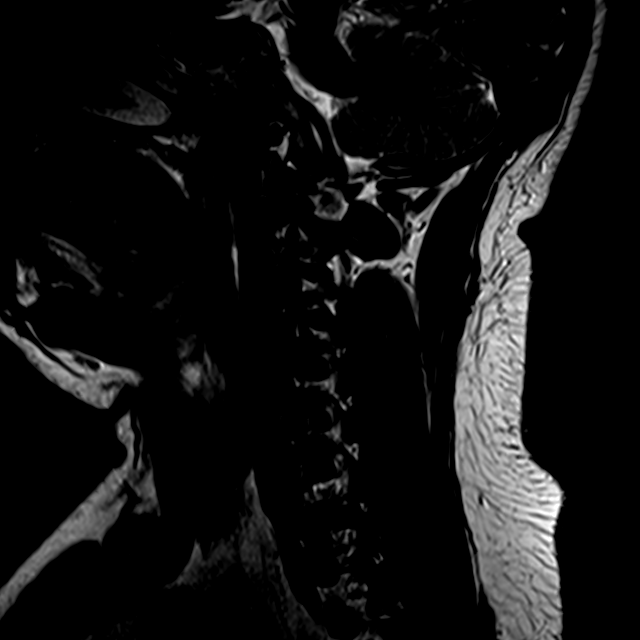
[im 16/16]
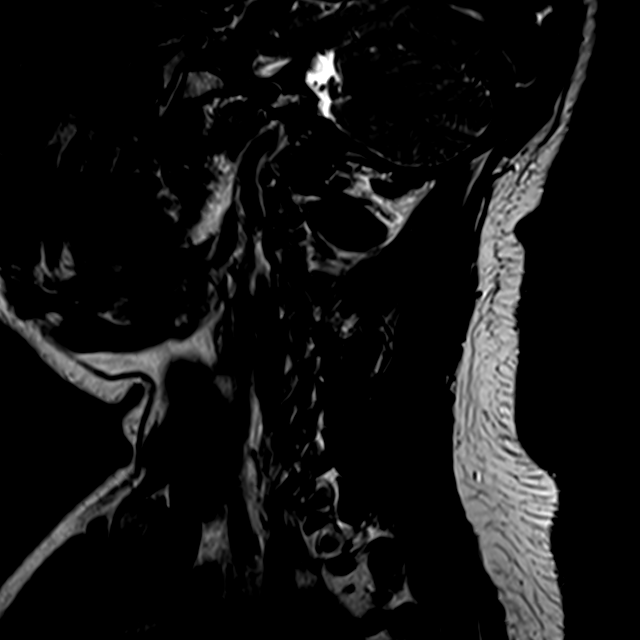

[Series 4: FLAIR · sagittal · 3.0mm · 0.66mm/px · 3 of 16 slices shown]
[im 3/16]
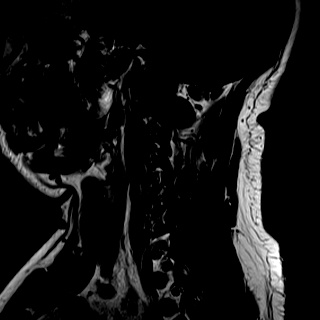
[im 8/16]
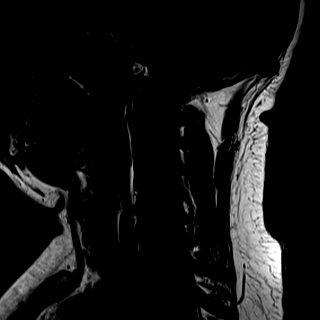
[im 13/16]
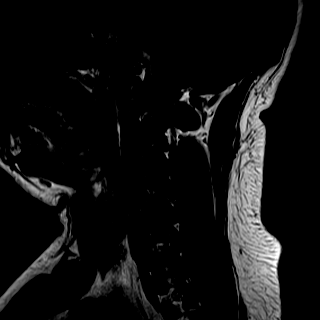

[Series 5: ir sagital · sagittal · 3.0mm · 0.38mm/px · 3 of 15 slices shown]
[im 3/15]
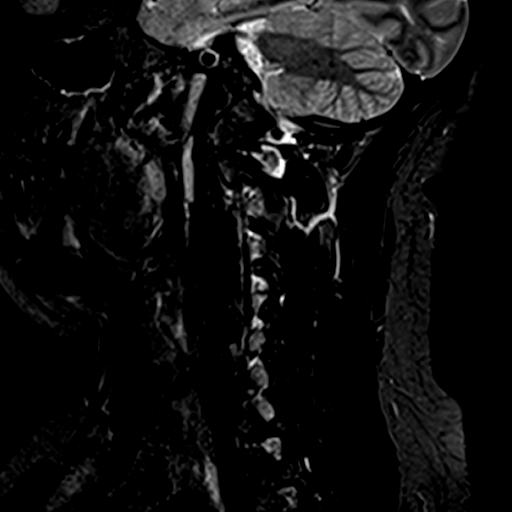
[im 9/15]
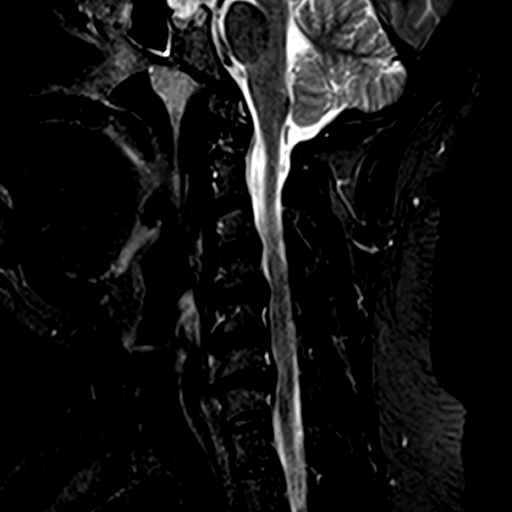
[im 15/15]
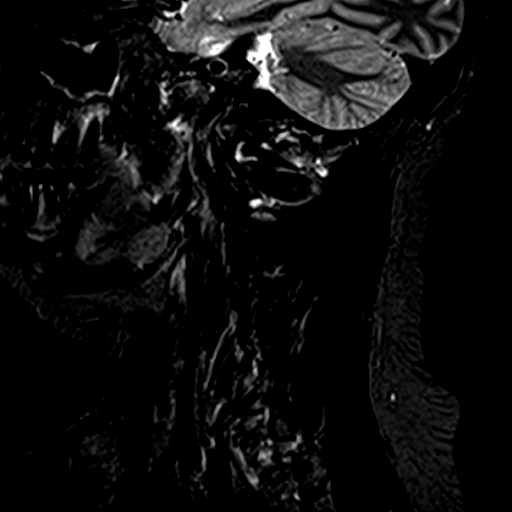

[Series 7: T2 · axial · 3.0mm · 0.22mm/px · z∈[-51,+30]mm · 4 of 32 slices shown (2 of 2)]
[im 1/32]
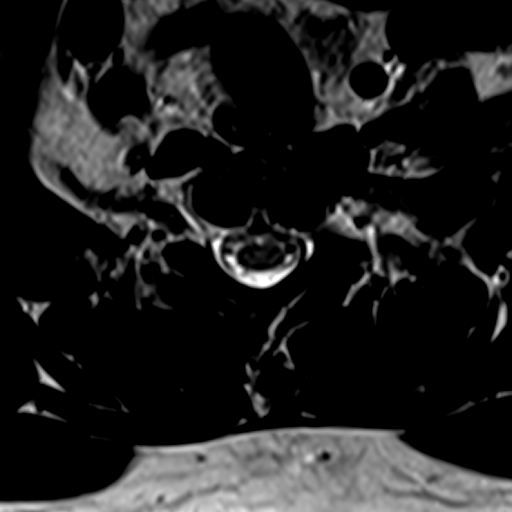
[im 5/32]
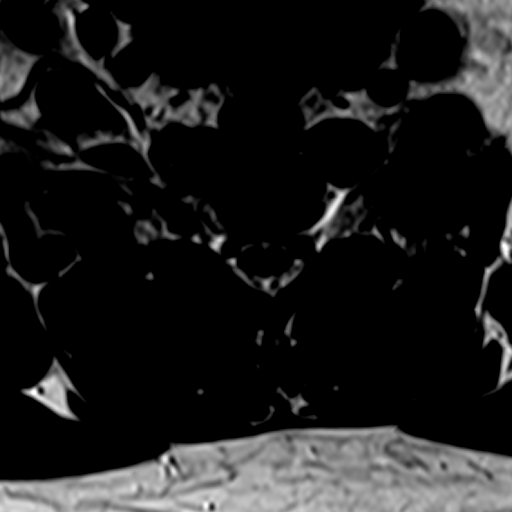
[im 17/32]
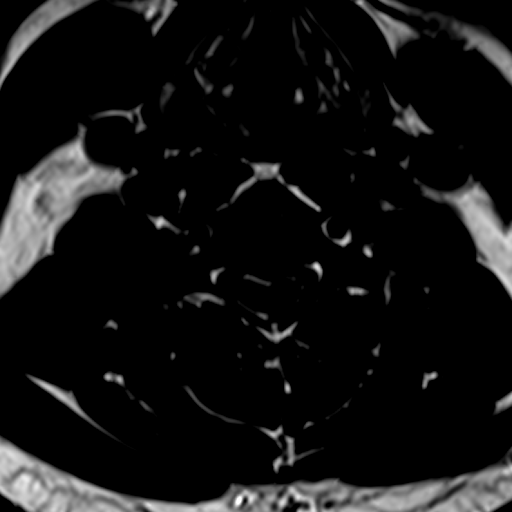
[im 27/32]
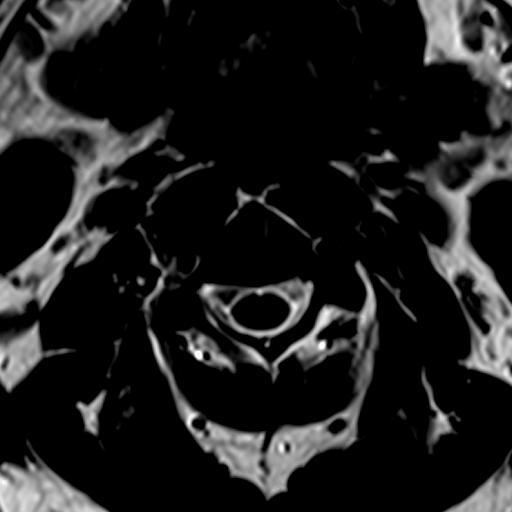

[17 of 48 positions shown; findings below may reference images not displayed]

FINDINGS: No marrow signal abnormality suggestive of fracture, infection, or
neoplasm.

Normal cord signal and morphology.

No extra-spinal findings to explain symptoms. Normal flow related
signal loss in the visible cervical and carotid arteries.

Craniocervical junction: Negative.

C2-3:

Negative

C3-4:

Disc: Mild uncovertebral ridging

Facets: Negative.

Canal: Patent.

Foramina: No impingement.

C4-5:

Disc: Narrowing with small central protrusion. Bilateral
uncovertebral ridging.

Facets: Negative.

Canal: Small disc protrusion contacts the ventral cord with mild
flattening

Foramina: Open

C5-6:

Disc: Degenerative narrowing and ventral spurring. Central
protrusion with ventral cord flattening. No cord signal abnormality

Facets: Negative.

Canal: Protrusion flattens the ventral and right more than left hemi
cord

Foramina: No impingement.

C6-7:

Disc: Left paracentral disc protrusion superimposed on posterior
endplate ridge. Narrowing with endplate and uncovertebral ridging

Facets: Negative.

Canal: Left hemi cord ventral flattening from disc protrusion and
endplate ridge

Foramina:  Open with mild narrowing from uncovertebral spurs.

C7-T1:

Negative
IMPRESSION: 1. C4-5, C5-6, and C6-7 predominant degenerative disc disease. Disc
protrusions at these levels cause cord flattening, greatest at C5-6.
2. No cord signal abnormality.

## 2017-12-26 ENCOUNTER — Other Ambulatory Visit: Payer: Self-pay

## 2017-12-26 ENCOUNTER — Encounter (HOSPITAL_COMMUNITY): Payer: Self-pay | Admitting: *Deleted

## 2017-12-26 ENCOUNTER — Emergency Department (HOSPITAL_COMMUNITY)
Admission: EM | Admit: 2017-12-26 | Discharge: 2017-12-26 | Disposition: A | Discharge status: Home / Self Care | Payer: Medicaid Other | Attending: Emergency Medicine | Admitting: Emergency Medicine

## 2017-12-26 DIAGNOSIS — R202 Paresthesia of skin: Secondary | ICD-10-CM | POA: Insufficient documentation

## 2017-12-26 DIAGNOSIS — Z79899 Other long term (current) drug therapy: Secondary | ICD-10-CM | POA: Insufficient documentation

## 2017-12-26 DIAGNOSIS — R531 Weakness: Secondary | ICD-10-CM | POA: Diagnosis present

## 2017-12-26 DIAGNOSIS — F17210 Nicotine dependence, cigarettes, uncomplicated: Secondary | ICD-10-CM | POA: Diagnosis not present

## 2017-12-26 MED ORDER — HYDROCODONE-ACETAMINOPHEN 5-325 MG PO TABS: 1.0000 | ORAL_TABLET | ORAL | 0 refills | Status: DC | PRN

## 2017-12-26 MED ORDER — HYDROCODONE-ACETAMINOPHEN 5-325 MG PO TABS
1.0000 | ORAL_TABLET | Freq: Once | ORAL | Status: AC
  Administered 2017-12-26: 1 via ORAL
  Filled 2017-12-26: qty 1

## 2017-12-26 NOTE — ED Notes (Signed)
Updated pt on delays and wait time

## 2017-12-26 NOTE — Discharge Instructions (Signed)
Continue to work with your orthopedic doctor and your neurologist regarding your weakness and numbness. Continue to work to get in to a pain management clinic.

## 2017-12-26 NOTE — ED Triage Notes (Signed)
The pt is c/o  Pain in his ntire lt body for 4 days  Pain from lt neck down his entire lt side numbness in both  Arms throbbing  Spinal fusion in the past

## 2017-12-26 NOTE — ED Provider Notes (Signed)
[facility] EMERGENCY DEPARTMENT Provider Note   CSN: [id number] Arrival date & time: 12/26/17  1804     History   Chief Complaint Chief Complaint  Patient presents with  . Generalized Body Aches    HPI HASHIM EICHHORST is a 37 y.o. male.  The history is provided by the patient.  He has history of chronic neck and back pain and is status post cervical fusion and comes in with neck pain radiating into both arms and numbness of his left arm.  He had seen his orthopedic surgeon last week and was sent for an MRI.  He has been trying to get into a pain management clinic, but has been unable to get in because of insurance issues.  He has also had episodes of urinary incontinence.  He is rather vague about timeframe, but states symptoms have been going on for the last several weeks.  Past Medical History:  Diagnosis Date  . Chronic back pain   . Chronic hand pain   . DDD (degenerative disc disease), cervical   . De Quervain's tenosynovitis   . Drug-seeking behavior   . Narcotic dependence [facility])     Patient Active Problem List   Diagnosis Date Noted  . Numbness 09/21/2015    Past Surgical History:  Procedure Laterality Date  . SPINAL FUSION          Home Medications    Prior to Admission medications   Medication Sig Start Date End Date Taking? Authorizing Provider  cyclobenzaprine (FLEXERIL) 10 MG tablet Take 10 mg by mouth 3 (three) times daily.   Yes [provider]  meloxicam (MOBIC) 15 MG tablet Take 15 mg by mouth daily. 12/12/17  Yes [provider]  traZODone (DESYREL) 50 MG tablet Take 50 mg by mouth at bedtime.   Yes [provider]  enoxaparin (LOVENOX) 40 MG/0.4ML injection Inject 0.4 mLs (40 mg total) into the skin daily. Patient not taking: Reported on 04/14/2016 09/21/15   Kathie Dike, MD  HYDROmorphone (DILAUDID) 1 MG/ML injection Inject 0.5 mLs (0.5 mg total) into the vein every 4 (four) hours as needed for  severe pain. Patient not taking: Reported on 04/14/2016 09/21/15   Kathie Dike, MD  naproxen (NAPROSYN) 500 MG tablet Take 1 tablet (500 mg total) by mouth 2 (two) times daily. Patient not taking: Reported on 12/26/2017 08/16/16   Nona Dell, PA-C  oxyCODONE-acetaminophen (PERCOCET) 5-325 MG tablet Take 1 tablet by mouth every 4 (four) hours as needed. Patient not taking: Reported on 04/14/2016 11/15/15   Noemi Chapel, MD    Family History No family history on file.  Social History Social History   Tobacco Use  . Smoking status: Current Every Day Smoker    Packs/day: 0.50    Years: 16.00    Pack years: 8.00    Types: Cigarettes    Last attempt to quit: 12/20/2013    Years since quitting: 4.0  . Smokeless tobacco: Never Used  Substance Use Topics  . Alcohol use: No  . Drug use: No     Allergies   Tramadol and Latex   Review of Systems Review of Systems  All other systems reviewed and are negative.    Physical Exam Updated Vital Signs BP (!) 136/95 (BP Location: Right Arm)   Pulse 90   Temp 98.5 F (36.9 C) (Oral)   Resp 18   Ht 5\' 2"  (1.575 m)   Wt 83.9 kg (185 lb)  SpO2 99%   BMI 33.84 kg/m   Physical Exam  Nursing note and vitals reviewed.  37 year old male, resting comfortably and in no acute distress. Vital signs are significant for elevated diastolic blood pressure. Oxygen saturation is 99%, which is normal. Head is normocephalic and atraumatic. PERRLA, EOMI. Oropharynx is clear. Neck is nontender and supple without adenopathy or JVD. Back is nontender and there is no CVA tenderness. Lungs are clear without rales, wheezes, or rhonchi. Chest is nontender. Heart has regular rate and rhythm without murmur. Abdomen is soft, flat, nontender without masses or hepatosplenomegaly and peristalsis is normoactive. Extremities have no cyanosis or edema, full range of motion is present. Skin is warm and dry without rash. Neurologic: Mental status is  normal, cranial nerves are intact.  There is generalized weakness of arms and legs with weakness worse in the left arm.  Strength in left arm is 3/5, other strength is 3+/5-this includes right arm, right leg, left leg.  There is markedly decreased sensation throughout the left arm extending into the left chest on the left side of the back down to approximately a T6 level.  ED Treatments / Results  Labs (all labs ordered are listed, but only abnormal results are displayed) Labs Reviewed - No data to display  EKG EKG Interpretation  Date/Time:  Wednesday December 26 2017 18:55:56 EDT Ventricular Rate:  112 PR Interval:  152 QRS Duration: 86 QT Interval:  312 QTC Calculation: 425 R Axis:   75 Text Interpretation:  Sinus tachycardia Nonspecific T wave abnormality Abnormal ECG No old tracing to compare Confirmed by Delora Fuel ([id number]) on 12/26/2017 11:11:33 PM   Radiology No results found.  Procedures Procedures   Medications Ordered in ED Medications - No data to display   Initial Impression / Assessment and Plan / ED Course  I have reviewed the triage vital signs and the nursing notes.  Generalized weakness and numbness of the left arm and chronic pain.  Old records were reviewed and I was able to find his MRI report which showed stable fusion C4-7 left C3-4 nerve root compression, no other findings that would explain any of his symptoms.  I have also reviewed his record on the [place] controlled substance reporting website, and he has not had any narcotic prescriptions in the last 5 months.  He does have a neurologist that he has seen and neurology note from June 2018 reports urinary incontinence at that time.  I do not see any evidence of any acute neurologic event.  He does have issues with chronic pain and with neurologic findings which are not explained by MRI scan.  He is referred back to his neurologist and back to his orthopedic surgeon.  He is given resource guide to try to  find a primary care, and also given information about pain management clinics.  He is given prescription for small number of hydrocodone-acetaminophen tablets.  Patient states that he is agreeable with this plan.  Final Clinical Impressions(s) / ED Diagnoses   Final diagnoses:  Generalized weakness  Paresthesias    ED Discharge Orders        Ordered    HYDROcodone-acetaminophen (NORCO) 5-325 MG tablet  Every 4 hours PRN     18/56/31 4970       Delora Fuel, MD 26/37/85 [phone]

## 2017-12-26 NOTE — ED Notes (Signed)
ED Provider at bedside. 

## 2018-02-13 ENCOUNTER — Encounter (HOSPITAL_COMMUNITY): Payer: Self-pay | Admitting: *Deleted

## 2018-02-13 ENCOUNTER — Emergency Department (HOSPITAL_COMMUNITY)
Admission: EM | Admit: 2018-02-13 | Discharge: 2018-02-13 | Disposition: A | Discharge status: Home / Self Care | Payer: Medicaid Other | Attending: Emergency Medicine | Admitting: Emergency Medicine

## 2018-02-13 ENCOUNTER — Other Ambulatory Visit: Payer: Self-pay

## 2018-02-13 DIAGNOSIS — Z91040 Latex allergy status: Secondary | ICD-10-CM | POA: Insufficient documentation

## 2018-02-13 DIAGNOSIS — R220 Localized swelling, mass and lump, head: Secondary | ICD-10-CM | POA: Diagnosis not present

## 2018-02-13 DIAGNOSIS — Z79899 Other long term (current) drug therapy: Secondary | ICD-10-CM | POA: Insufficient documentation

## 2018-02-13 DIAGNOSIS — F17210 Nicotine dependence, cigarettes, uncomplicated: Secondary | ICD-10-CM | POA: Insufficient documentation

## 2018-02-13 MED ORDER — CEPHALEXIN 500 MG PO CAPS: 500.0000 mg | ORAL_CAPSULE | Freq: Four times a day (QID) | ORAL | 0 refills | Status: DC

## 2018-02-13 NOTE — ED Triage Notes (Signed)
Pt c/o bump to left side of his head that has recently become painful over the last few weeks;

## 2018-02-13 NOTE — ED Provider Notes (Signed)
[facility] EMERGENCY DEPARTMENT Provider Note   CSN: [id number] Arrival date & time: 02/13/18  1449     History   Chief Complaint Chief Complaint  Patient presents with  . Headache    HPI Steve Henry is a 37 y.o. male.  The history is provided by the patient. No language interpreter was used.  Headache   This is a chronic problem. Episode onset: 1 year ago. The problem occurs constantly. The problem has been gradually worsening. The headache is associated with nothing. The pain is moderate. The pain does not radiate. He has tried nothing for the symptoms.  Pt reports lump on his head is getting bigger  Past Medical History:  Diagnosis Date  . Chronic back pain   . Chronic hand pain   . DDD (degenerative disc disease), cervical   . De Quervain's tenosynovitis   . Drug-seeking behavior   . Narcotic dependence [facility])     Patient Active Problem List   Diagnosis Date Noted  . Numbness 09/21/2015    Past Surgical History:  Procedure Laterality Date  . SPINAL FUSION          Home Medications    Prior to Admission medications   Medication Sig Start Date End Date Taking? Authorizing Provider  cyclobenzaprine (FLEXERIL) 10 MG tablet Take 10 mg by mouth 3 (three) times daily.    [provider]  HYDROcodone-acetaminophen (NORCO) 5-325 MG tablet Take 1 tablet by mouth every 4 (four) hours as needed for moderate pain. 2/69/48   Delora Fuel, MD  meloxicam (MOBIC) 15 MG tablet Take 15 mg by mouth daily. 12/12/17   [provider]  traZODone (DESYREL) 50 MG tablet Take 50 mg by mouth at bedtime.    [provider]    Family History History reviewed. No pertinent family history.  Social History Social History   Tobacco Use  . Smoking status: Current Every Day Smoker    Packs/day: 0.50    Years: 16.00    Pack years: 8.00    Types: Cigarettes    Last attempt to quit: 12/20/2013    Years since quitting: 4.1  . Smokeless tobacco: Never  Used  Substance Use Topics  . Alcohol use: No  . Drug use: No     Allergies   Tramadol and Latex   Review of Systems Review of Systems  Neurological: Positive for headaches.  All other systems reviewed and are negative.    Physical Exam Updated Vital Signs BP 128/86 (BP Location: Right Arm)   Pulse 95   Temp 98.3 F (36.8 C) (Oral)   Resp 16   Ht 5' (1.524 m)   Wt 72.6 kg   SpO2 99%   BMI 31.25 kg/m   Physical Exam  HENT:  Head: Normocephalic.  3cm round raised area left scalp,  Feels like a lipoma    Eyes: Pupils are equal, round, and reactive to light.  Neck: Normal range of motion.  Cardiovascular: Normal rate.  Pulmonary/Chest: Effort normal.  Musculoskeletal: Normal range of motion.  Neurological: He is alert.  Skin: Skin is warm.  Psychiatric: He has a normal mood and affect.  Nursing note and vitals reviewed.    ED Treatments / Results  Labs (all labs ordered are listed, but only abnormal results are displayed) Labs Reviewed - No data to display  EKG None  Radiology No results found.  Procedures Procedures (including critical care time)  Medications Ordered in ED Medications - No data to display  Initial Impression / Assessment and Plan / ED Course  I have reviewed the triage vital signs and the nursing notes.  Pertinent labs & imaging results that were available during my care of the patient were reviewed by me and considered in my medical decision making (see chart for details).     MDM  Pt placed on antibiotics.  Pt advised to schedule to see Dr. Arnoldo Morale for evaluation   Final Clinical Impressions(s) / ED Diagnoses   Final diagnoses:  Mass of scalp    ED Discharge Orders         Ordered    cephALEXin (KEFLEX) 500 MG capsule  4 times daily,   Status:  Discontinued     02/13/18 1635    cephALEXin (KEFLEX) 500 MG capsule  4 times daily     02/13/18 1636        An After Visit Summary was printed and given to the  patient.   Fransico Meadow, [place] 02/13/18 1638    Carmin Muskrat, MD 02/13/18 [phone]

## 2018-03-05 ENCOUNTER — Ambulatory Visit: Payer: Self-pay | Admitting: General Surgery

## 2018-03-07 ENCOUNTER — Ambulatory Visit (INDEPENDENT_AMBULATORY_CARE_PROVIDER_SITE_OTHER): Payer: Medicaid Other | Admitting: General Surgery

## 2018-03-07 ENCOUNTER — Encounter: Payer: Self-pay | Admitting: General Surgery

## 2018-03-07 VITALS — BP 144/80 | HR 71 | Temp 97.8°F | Ht 60.0 in | Wt 168.0 lb

## 2018-03-07 DIAGNOSIS — D179 Benign lipomatous neoplasm, unspecified: Secondary | ICD-10-CM | POA: Diagnosis not present

## 2018-03-07 NOTE — Patient Instructions (Signed)
Steve Henry  03/07/2018     @PREFPERIOPPHARMACY @   Your procedure is scheduled on  03/13/2018   Report to [facility] at  830  A.M.  Call this number if you have problems the morning of surgery:  [phone]   Remember:  Do not eat or drink after midnight.  You may drink clear liquids until  12 midnight 03/12/2018 .  Clear liquids allowed are:                    Water, Juice (non-citric and without pulp), Carbonated beverages, Clear Tea, Black Coffee only, Plain Jell-O only, Gatorade and Plain Popsicles only    Take these medicines the morning of surgery with A SIP OF WATER  flexaril ( if needed), hydrocodone( if needed), mobic ( if needed).    Do not wear jewelry, make-up or nail polish.  Do not wear lotions, powders, or perfumes, or deodorant.  Do not shave 48 hours prior to surgery.  Men may shave face and neck.  Do not bring valuables to the hospital.  [facility] is not responsible for any belongings or valuables.  Contacts, dentures or bridgework may not be worn into surgery.  Leave your suitcase in the car.  After surgery it may be brought to your room.  For patients admitted to the hospital, discharge time will be determined by your treatment team.  Patients discharged the day of surgery will not be allowed to drive home.   Name and phone number of your driver:   family Special instructions:  None  Please read over the following fact sheets that you were given. Anesthesia Post-op Instructions and Care and Recovery After Surgery       Lipoma Removal Lipoma removal is a surgical procedure to remove a noncancerous (benign) tumor that is made up of fat cells (lipoma). Most lipomas are small and painless and do not require treatment. They can form in many areas of the body but are most common under the skin of the back, shoulders, arms, and thighs. You may need lipoma removal if you have a lipoma that is large, growing, or causing discomfort. Lipoma  removal may also be done for cosmetic reasons. Tell a health care provider about:  Any allergies you have.  All medicines you are taking, including vitamins, herbs, eye drops, creams, and over-the-counter medicines.  Any problems you or family members have had with anesthetic medicines.  Any blood disorders you have.  Any surgeries you have had.  Any medical conditions you have.  Whether you are pregnant or may be pregnant. What are the risks? Generally, this is a safe procedure. However, problems may occur, including:  Infection.  Bleeding.  Allergic reactions to medicines.  Damage to nerves or blood vessels near the lipoma.  Scarring.  What happens before the procedure? Staying hydrated Follow instructions from your health care provider about hydration, which may include:  Up to 2 hours before the procedure - you may continue to drink clear liquids, such as water, clear fruit juice, black coffee, and plain tea.  Eating and drinking restrictions Follow instructions from your health care provider about eating and drinking, which may include:  8 hours before the procedure - stop eating heavy meals or foods such as meat, fried foods, or fatty foods.  6 hours before the procedure - stop eating light meals or foods, such as toast or cereal.  6 hours before the  procedure - stop drinking milk or drinks that contain milk.  2 hours before the procedure - stop drinking clear liquids.  Medicines  Ask your health care provider about: ? Changing or stopping your regular medicines. This is especially important if you are taking diabetes medicines or blood thinners. ? Taking medicines such as aspirin and ibuprofen. These medicines can thin your blood. Do not take these medicines before your procedure if your health care provider instructs you not to.  You may be given antibiotic medicine to help prevent infection. General instructions  Ask your health care provider how your  surgical site will be marked or identified.  You will have a physical exam. Your health care provider will check the size of the lipoma and whether it can be moved easily.  You may have imaging tests, such as: ? X-rays. ? CT scan. ? MRI.  Plan to have someone take you home from the hospital or clinic. What happens during the procedure?  To reduce your risk of infection: ? Your health care team will wash or sanitize their hands. ? Your skin will be washed with soap.  You will be given one or more of the following: ? A medicine to help you relax (sedative). ? A medicine to numb the area (local anesthetic). ? A medicine to make you fall asleep (general anesthetic). ? A medicine that is injected into an area of your body to numb everything below the injection site (regional anesthetic).  An incision will be made over the lipoma or very near the lipoma. The incision may be made in a natural skin line or crease.  Tissues, nerves, and blood vessels near the lipoma will be moved out of the way.  The lipoma and the capsule that surrounds it will be separated from the surrounding tissues.  The lipoma will be removed.  The incision may be closed with stitches (sutures).  A bandage (dressing) will be placed over the incision. What happens after the procedure?  Do not drive for 24 hours if you received a sedative.  Your blood pressure, heart rate, breathing rate, and blood oxygen level will be monitored until the medicines you were given have worn off. This information is not intended to replace advice given to you by your health care provider. Make sure you discuss any questions you have with your health care provider. Document Released: 08/12/2015 Document Revised: 11/04/2015 Document Reviewed: 08/12/2015 Elsevier Interactive Patient Education  2018 [place].  Lipoma Removal, Care After Refer to this sheet in the next few weeks. These instructions provide you with information  about caring for yourself after your procedure. Your health care provider may also give you more specific instructions. Your treatment has been planned according to current medical practices, but problems sometimes occur. Call your health care provider if you have any problems or questions after your procedure. What can I expect after the procedure? After the procedure, it is common to have:  Mild pain.  Swelling.  Bruising.  Follow these instructions at home:  Bathing  Do not take baths, swim, or use a hot tub until your health care provider approves. Ask your health care provider if you can take showers. You may only be allowed to take sponge baths for bathing.  Keep your bandage (dressing) dry until your health care provider says it can be removed. Incision care   Follow instructions from your health care provider about how to take care of your incision. Make sure you: ? Wash your  hands with soap and water before you change your bandage (dressing). If soap and water are not available, use hand sanitizer. ? Change your dressing as told by your health care provider. ? Leave stitches (sutures), skin glue, or adhesive strips in place. These skin closures may need to stay in place for 2 weeks or longer. If adhesive strip edges start to loosen and curl up, you may trim the loose edges. Do not remove adhesive strips completely unless your health care provider tells you to do that.  Check your incision area every day for signs of infection. Check for: ? More redness, swelling, or pain. ? Fluid or blood. ? Warmth. ? Pus or a bad smell. Driving  Do not drive or operate heavy machinery while taking prescription pain medicine.  Do not drive for 24 hours if you received a medicine to help you relax (sedative) during your procedure.  Ask your health care provider when it is safe for you to drive. General instructions  Take over-the-counter and prescription medicines only as told by your  health care provider.  Do not use any tobacco products, such as cigarettes, chewing tobacco, and e-cigarettes. These can delay healing. If you need help quitting, ask your health care provider.  Return to your normal activities as told by your health care provider. Ask your health care provider what activities are safe for you.  Keep all follow-up visits as told by your health care provider. This is important. Contact a health care provider if:  You have more redness, swelling, or pain around your incision.  You have fluid or blood coming from your incision.  Your incision feels warm to the touch.  You have pus or a bad smell coming from your incision.  You have pain that does not get better with medicine. Get help right away if:  You have chills or a fever.  You have severe pain. This information is not intended to replace advice given to you by your health care provider. Make sure you discuss any questions you have with your health care provider. Document Released: 08/12/2015 Document Revised: 11/09/2015 Document Reviewed: 08/12/2015 Elsevier Interactive Patient Education  2018 [place] Anesthesia is a term that refers to techniques, procedures, and medicines that help a person stay safe and comfortable during a medical procedure. Monitored anesthesia care, or sedation, is one type of anesthesia. Your anesthesia specialist may recommend sedation if you will be having a procedure that does not require you to be unconscious, such as:  Cataract surgery.  A dental procedure.  A biopsy.  A colonoscopy.  During the procedure, you may receive a medicine to help you relax (sedative). There are three levels of sedation:  Mild sedation. At this level, you may feel awake and relaxed. You will be able to follow directions.  Moderate sedation. At this level, you will be sleepy. You may not remember the procedure.  Deep sedation. At this level, you  will be asleep. You will not remember the procedure.  The more medicine you are given, the deeper your level of sedation will be. Depending on how you respond to the procedure, the anesthesia specialist may change your level of sedation or the type of anesthesia to fit your needs. An anesthesia specialist will monitor you closely during the procedure. Let your health care provider know about:  Any allergies you have.  All medicines you are taking, including vitamins, herbs, eye drops, creams, and over-the-counter medicines.  Any use of  steroids (by mouth or as a cream).  Any problems you or family members have had with sedatives and anesthetic medicines.  Any blood disorders you have.  Any surgeries you have had.  Any medical conditions you have, such as sleep apnea.  Whether you are pregnant or may be pregnant.  Any use of cigarettes, alcohol, or street drugs. What are the risks? Generally, this is a safe procedure. However, problems may occur, including:  Getting too much medicine (oversedation).  Nausea.  Allergic reaction to medicines.  Trouble breathing. If this happens, a breathing tube may be used to help with breathing. It will be removed when you are awake and breathing on your own.  Heart trouble.  Lung trouble.  Before the procedure Staying hydrated Follow instructions from your health care provider about hydration, which may include:  Up to 2 hours before the procedure - you may continue to drink clear liquids, such as water, clear fruit juice, black coffee, and plain tea.  Eating and drinking restrictions Follow instructions from your health care provider about eating and drinking, which may include:  8 hours before the procedure - stop eating heavy meals or foods such as meat, fried foods, or fatty foods.  6 hours before the procedure - stop eating light meals or foods, such as toast or cereal.  6 hours before the procedure - stop drinking milk or  drinks that contain milk.  2 hours before the procedure - stop drinking clear liquids.  Medicines Ask your health care provider about:  Changing or stopping your regular medicines. This is especially important if you are taking diabetes medicines or blood thinners.  Taking medicines such as aspirin and ibuprofen. These medicines can thin your blood. Do not take these medicines before your procedure if your health care provider instructs you not to.  Tests and exams  You will have a physical exam.  You may have blood tests done to show: ? How well your kidneys and liver are working. ? How well your blood can clot.  General instructions  Plan to have someone take you home from the hospital or clinic.  If you will be going home right after the procedure, plan to have someone with you for 24 hours.  What happens during the procedure?  Your blood pressure, heart rate, breathing, level of pain and overall condition will be monitored.  An IV tube will be inserted into one of your veins.  Your anesthesia specialist will give you medicines as needed to keep you comfortable during the procedure. This may mean changing the level of sedation.  The procedure will be performed. After the procedure  Your blood pressure, heart rate, breathing rate, and blood oxygen level will be monitored until the medicines you were given have worn off.  Do not drive for 24 hours if you received a sedative.  You may: ? Feel sleepy, clumsy, or nauseous. ? Feel forgetful about what happened after the procedure. ? Have a sore throat if you had a breathing tube during the procedure. ? Vomit. This information is not intended to replace advice given to you by your health care provider. Make sure you discuss any questions you have with your health care provider. Document Released: 02/22/2005 Document Revised: 11/05/2015 Document Reviewed: 09/19/2015 Elsevier Interactive Patient Education  2018 [place], Care After These instructions provide you with information about caring for yourself after your procedure. Your health care provider may also give you more specific instructions. Your  treatment has been planned according to current medical practices, but problems sometimes occur. Call your health care provider if you have any problems or questions after your procedure. What can I expect after the procedure? After your procedure, it is common to:  Feel sleepy for several hours.  Feel clumsy and have poor balance for several hours.  Feel forgetful about what happened after the procedure.  Have poor judgment for several hours.  Feel nauseous or vomit.  Have a sore throat if you had a breathing tube during the procedure.  Follow these instructions at home: For at least 24 hours after the procedure:   Do not: ? Participate in activities in which you could fall or become injured. ? Drive. ? Use heavy machinery. ? Drink alcohol. ? Take sleeping pills or medicines that cause drowsiness. ? Make important decisions or sign legal documents. ? Take care of children on your own.  Rest. Eating and drinking  Follow the diet that is recommended by your health care provider.  If you vomit, drink water, juice, or soup when you can drink without vomiting.  Make sure you have little or no nausea before eating solid foods. General instructions  Have a responsible adult stay with you until you are awake and alert.  Take over-the-counter and prescription medicines only as told by your health care provider.  If you smoke, do not smoke without supervision.  Keep all follow-up visits as told by your health care provider. This is important. Contact a health care provider if:  You keep feeling nauseous or you keep vomiting.  You feel light-headed.  You develop a rash.  You have a fever. Get help right away if:  You have trouble breathing. This  information is not intended to replace advice given to you by your health care provider. Make sure you discuss any questions you have with your health care provider. Document Released: 09/19/2015 Document Revised: 01/19/2016 Document Reviewed: 09/19/2015 Elsevier Interactive Patient Education  [employer].

## 2018-03-07 NOTE — H&P (Signed)
Steve Henry; [id number]; 1981-04-18   HPI Patient is a 37 year old black male who was referred to my care by the emergency room for evaluation treatment of a lipoma on his scalp.  He states it has been present for approximately 1 year, but seems to have increased in size recently and is causing discomfort.  He was placed on antibiotics, but this has not resolved the tenderness in this region.  No drainage has been noted.  He has a pain of 7 out of 10. Past Medical History:  Diagnosis Date  . Chronic back pain   . Chronic hand pain   . DDD (degenerative disc disease), cervical   . De Quervain's tenosynovitis   . Drug-seeking behavior   . Narcotic dependence ([place])     Past Surgical History:  Procedure Laterality Date  . SPINAL FUSION      History reviewed. No pertinent family history.  Current Outpatient Medications on File Prior to Visit  Medication Sig Dispense Refill  . cephALEXin (KEFLEX) 500 MG capsule Take 1 capsule (500 mg total) by mouth 4 (four) times daily. 40 capsule 0  . cyclobenzaprine (FLEXERIL) 10 MG tablet Take 10 mg by mouth 3 (three) times daily.    Marland Kitchen HYDROcodone-acetaminophen (NORCO) 5-325 MG tablet Take 1 tablet by mouth every 4 (four) hours as needed for moderate pain. 12 tablet 0  . meloxicam (MOBIC) 15 MG tablet Take 15 mg by mouth daily.  3  . traZODone (DESYREL) 50 MG tablet Take 50 mg by mouth at bedtime.     No current facility-administered medications on file prior to visit.     Allergies  Allergen Reactions  . Tramadol Hives  . Latex Rash    Social History   Substance and Sexual Activity  Alcohol Use No    Social History   Tobacco Use  Smoking Status Current Every Day Smoker  . Packs/day: 0.50  . Years: 16.00  . Pack years: 8.00  . Types: Cigarettes  . Last attempt to quit: 12/20/2013  . Years since quitting: 4.2  Smokeless Tobacco Never Used    Review of Systems  Constitutional: Negative.   HENT: Negative.   Eyes: Positive  for blurred vision.  Respiratory: Negative.   Cardiovascular: Negative.   Gastrointestinal: Negative.   Genitourinary: Negative.   Musculoskeletal: Negative.   Skin: Negative.   Neurological: Positive for headaches.  Endo/Heme/Allergies: Negative.   Psychiatric/Behavioral: Negative.     Objective   Vitals:   03/07/18 0956  BP: (!) 144/80  Pulse: 71  Temp: 97.8 F (36.6 C)    Physical Exam  Constitutional: He is oriented to person, place, and time. He appears well-developed and well-nourished. No distress.  HENT:  Head: Normocephalic and atraumatic.  3.5 cm oval, rubbery soft tissue mass in the left frontal region, mobile.  No fluctuance present.  Cardiovascular: Normal rate, regular rhythm and normal heart sounds. Exam reveals no gallop.  No murmur heard. Pulmonary/Chest: Effort normal and breath sounds normal. No stridor. No respiratory distress. He has no wheezes. He has no rales.  Neurological: He is alert and oriented to person, place, and time.  Skin: Skin is warm and dry.  Vitals reviewed. ER note reviewed  Assessment  Lipoma, scalp Plan   Patient is scheduled for excision of the lipoma, scalp on 03/13/2018.  The risks and benefits of the procedure including bleeding, infection, and recurrence of the lipoma were fully explained to the patient, who gave informed consent.

## 2018-03-07 NOTE — Progress Notes (Signed)
Steve Henry; [id number]; Jul 08, 1980   HPI Patient is a 37 year old black male who was referred to my care by the emergency room for evaluation treatment of a lipoma on his scalp.  He states it has been present for approximately 1 year, but seems to have increased in size recently and is causing discomfort.  He was placed on antibiotics, but this has not resolved the tenderness in this region.  No drainage has been noted.  He has a pain of 7 out of 10. Past Medical History:  Diagnosis Date  . Chronic back pain   . Chronic hand pain   . DDD (degenerative disc disease), cervical   . De Quervain's tenosynovitis   . Drug-seeking behavior   . Narcotic dependence ([place])     Past Surgical History:  Procedure Laterality Date  . SPINAL FUSION      History reviewed. No pertinent family history.  Current Outpatient Medications on File Prior to Visit  Medication Sig Dispense Refill  . cephALEXin (KEFLEX) 500 MG capsule Take 1 capsule (500 mg total) by mouth 4 (four) times daily. 40 capsule 0  . cyclobenzaprine (FLEXERIL) 10 MG tablet Take 10 mg by mouth 3 (three) times daily.    Marland Kitchen HYDROcodone-acetaminophen (NORCO) 5-325 MG tablet Take 1 tablet by mouth every 4 (four) hours as needed for moderate pain. 12 tablet 0  . meloxicam (MOBIC) 15 MG tablet Take 15 mg by mouth daily.  3  . traZODone (DESYREL) 50 MG tablet Take 50 mg by mouth at bedtime.     No current facility-administered medications on file prior to visit.     Allergies  Allergen Reactions  . Tramadol Hives  . Latex Rash    Social History   Substance and Sexual Activity  Alcohol Use No    Social History   Tobacco Use  Smoking Status Current Every Day Smoker  . Packs/day: 0.50  . Years: 16.00  . Pack years: 8.00  . Types: Cigarettes  . Last attempt to quit: 12/20/2013  . Years since quitting: 4.2  Smokeless Tobacco Never Used    Review of Systems  Constitutional: Negative.   HENT: Negative.   Eyes: Positive  for blurred vision.  Respiratory: Negative.   Cardiovascular: Negative.   Gastrointestinal: Negative.   Genitourinary: Negative.   Musculoskeletal: Negative.   Skin: Negative.   Neurological: Positive for headaches.  Endo/Heme/Allergies: Negative.   Psychiatric/Behavioral: Negative.     Objective   Vitals:   03/07/18 0956  BP: (!) 144/80  Pulse: 71  Temp: 97.8 F (36.6 C)    Physical Exam  Constitutional: He is oriented to person, place, and time. He appears well-developed and well-nourished. No distress.  HENT:  Head: Normocephalic and atraumatic.  3.5 cm oval, rubbery soft tissue mass in the left frontal region, mobile.  No fluctuance present.  Cardiovascular: Normal rate, regular rhythm and normal heart sounds. Exam reveals no gallop.  No murmur heard. Pulmonary/Chest: Effort normal and breath sounds normal. No stridor. No respiratory distress. He has no wheezes. He has no rales.  Neurological: He is alert and oriented to person, place, and time.  Skin: Skin is warm and dry.  Vitals reviewed. ER note reviewed  Assessment  Lipoma, scalp Plan   Patient is scheduled for excision of the lipoma, scalp on 03/13/2018.  The risks and benefits of the procedure including bleeding, infection, and recurrence of the lipoma were fully explained to the patient, who gave informed consent.

## 2018-03-07 NOTE — Patient Instructions (Signed)
Lipoma A lipoma is a noncancerous (benign) tumor that is made up of fat cells. This is a very common type of soft-tissue growth. Lipomas are usually found under the skin (subcutaneous). They may occur in any tissue of the body that contains fat. Common areas for lipomas to appear include the back, shoulders, buttocks, and thighs. Lipomas grow slowly, and they are usually painless. Most lipomas do not cause problems and do not require treatment. What are the causes? The cause of this condition is not known. What increases the risk? This condition is more likely to develop in:  People who are 40-60 years old.  People who have a family history of lipomas.  What are the signs or symptoms? A lipoma usually appears as a small, round bump under the skin. It may feel soft or rubbery, but the firmness can vary. Most lipomas are not painful. However, a lipoma may become painful if it is located in an area where it pushes on nerves. How is this diagnosed? A lipoma can usually be diagnosed with a physical exam. You may also have tests to confirm the diagnosis and to rule out other conditions. Tests may include:  Imaging tests, such as a CT scan or MRI.  Removal of a tissue sample to be looked at under a microscope (biopsy).  How is this treated? Treatment is not needed for small lipomas that are not causing problems. If a lipoma continues to get bigger or it causes problems, removal is often the best option. Lipomas can also be removed to improve appearance. Removal of a lipoma is usually done with a surgery in which the fatty cells and the surrounding capsule are removed. Most often, a medicine that numbs the area (local anesthetic) is used for this procedure. Follow these instructions at home:  Keep all follow-up visits as directed by your health care provider. This is important. Contact a health care provider if:  Your lipoma becomes larger or hard.  Your lipoma becomes painful, red, or  increasingly swollen. These could be signs of infection or a more serious condition. This information is not intended to replace advice given to you by your health care provider. Make sure you discuss any questions you have with your health care provider. Document Released: 05/19/2002 Document Revised: 11/04/2015 Document Reviewed: 05/25/2014 Elsevier Interactive Patient Education  2018 Elsevier Inc[employer]  

## 2018-03-11 ENCOUNTER — Other Ambulatory Visit: Payer: Self-pay

## 2018-03-11 ENCOUNTER — Encounter (HOSPITAL_COMMUNITY): Payer: Self-pay

## 2018-03-11 ENCOUNTER — Encounter (HOSPITAL_COMMUNITY)
Admission: RE | Admit: 2018-03-11 | Discharge: 2018-03-11 | Disposition: A | Discharge status: Home / Self Care | Payer: Medicaid Other | Source: Ambulatory Visit | Attending: General Surgery | Admitting: General Surgery

## 2018-03-11 DIAGNOSIS — Z01812 Encounter for preprocedural laboratory examination: Secondary | ICD-10-CM | POA: Diagnosis present

## 2018-03-11 LAB — CBC WITH DIFFERENTIAL/PLATELET
Basophils Absolute: 0 10*3/uL (ref 0.0–0.1)
Basophils Relative: 0 %
EOS ABS: 0.3 10*3/uL (ref 0.0–0.7)
Eosinophils Relative: 4 %
HCT: 43.4 % (ref 39.0–52.0)
HEMOGLOBIN: 14.6 g/dL (ref 13.0–17.0)
LYMPHS ABS: 2.2 10*3/uL (ref 0.7–4.0)
Lymphocytes Relative: 26 %
MCH: 29.1 pg (ref 26.0–34.0)
MCHC: 33.6 g/dL (ref 30.0–36.0)
MCV: 86.6 fL (ref 78.0–100.0)
Monocytes Absolute: 0.6 10*3/uL (ref 0.1–1.0)
Monocytes Relative: 7 %
NEUTROS ABS: 5.3 10*3/uL (ref 1.7–7.7)
NEUTROS PCT: 63 %
Platelets: 333 10*3/uL (ref 150–400)
RBC: 5.01 MIL/uL (ref 4.22–5.81)
RDW: 12.6 % (ref 11.5–15.5)
WBC: 8.5 10*3/uL (ref 4.0–10.5)

## 2018-03-11 LAB — BASIC METABOLIC PANEL
Anion gap: 9 (ref 5–15)
BUN: 9 mg/dL (ref 6–20)
CHLORIDE: 108 mmol/L (ref 98–111)
CO2: 21 mmol/L — ABNORMAL LOW (ref 22–32)
Calcium: 9 mg/dL (ref 8.9–10.3)
Creatinine, Ser: 0.99 mg/dL (ref 0.61–1.24)
GFR calc Af Amer: >60 mL/min (ref >60)
GFR calc non Af Amer: >60 mL/min (ref >60)
Glucose, Bld: 133 mg/dL — ABNORMAL HIGH (ref 70–99)
Potassium: 3.5 mmol/L (ref 3.5–5.1)
SODIUM: 138 mmol/L (ref 135–145)

## 2018-03-13 ENCOUNTER — Encounter (HOSPITAL_COMMUNITY): Payer: Self-pay | Admitting: Anesthesiology

## 2018-03-13 ENCOUNTER — Ambulatory Visit (HOSPITAL_COMMUNITY)
Admission: RE | Admit: 2018-03-13 | Discharge: 2018-03-13 | Disposition: A | Discharge status: Home / Self Care | Payer: Medicaid Other | Source: Ambulatory Visit | Attending: General Surgery | Admitting: General Surgery

## 2018-03-13 ENCOUNTER — Encounter (HOSPITAL_COMMUNITY)
Admission: RE | Disposition: A | Discharge status: Home / Self Care | Payer: Self-pay | Source: Ambulatory Visit | Attending: General Surgery

## 2018-03-13 ENCOUNTER — Ambulatory Visit: Payer: Medicaid Other | Admitting: Physical Therapy

## 2018-03-13 ENCOUNTER — Ambulatory Visit (HOSPITAL_COMMUNITY): Payer: Medicaid Other | Admitting: Anesthesiology

## 2018-03-13 DIAGNOSIS — Z91040 Latex allergy status: Secondary | ICD-10-CM | POA: Insufficient documentation

## 2018-03-13 DIAGNOSIS — G8929 Other chronic pain: Secondary | ICD-10-CM | POA: Insufficient documentation

## 2018-03-13 DIAGNOSIS — M654 Radial styloid tenosynovitis [de Quervain]: Secondary | ICD-10-CM | POA: Insufficient documentation

## 2018-03-13 DIAGNOSIS — Z981 Arthrodesis status: Secondary | ICD-10-CM | POA: Diagnosis not present

## 2018-03-13 DIAGNOSIS — D170 Benign lipomatous neoplasm of skin and subcutaneous tissue of head, face and neck: Secondary | ICD-10-CM | POA: Insufficient documentation

## 2018-03-13 DIAGNOSIS — M5030 Other cervical disc degeneration, unspecified cervical region: Secondary | ICD-10-CM | POA: Diagnosis not present

## 2018-03-13 DIAGNOSIS — Z79899 Other long term (current) drug therapy: Secondary | ICD-10-CM | POA: Insufficient documentation

## 2018-03-13 DIAGNOSIS — D1779 Benign lipomatous neoplasm of other sites: Secondary | ICD-10-CM

## 2018-03-13 DIAGNOSIS — M549 Dorsalgia, unspecified: Secondary | ICD-10-CM | POA: Diagnosis not present

## 2018-03-13 DIAGNOSIS — F1120 Opioid dependence, uncomplicated: Secondary | ICD-10-CM | POA: Insufficient documentation

## 2018-03-13 DIAGNOSIS — F17210 Nicotine dependence, cigarettes, uncomplicated: Secondary | ICD-10-CM | POA: Diagnosis not present

## 2018-03-13 DIAGNOSIS — Z888 Allergy status to other drugs, medicaments and biological substances status: Secondary | ICD-10-CM | POA: Insufficient documentation

## 2018-03-13 DIAGNOSIS — D179 Benign lipomatous neoplasm, unspecified: Secondary | ICD-10-CM

## 2018-03-13 HISTORY — PX: MASS EXCISION: SHX2000

## 2018-03-13 SURGERY — EXCISION MASS
Anesthesia: Monitor Anesthesia Care

## 2018-03-13 MED ORDER — PROPOFOL 10 MG/ML IV BOLUS
INTRAVENOUS | Status: DC | PRN
  Administered 2018-03-13: 20 mg via INTRAVENOUS

## 2018-03-13 MED ORDER — PROPOFOL 10 MG/ML IV BOLUS
INTRAVENOUS | Status: AC
  Filled 2018-03-13: qty 40

## 2018-03-13 MED ORDER — FENTANYL CITRATE (PF) 100 MCG/2ML IJ SOLN
INTRAMUSCULAR | Status: AC
  Filled 2018-03-13: qty 2

## 2018-03-13 MED ORDER — BACITRACIN-NEOMYCIN-POLYMYXIN 400-5-5000 EX OINT
TOPICAL_OINTMENT | CUTANEOUS | Status: AC
  Filled 2018-03-13: qty 1

## 2018-03-13 MED ORDER — LACTATED RINGERS IV SOLN: INTRAVENOUS | Status: DC

## 2018-03-13 MED ORDER — 0.9 % SODIUM CHLORIDE (POUR BTL) OPTIME
TOPICAL | Status: DC | PRN
  Administered 2018-03-13: 1000 mL

## 2018-03-13 MED ORDER — PROMETHAZINE HCL 25 MG/ML IJ SOLN: 6.2500 mg | INTRAMUSCULAR | Status: DC | PRN

## 2018-03-13 MED ORDER — SUGAMMADEX SODIUM 200 MG/2ML IV SOLN
INTRAVENOUS | Status: AC
  Filled 2018-03-13: qty 2

## 2018-03-13 MED ORDER — PROPOFOL 500 MG/50ML IV EMUL
INTRAVENOUS | Status: DC | PRN
  Administered 2018-03-13: 150 ug/kg/min via INTRAVENOUS

## 2018-03-13 MED ORDER — FENTANYL CITRATE (PF) 100 MCG/2ML IJ SOLN
INTRAMUSCULAR | Status: DC | PRN
  Administered 2018-03-13: 25 ug via INTRAVENOUS

## 2018-03-13 MED ORDER — HYDROMORPHONE HCL 1 MG/ML IJ SOLN: 0.2500 mg | INTRAMUSCULAR | Status: DC | PRN

## 2018-03-13 MED ORDER — HYDROCODONE-ACETAMINOPHEN 5-325 MG PO TABS: 1.0000 | ORAL_TABLET | ORAL | 0 refills | Status: DC | PRN

## 2018-03-13 MED ORDER — CHLORHEXIDINE GLUCONATE CLOTH 2 % EX PADS: 6.0000 | MEDICATED_PAD | Freq: Once | CUTANEOUS | Status: DC

## 2018-03-13 MED ORDER — LACTATED RINGERS IV SOLN
INTRAVENOUS | Status: DC
  Administered 2018-03-13: 1000 mL via INTRAVENOUS

## 2018-03-13 MED ORDER — PROPOFOL 10 MG/ML IV BOLUS
INTRAVENOUS | Status: AC
  Filled 2018-03-13: qty 20

## 2018-03-13 MED ORDER — HYDROCODONE-ACETAMINOPHEN 7.5-325 MG PO TABS: 1.0000 | ORAL_TABLET | Freq: Once | ORAL | Status: DC | PRN

## 2018-03-13 MED ORDER — POVIDONE-IODINE 10 % EX OINT
TOPICAL_OINTMENT | CUTANEOUS | Status: AC
  Filled 2018-03-13: qty 1

## 2018-03-13 MED ORDER — MEPERIDINE HCL 50 MG/ML IJ SOLN: 6.2500 mg | INTRAMUSCULAR | Status: DC | PRN

## 2018-03-13 MED ORDER — BUPIVACAINE HCL (PF) 0.5 % IJ SOLN
INTRAMUSCULAR | Status: DC | PRN
  Administered 2018-03-13: 2 mL

## 2018-03-13 MED ORDER — KETOROLAC TROMETHAMINE 30 MG/ML IJ SOLN: 30.0000 mg | Freq: Once | INTRAMUSCULAR | Status: DC

## 2018-03-13 MED ORDER — MIDAZOLAM HCL 5 MG/5ML IJ SOLN
INTRAMUSCULAR | Status: DC | PRN
  Administered 2018-03-13: 2 mg via INTRAVENOUS

## 2018-03-13 MED ORDER — MIDAZOLAM HCL 2 MG/2ML IJ SOLN
INTRAMUSCULAR | Status: AC
  Filled 2018-03-13: qty 2

## 2018-03-13 MED ORDER — BACITRACIN-NEOMYCIN-POLYMYXIN OINTMENT TUBE
TOPICAL_OINTMENT | CUTANEOUS | Status: DC | PRN
  Administered 2018-03-13: 1 via TOPICAL

## 2018-03-13 MED ORDER — BUPIVACAINE HCL (PF) 0.5 % IJ SOLN
INTRAMUSCULAR | Status: AC
  Filled 2018-03-13: qty 30

## 2018-03-13 SURGICAL SUPPLY — 39 items
ADH SKN CLS APL DERMABOND .7 (GAUZE/BANDAGES/DRESSINGS)
BANDAID FLEXIBLE 1X3 (GAUZE/BANDAGES/DRESSINGS) ×2 IMPLANT
BLADE SURG SZ11 CARB STEEL (BLADE) IMPLANT
CHLORAPREP W/TINT 10.5 ML (MISCELLANEOUS) ×3 IMPLANT
CLOTH BEACON ORANGE TIMEOUT ST (SAFETY) ×3 IMPLANT
COVER LIGHT HANDLE STERIS (MISCELLANEOUS) ×6 IMPLANT
DECANTER SPIKE VIAL GLASS SM (MISCELLANEOUS) ×3 IMPLANT
DERMABOND ADVANCED (GAUZE/BANDAGES/DRESSINGS)
DERMABOND ADVANCED .7 DNX12 (GAUZE/BANDAGES/DRESSINGS) IMPLANT
DRAPE EENT ADH APERT 31X51 STR (DRAPES) IMPLANT
ELECT NDL TIP 2.8 STRL (NEEDLE) IMPLANT
ELECT NEEDLE TIP 2.8 STRL (NEEDLE) IMPLANT
ELECT REM PT RETURN 9FT ADLT (ELECTROSURGICAL) ×3
ELECTRODE REM PT RTRN 9FT ADLT (ELECTROSURGICAL) ×1 IMPLANT
GAUZE SPONGE 4X4 12PLY STRL (GAUZE/BANDAGES/DRESSINGS) ×3 IMPLANT
GLOVE BIOGEL M 7.0 STRL (GLOVE) ×2 IMPLANT
GLOVE BIOGEL PI IND STRL 7.0 (GLOVE) ×1 IMPLANT
GLOVE BIOGEL PI INDICATOR 7.0 (GLOVE) ×2
GLOVE ECLIPSE 6.5 STRL STRAW (GLOVE) ×2 IMPLANT
GLOVE ECLIPSE 7.5 STRL STRAW (GLOVE) ×2 IMPLANT
GLOVE SURG SS PI 7.5 STRL IVOR (GLOVE) ×3 IMPLANT
GOWN STRL REUS W/ TWL XL LVL3 (GOWN DISPOSABLE) ×1 IMPLANT
GOWN STRL REUS W/TWL LRG LVL3 (GOWN DISPOSABLE) ×3 IMPLANT
GOWN STRL REUS W/TWL XL LVL3 (GOWN DISPOSABLE) ×3
KIT TURNOVER KIT A (KITS) ×3 IMPLANT
MANIFOLD NEPTUNE II (INSTRUMENTS) ×3 IMPLANT
NDL HYPO 25X1 1.5 SAFETY (NEEDLE) ×1 IMPLANT
NEEDLE HYPO 25X1 1.5 SAFETY (NEEDLE) ×3 IMPLANT
NS IRRIG 1000ML POUR BTL (IV SOLUTION) ×3 IMPLANT
PACK MINOR (CUSTOM PROCEDURE TRAY) ×3 IMPLANT
PAD ARMBOARD 7.5X6 YLW CONV (MISCELLANEOUS) ×3 IMPLANT
SET BASIN LINEN APH (SET/KITS/TRAYS/PACK) ×3 IMPLANT
SUT ETHILON 3 0 FSL (SUTURE) IMPLANT
SUT MNCRL AB 4-0 PS2 18 (SUTURE) IMPLANT
SUT PROLENE 4 0 PS 2 18 (SUTURE) ×6 IMPLANT
SUT VIC AB 3-0 SH 27 (SUTURE)
SUT VIC AB 3-0 SH 27X BRD (SUTURE) IMPLANT
SUT VIC AB 4-0 PS2 27 (SUTURE) ×2 IMPLANT
SYR CONTROL 10ML LL (SYRINGE) ×3 IMPLANT

## 2018-03-13 NOTE — Op Note (Signed)
Patient:  RYOMA NOFZIGER  DOB:  September 15, 1980  MRN:  [id number]   Preop Diagnosis: Lipoma, scalp  Postop Diagnosis: Same  Procedure: Excision of lipoma, scalp  Surgeon: Aviva Signs, MD  Anes: MAC  Indications: Patient is a 37 year old black male who presents with an enlarging subcutaneous mass over the left frontal region of the scalp.  The risks and benefits of the procedure including bleeding, infection, and recurrence of the mass were fully explained to the patient, who gave informed consent.  Procedure note: The patient was placed in supine position.  The left frontal region where the mass was located was shaved to remove the overlying hair.  0.5% Sensorcaine was used for local anesthesia.  A transverse incision was made over the hairline.  A 3 cm incision was made.  The dissection was taken down to the subcutaneous mass.  This did appear to be a lipoma.  It was excised without difficulty.  It was sent to pathology for further examination.  The subcutaneous soft tissue and muscle was reapproximated using 3-0 Vicryl interrupted suture.  The skin was closed using 4-0 Prolene interrupted sutures.  Triple antibiotic ointment and a dry sterile dressing were applied.  All tape and needle counts were correct at the end of the procedure.  Patient was awakened and transferred to PACU in stable condition.  Complications: None  EBL: Minimal  Specimen: Lipoma, scalp

## 2018-03-13 NOTE — Anesthesia Postprocedure Evaluation (Signed)
Anesthesia Post Note  Patient: Steve Henry  Procedure(s) Performed: EXCISION LIPOMA SCALP (N/A )  Patient location during evaluation: PACU Anesthesia Type: MAC Level of consciousness: awake and alert and oriented Pain management: pain level controlled Vital Signs Assessment: post-procedure vital signs reviewed and stable Respiratory status: spontaneous breathing Cardiovascular status: blood pressure returned to baseline and stable Postop Assessment: no apparent nausea or vomiting Anesthetic complications: no     Last Vitals:  Vitals:   03/13/18 0830 03/13/18 0857  BP: 100/82 122/85  Pulse:  87  Resp:  18  Temp:  36.5 C  SpO2:  99%    Last Pain:  Vitals:   03/13/18 0857  TempSrc: Oral  PainSc: 7                  Evea Sheek

## 2018-03-13 NOTE — Transfer of Care (Signed)
Immediate Anesthesia Transfer of Care Note  Patient: Steve Henry  Procedure(s) Performed: EXCISION LIPOMA SCALP (N/A )  Patient Location: PACU  Anesthesia Type:MAC  Level of Consciousness: awake, alert  and oriented  Airway & Oxygen Therapy: Patient Spontanous Breathing  Post-op Assessment: Report given to RN  Post vital signs: Reviewed and stable  Last Vitals:  Vitals Value Taken Time  BP 107/73 03/13/2018  8:23 AM  Temp    Pulse 84 03/13/2018  8:25 AM  Resp 16 03/13/2018  8:25 AM  SpO2 95 % 03/13/2018  8:25 AM  Vitals shown include unvalidated device data.  Last Pain:  Vitals:   03/13/18 0645  PainSc: 0-No pain         Complications: No apparent anesthesia complications

## 2018-03-13 NOTE — Anesthesia Preprocedure Evaluation (Signed)
Anesthesia Evaluation  Patient identified by MRN, date of birth, ID band Patient awake    Reviewed: Allergy & Precautions, H&P , NPO status , Patient's Chart, lab work & pertinent test results, reviewed documented beta blocker date and time   Airway Mallampati: III  TM Distance: >3 FB Neck ROM: full    Dental no notable dental hx. (+) Teeth Intact   Pulmonary neg pulmonary ROS, Current Smoker,    Pulmonary exam normal breath sounds clear to auscultation       Cardiovascular Exercise Tolerance: Good negative cardio ROS   Rhythm:regular Rate:Normal     Neuro/Psych negative neurological ROS  negative psych ROS   GI/Hepatic negative GI ROS, Neg liver ROS,   Endo/Other  negative endocrine ROS  Renal/GU negative Renal ROS  negative genitourinary   Musculoskeletal   Abdominal   Peds  Hematology negative hematology ROS (+)   Anesthesia Other Findings Chronic back/spine pain following fusion  Reproductive/Obstetrics negative OB ROS                             Anesthesia Physical Anesthesia Plan  ASA: II  Anesthesia Plan: MAC   Post-op Pain Management:    Induction:   PONV Risk Score and Plan:   Airway Management Planned:   Additional Equipment:   Intra-op Plan:   Post-operative Plan:   Informed Consent: I have reviewed the patients History and Physical, chart, labs and discussed the procedure including the risks, benefits and alternatives for the proposed anesthesia with the patient or authorized representative who has indicated his/her understanding and acceptance.   Dental Advisory Given  Plan Discussed with: CRNA and Anesthesiologist  Anesthesia Plan Comments:         Anesthesia Quick Evaluation

## 2018-03-13 NOTE — Interval H&P Note (Signed)
History and Physical Interval Note:  03/13/2018 7:09 AM  Steve Henry  has presented today for surgery, with the diagnosis of lipoma on scalp  The various methods of treatment have been discussed with the patient and family. After consideration of risks, benefits and other options for treatment, the patient has consented to  Procedure(s) with comments: EXCISION LIPOMA SCALP (N/A) - pt knows to arrive at 6:15 as a surgical intervention .  The patient's history has been reviewed, patient examined, no change in status, stable for surgery.  I have reviewed the patient's chart and labs.  Questions were answered to the patient's satisfaction.     Aviva Signs

## 2018-03-13 NOTE — Discharge Instructions (Addendum)
Keep wound clean and dry with soap and water. Epidermal Cyst Removal, Care After Refer to this sheet in the next few weeks. These instructions provide you with information about caring for yourself after your procedure. Your health care provider may also give you more specific instructions. Your treatment has been planned according to current medical practices, but problems sometimes occur. Call your health care provider if you have any problems or questions after your procedure. What can I expect after the procedure? After the procedure, it is common to have:  Soreness in the area where your cyst was removed.  Tightness or itching from your skin sutures.  Follow these instructions at home:  Take medicines only as directed by your health care provider.  If you were prescribed an antibiotic medicine, finish all of it even if you start to feel better.  Use antibiotic ointment as directed by your health care provider. Follow the instructions carefully.  There are many different ways to close and cover an incision, including stitches (sutures), skin glue, and adhesive strips. Follow your health care provider's instructions about: ? Incision care. ? Bandage (dressing) changes and removal. ? Incision closure removal.  Keep the bandage (dressing) dry until your health care provider says that it can be removed. Take sponge baths only. Ask your health care provider when you can start showering or taking a bath.  After your dressing is off, check your incision every day for signs of infection. Watch for: ? Redness, swelling, or pain. ? Fluid, blood, or pus.  You can return to your normal activities. Do not do anything that stretches or puts pressure on your incision.  You can return to your normal diet.  Keep all follow-up visits as directed by your health care provider. This is important. Contact a health care provider if:  You have a fever.  Your incision bleeds.  You have redness,  swelling, or pain in the incision area.  You have fluid, blood, or pus coming from your incision.  Your cyst comes back after surgery. This information is not intended to replace advice given to you by your health care provider. Make sure you discuss any questions you have with your health care provider. Document Released: 06/19/2014 Document Revised: 11/04/2015 Document Reviewed: 02/11/2014 Elsevier Interactive Patient Education  2018 [facility] POST-ANESTHESIA  IMMEDIATELY FOLLOWING SURGERY:  Do not drive or operate machinery for the first twenty four hours after surgery.  Do not make any important decisions for twenty four hours after surgery or while taking narcotic pain medications or sedatives.  If you develop intractable nausea and vomiting or a severe headache please notify your doctor immediately.  FOLLOW-UP:  Please make an appointment with your surgeon as instructed. You do not need to follow up with anesthesia unless specifically instructed to do so.  WOUND CARE INSTRUCTIONS (if applicable):  Keep a dry clean dressing on the anesthesia/puncture wound site if there is drainage.  Once the wound has quit draining you may leave it open to air.  Generally you should leave the bandage intact for twenty four hours unless there is drainage.  If the epidural site drains for more than 36-48 hours please call the anesthesia department.  QUESTIONS?:  Please feel free to call your physician or the hospital operator if you have any questions, and they will be happy to assist you.

## 2018-03-14 ENCOUNTER — Encounter (HOSPITAL_COMMUNITY): Payer: Self-pay | Admitting: General Surgery

## 2018-03-20 ENCOUNTER — Encounter: Payer: Self-pay | Admitting: Physical Therapy

## 2018-03-20 ENCOUNTER — Ambulatory Visit: Payer: Medicaid Other | Attending: Family Medicine | Admitting: Physical Therapy

## 2018-03-20 DIAGNOSIS — R262 Difficulty in walking, not elsewhere classified: Secondary | ICD-10-CM | POA: Diagnosis present

## 2018-03-20 DIAGNOSIS — M542 Cervicalgia: Secondary | ICD-10-CM | POA: Diagnosis present

## 2018-03-20 DIAGNOSIS — M6281 Muscle weakness (generalized): Secondary | ICD-10-CM | POA: Diagnosis present

## 2018-03-20 NOTE — Therapy (Signed)
[address] [place], [place], [address] Phone: [phone]   Fax:  [phone]  Physical Therapy Treatment  Patient Details  Name: Steve Henry MRN: [id number] Date of Birth: 10/18/1980 Referring Provider (PT): Salvatore Marvel   Encounter Date: 03/20/2018  PT End of Session - 03/20/18 1707    Visit Number  1    Authorization Type  Medicaid    PT Start Time  2353    PT Stop Time  1700    PT Time Calculation (min)  55 min    Activity Tolerance  Patient limited by pain;Treatment limited secondary to medical complications (Comment)    Behavior During Therapy  [facility] for tasks assessed/performed       Past Medical History:  Diagnosis Date  . Chronic back pain   . Chronic hand pain   . DDD (degenerative disc disease), cervical   . De Quervain's tenosynovitis   . Drug-seeking behavior   . Narcotic dependence ([place])     Past Surgical History:  Procedure Laterality Date  . MASS EXCISION N/A 03/13/2018   Procedure: EXCISION LIPOMA SCALP;  Surgeon: Aviva Signs, MD;  Location: [facility];  Service: General;  Laterality: N/A;  pt knows to arrive at 6:15  . SPINAL FUSION      There were no vitals filed for this visit.  Subjective Assessment - 03/20/18 1613    Subjective  Patient reports that he underwent ACDF in 2017 at [facility] due to paralysis, he reports that he had been having arm pain and arm numbness.  He reports that after the surgery he had some better use of his hands, reports that after the surgery he has had issues with being able to walk, leg weakness, he reports that he had some PT after the surgery.  Reports that over teh past year, he has gotten worse with increased pain, decreased use and function, reports increased numbness.      Limitations  Standing;Walking;House hold activities;Lifting    Patient Stated Goals  be stronger, have less pain    Currently in Pain?  Yes    Pain Score  8     Pain  Location  Neck   arms and back   Pain Descriptors / Indicators  Aching;Pins and needles;Tingling;Numbness    Pain Type  Chronic pain    Pain Radiating Towards  numnbess and tingling, Arms and legs, c/o weakness    Pain Onset  More than a month ago    Pain Frequency  Constant    Aggravating Factors   activity  pain up to 10/10    Pain Relieving Factors  reports nothing really helps the pain    Effect of Pain on Daily Activities  limits everything         [facility] PT Assessment - 03/20/18 0001      Assessment   Medical Diagnosis  cervical myelopathy, weakness    Referring Provider (PT)  [place]    Onset Date/Surgical Date  02/18/18    Hand Dominance  Right    Prior Therapy  two years ago after ACDF      Precautions   Precautions  None      Balance Screen   Has the patient fallen in the past 6 months  Yes    How many times?  4    Has the patient had a decrease in activity level because of a fear of falling?   Yes  Is the patient reluctant to leave their home because of a fear of falling?   No      Home Environment   Additional Comments  has some stairs at home, has a 39 year old and a 39 year old      Prior Function   Level of Independence  Independent with household mobility with device;Requires assistive device for independence;Needs assistance with homemaking    Vocation  Unemployed    Leisure  no exercise      Posture/Postural Control   Posture Comments  fwd head, rounded shoulders      ROM / Strength   AROM / PROM / Strength  AROM;Strength      AROM   Overall AROM Comments  he has great difficulty moving his extremities, reports that he tells it to move and thinks that is is moving but it does not, he reports that his legs give out daily, reports difficulty with all ADL's, reports that he has help dressing and grooming.      Strength   Overall Strength Comments  hips 3-/5, knees 3-/5, ankles 2/5, elbows 2+/5, shoulders 2/5, grip was 1# for right and left       Transfers   Comments  at times needs Mod A to stand from sitting, he has ataxic and shaky motions of the arms and legs      Ambulation/Gait   Gait Comments  uses a SPC in the right hand, legs remain in some flexion, there were times that the left knee seems to buckle, with walking in the clinic he needed two times Mod A to catch self from left knee buckling      Standardized Balance Assessment   Standardized Balance Assessment  Timed Up and Go Test      Timed Up and Go Test   Normal TUG (seconds)  85                                PT Long Term Goals - 03/20/18 1734      PT LONG TERM GOAL #1   Title  Patient independent with HEP    Time  12    Period  Weeks    Status  New      PT LONG TERM GOAL #2   Title  patient will decrease TUG time to 40 seconds    Time  12    Period  Weeks    Status  New      PT LONG TERM GOAL #3   Title  be able to transfer without any help    Time  12    Period  Weeks    Status  New      PT LONG TERM GOAL #4   Title  increase grip strength to 10#    Time  12    Period  Weeks    Status  New      PT LONG TERM GOAL #5   Title  tolerate walking 300 feet with SPC or walker    Time  12    Period  Weeks    Status  New            Plan - 03/20/18 1709    Clinical Impression Statement  Patient reports that he was having some arm pain and numbness prior to an onset of complete UE and LE paralysis in 2017, he underwent an emergency ACDF  C4-7.  He reports that he had some PT after but none in the past 20 months, he reports that he has started having increaed difficulty with ADL's needing help to dress and groom.  He walks with a SPC, very unsteady, twice needed Mod A to keep from falling due to the left leg giving out.  His use of the arms and legs are severely limited, minimal ROM and minimal strength, grip strength was 1# bilaterally.  Has ataxic and shaking with UE and LE motions.  His TUG time was 85 seconds and during that  required 2 x where his left knee buckled.  I feel that he needs something safer and better, he has and old cane and cannot grip the cane and has wrist pain, I asked about a walker or an electric chair.    History and Personal Factors relevant to plan of care:  ACDF C4-7 2017    Clinical Presentation  Evolving    Clinical Decision Making  Moderate    Rehab Potential  Fair    PT Frequency  1x / week    PT Duration  12 weeks    PT Treatment/Interventions  ADLs/Self Care Home Management;Cryotherapy;Electrical Stimulation;Moist Heat;Ultrasound;Gait training;Neuromuscular re-education;Balance training;Therapeutic exercise;Therapeutic activities;Functional mobility training;Stair training;Patient/family education;Manual techniques    PT Next Visit Plan  Try to get him moving without causing pain    Consulted and Agree with Plan of Care  Patient       Patient will benefit from skilled therapeutic intervention in order to improve the following deficits and impairments:  Decreased coordination, Abnormal gait, Decreased range of motion, Difficulty walking, Impaired UE functional use, Increased muscle spasms, Decreased activity tolerance, Pain, Decreased balance, Decreased mobility, Decreased strength, Postural dysfunction  Visit Diagnosis: Muscle weakness (generalized) - Plan: PT plan of care cert/re-cert  Difficulty in walking, not elsewhere classified - Plan: PT plan of care cert/re-cert  Cervicalgia - Plan: PT plan of care cert/re-cert     Problem List Patient Active Problem List   Diagnosis Date Noted  . Lipoma   . Numbness 09/21/2015    Sumner Boast., PT 03/20/2018, 5:40 PM  [address] [place], [place], [address] Phone: [phone]   Fax:  [phone]  Name: Steve Henry MRN: [id number] Date of Birth: 1980/12/07

## 2018-03-21 ENCOUNTER — Other Ambulatory Visit: Payer: Self-pay | Admitting: General Surgery

## 2018-03-21 ENCOUNTER — Telehealth: Payer: Self-pay | Admitting: Emergency Medicine

## 2018-03-21 MED ORDER — HYDROCODONE-ACETAMINOPHEN 5-325 MG PO TABS: 1.0000 | ORAL_TABLET | ORAL | 0 refills | Status: DC | PRN

## 2018-03-21 NOTE — Telephone Encounter (Signed)
Patient called and stated he wanted a refill on his pain medication, I notified him that the provider will send in 1 refill to his pharmacy this afternoon but after this the provider will not refill the medication anymore. He asked that the rx be sent in to [facility] in [place]

## 2018-03-26 ENCOUNTER — Ambulatory Visit (INDEPENDENT_AMBULATORY_CARE_PROVIDER_SITE_OTHER): Payer: Self-pay | Admitting: General Surgery

## 2018-03-26 ENCOUNTER — Encounter: Payer: Self-pay | Admitting: General Surgery

## 2018-03-26 VITALS — BP 139/83 | HR 84 | Temp 97.7°F | Resp 18 | Wt 167.4 lb

## 2018-03-26 DIAGNOSIS — Z09 Encounter for follow-up examination after completed treatment for conditions other than malignant neoplasm: Secondary | ICD-10-CM

## 2018-03-26 MED ORDER — HYDROCODONE-ACETAMINOPHEN 5-325 MG PO TABS: 1.0000 | ORAL_TABLET | ORAL | 0 refills | Status: DC | PRN

## 2018-03-26 NOTE — Progress Notes (Signed)
Subjective:     Steve Henry  Status post excision of lipoma on scalp.  Patient states his preoperative symptoms have resolved.  He has mild incisional pain.  His headaches are resolving. Objective:    BP 139/83 (BP Location: Left Arm, Patient Position: Sitting, Cuff Size: Normal)   Pulse 84   Temp 97.7 F (36.5 C) (Temporal)   Resp 18   Wt 167 lb 6.4 oz (75.9 kg)   BMI 32.69 kg/m   General:  alert, cooperative and no distress  Incision healing well.  Sutures removed. Final pathology consistent with diagnosis.     Assessment:    Doing well postoperatively.    Plan:   A smaller amount of hydrocodone has been prescribed for pain.  Follow-up here as needed.  Patient pleased with the results.

## 2018-04-03 ENCOUNTER — Ambulatory Visit: Payer: Medicaid Other | Admitting: Physical Therapy

## 2018-04-03 ENCOUNTER — Encounter: Payer: Self-pay | Admitting: Physical Therapy

## 2018-04-03 DIAGNOSIS — R262 Difficulty in walking, not elsewhere classified: Secondary | ICD-10-CM

## 2018-04-03 DIAGNOSIS — M6281 Muscle weakness (generalized): Secondary | ICD-10-CM | POA: Diagnosis not present

## 2018-04-03 DIAGNOSIS — M542 Cervicalgia: Secondary | ICD-10-CM

## 2018-04-03 NOTE — Therapy (Signed)
[facility] [place], [place], [address] Phone: [phone]   Fax:  [phone]  Physical Therapy Treatment  Patient Details  Name: Steve Henry MRN: [id number] Date of Birth: 01/22/1981 Referring Provider (PT): Salvatore Marvel   Encounter Date: 04/03/2018  PT End of Session - 04/03/18 1744    Visit Number  2    Number of Visits  4    Authorization Type  Medicaid    PT Start Time  1603    PT Stop Time  1650    PT Time Calculation (min)  47 min    Activity Tolerance  Patient limited by pain;Treatment limited secondary to medical complications (Comment)    Behavior During Therapy  [facility] for tasks assessed/performed       Past Medical History:  Diagnosis Date  . Chronic back pain   . Chronic hand pain   . DDD (degenerative disc disease), cervical   . De Quervain's tenosynovitis   . Drug-seeking behavior   . Narcotic dependence ([place])     Past Surgical History:  Procedure Laterality Date  . MASS EXCISION N/A 03/13/2018   Procedure: EXCISION LIPOMA SCALP;  Surgeon: Aviva Signs, MD;  Location: [facility];  Service: General;  Laterality: N/A;  pt knows to arrive at 6:15  . SPINAL FUSION      There were no vitals filed for this visit.  Subjective Assessment - 04/03/18 1604    Subjective  Patient reports that he went to the pain clinic this week.  He reports that they reported that they felt like he waited "too long to have the surgery".  He reports that he is always in pain and has difficulty moving    Currently in Pain?  Yes    Pain Score  8     Pain Location  Neck    Aggravating Factors   activity                       [facility] Adult PT Treatment/Exercise - 04/03/18 0001      Exercises   Exercises  Lumbar      Lumbar Exercises: Stretches   Gastroc Stretch  Right;Left;4 reps;20 seconds    Gastroc Stretch Limitations  seated    Other Lumbar Stretch Exercise  AAROM of the shoulder  in sitting      Lumbar Exercises: Aerobic   Nustep  level 1 x 6 minutes, really good not keep the machine on due to how slow he was going      Lumbar Exercises: Seated   Long Arc Quad on Chair  Both;2 sets;10 reps    LAQ on Chair Limitations  with yellow tband assist    Other Seated Lumbar Exercises  pelvic clocks, foot on the sit fit ankle motions    Other Seated Lumbar Exercises  yellow tband knee flexion and shoulder retraction      Modalities   Modalities  Electrical Stimulation;Moist Heat      Moist Heat Therapy   Number Minutes Moist Heat  15 Minutes    Moist Heat Location  Lumbar Spine;Cervical      Electrical Stimulation   Electrical Stimulation Location  lumbar and cervical     Electrical Stimulation Action  premod    Electrical Stimulation Parameters  sitting    Electrical Stimulation Goals  Pain  PT Long Term Goals - 03/20/18 1734      PT LONG TERM GOAL #1   Title  Patient independent with HEP    Time  12    Period  Weeks    Status  New      PT LONG TERM GOAL #2   Title  patient will decrease TUG time to 40 seconds    Time  12    Period  Weeks    Status  New      PT LONG TERM GOAL #3   Title  be able to transfer without any help    Time  12    Period  Weeks    Status  New      PT LONG TERM GOAL #4   Title  increase grip strength to 10#    Time  12    Period  Weeks    Status  New      PT LONG TERM GOAL #5   Title  tolerate walking 300 feet with SPC or walker    Time  12    Period  Weeks    Status  New            Plan - 04/03/18 1745    Clinical Impression Statement  Patient is very slow with a lot of ataxic motions and movements, needs ample time, demonstration and assist to do any exercises.  When walking in the clinic he needs close CGA due to poor gait and instability    PT Next Visit Plan  Try to get him moving without causing pain, see if modalities helped    Consulted and Agree with Plan of Care  Patient        Patient will benefit from skilled therapeutic intervention in order to improve the following deficits and impairments:  Decreased coordination, Abnormal gait, Decreased range of motion, Difficulty walking, Impaired UE functional use, Increased muscle spasms, Decreased activity tolerance, Pain, Decreased balance, Decreased mobility, Decreased strength, Postural dysfunction  Visit Diagnosis: Muscle weakness (generalized)  Difficulty in walking, not elsewhere classified  Cervicalgia     Problem List Patient Active Problem List   Diagnosis Date Noted  . Lipoma   . Numbness 09/21/2015    Sumner Boast., PT 04/03/2018, 5:46 PM  [place] [address] [address] [place], [place], [address] Phone: [phone]   Fax:  [phone]  Name: Steve Henry MRN: [id number] Date of Birth: [birth year]

## 2018-04-17 ENCOUNTER — Ambulatory Visit: Payer: Medicaid Other | Admitting: Physical Therapy

## 2018-04-24 ENCOUNTER — Ambulatory Visit: Payer: Medicaid Other | Admitting: Physical Therapy

## 2018-05-01 ENCOUNTER — Ambulatory Visit: Payer: Medicaid Other | Admitting: Physical Therapy

## 2018-05-13 ENCOUNTER — Ambulatory Visit: Payer: Medicaid Other | Admitting: Physical Therapy

## 2018-05-15 ENCOUNTER — Ambulatory Visit: Payer: Medicaid Other | Attending: Family Medicine | Admitting: Physical Therapy

## 2018-05-15 DIAGNOSIS — R262 Difficulty in walking, not elsewhere classified: Secondary | ICD-10-CM | POA: Insufficient documentation

## 2018-05-15 DIAGNOSIS — M542 Cervicalgia: Secondary | ICD-10-CM | POA: Insufficient documentation

## 2018-05-15 DIAGNOSIS — M6281 Muscle weakness (generalized): Secondary | ICD-10-CM | POA: Insufficient documentation

## 2018-05-22 ENCOUNTER — Ambulatory Visit: Payer: Medicaid Other | Admitting: Physical Therapy

## 2018-05-27 ENCOUNTER — Ambulatory Visit: Payer: Medicaid Other | Admitting: Physical Therapy

## 2018-06-04 ENCOUNTER — Ambulatory Visit: Payer: Medicaid Other | Admitting: Physical Therapy

## 2018-06-04 DIAGNOSIS — R262 Difficulty in walking, not elsewhere classified: Secondary | ICD-10-CM | POA: Diagnosis present

## 2018-06-04 DIAGNOSIS — M542 Cervicalgia: Secondary | ICD-10-CM

## 2018-06-04 DIAGNOSIS — M6281 Muscle weakness (generalized): Secondary | ICD-10-CM | POA: Diagnosis present

## 2018-06-04 NOTE — Therapy (Signed)
Steve [facility] [address] [place], [place], [address] Phone: [phone]   Fax:  [phone]  Physical Therapy Treatment  Patient Details  Name: Steve Henry MRN: [id number] Date of Birth: [birth year] Referring Provider (PT): Salvatore Marvel   Encounter Date: 06/04/2018  PT End of Session - 06/04/18 1402    Visit Number  3    Number of Visits  4    Authorization Type  Medicaid    PT Start Time  1300    PT Stop Time  1350    PT Time Calculation (min)  50 min       Past Medical History:  Diagnosis Date  . Chronic back pain   . Chronic hand pain   . DDD (degenerative disc disease), cervical   . De Quervain's tenosynovitis   . Drug-seeking behavior   . Narcotic dependence ([place])     Past Surgical History:  Procedure Laterality Date  . MASS EXCISION N/A 03/13/2018   Procedure: EXCISION LIPOMA SCALP;  Surgeon: Aviva Signs, MD;  Location: [facility];  Service: General;  Laterality: N/A;  pt knows to arrive at 6:15  . SPINAL FUSION      There were no vitals filed for this visit.  Subjective Assessment - 06/04/18 1359    Subjective  amb in with antalgic gait. pt very fighting depression is why he has missed so many appts but doing better now    Currently in Pain?  Yes    Pain Score  4     Pain Location  Neck                       [facility] Adult PT Treatment/Exercise - 06/04/18 0001      Lumbar Exercises: Aerobic   Nustep  L 1 3 min and then 2 min at end of session      Lumbar Exercises: Machines for Strengthening   Other Lumbar Machine Exercise  yellow tband HS curl Left 2 sets 5, RT no resisitance    Other Lumbar Machine Exercise  LAQ 2 sets 5 BIL      Lumbar Exercises: Standing   Other Standing Lumbar Exercises  STS 3 sets 5 with assistance to wt shift onto RT    Other Standing Lumbar Exercises  standing wt shift,marchin and hip flex - assistance needed                  PT Long  Term Goals - 03/20/18 1734      PT LONG TERM GOAL #1   Title  Patient independent with HEP    Time  12    Period  Weeks    Status  New      PT LONG TERM GOAL #2   Title  patient will decrease TUG time to 40 seconds    Time  12    Period  Weeks    Status  New      PT LONG TERM GOAL #3   Title  be able to transfer without any help    Time  12    Period  Weeks    Status  New      PT LONG TERM GOAL #4   Title  increase grip strength to 10#    Time  12    Period  Weeks    Status  New      PT LONG TERM GOAL #5  Title  tolerate walking 300 feet with SPC or walker    Time  12    Period  Weeks    Status  New            Plan - 06/04/18 1402    Clinical Impression Statement  no goals met. pt fatigue and pain limited thru session. decreased ability to move either leg esp HS an dhip ext. manual cuing to increase wt on RT LE- very fearful of falling. pt needed w/c to get out of clinic. pt very upset about weakness and asked many questions about rehab potential.    PT Treatment/Interventions  ADLs/Self Care Home Management;Cryotherapy;Electrical Stimulation;Moist Heat;Ultrasound;Gait training;Neuromuscular re-education;Balance training;Therapeutic exercise;Therapeutic activities;Functional mobility training;Stair training;Patient/family education;Manual techniques    PT Next Visit Plan  Mediciad renewal then refer to [facility] for water therapy  pt agrees       Patient will benefit from skilled therapeutic intervention in order to improve the following deficits and impairments:  Decreased coordination, Abnormal gait, Decreased range of motion, Difficulty walking, Impaired UE functional use, Increased muscle spasms, Decreased activity tolerance, Pain, Decreased balance, Decreased mobility, Decreased strength, Postural dysfunction  Visit Diagnosis: Muscle weakness (generalized)  Difficulty in walking, not elsewhere classified  Cervicalgia     Problem List Patient Active  Problem List   Diagnosis Date Noted  . Lipoma   . Numbness 09/21/2015    ,ANGIE PTA 06/04/2018, 2:04 PM  [place] [address] [place], [place], [address] Phone: [phone]   Fax:  [phone]  Name: Steve Henry MRN: [id number] Date of Birth: 1980/06/13

## 2018-06-17 ENCOUNTER — Telehealth (HOSPITAL_COMMUNITY): Payer: Self-pay

## 2018-06-17 ENCOUNTER — Ambulatory Visit (HOSPITAL_COMMUNITY): Payer: Medicaid Other | Attending: Family Medicine

## 2018-06-17 ENCOUNTER — Encounter (HOSPITAL_COMMUNITY): Payer: Self-pay

## 2018-06-17 DIAGNOSIS — M5030 Other cervical disc degeneration, unspecified cervical region: Secondary | ICD-10-CM | POA: Insufficient documentation

## 2018-06-17 DIAGNOSIS — M4320 Fusion of spine, site unspecified: Secondary | ICD-10-CM | POA: Insufficient documentation

## 2018-06-17 DIAGNOSIS — M6281 Muscle weakness (generalized): Secondary | ICD-10-CM | POA: Insufficient documentation

## 2018-06-17 DIAGNOSIS — R262 Difficulty in walking, not elsewhere classified: Secondary | ICD-10-CM | POA: Insufficient documentation

## 2018-06-17 NOTE — Telephone Encounter (Signed)
Pt called at 12:22 about missing his apptment he suffers from Depression and sometime can not come . He wanted to r/s for Thurs @ 10:30--Done. NF 16/2020 Scanned in chart 06/10/2018 Medicaid approved 3 visit 1/2-07/13/2018. Eval was done at [facility]. Medicaid approval request was to state 06/13/2018. I checked for approval and it was not approved yet. NF 06/07/2018

## 2018-06-20 ENCOUNTER — Encounter (HOSPITAL_COMMUNITY): Payer: Self-pay

## 2018-06-20 ENCOUNTER — Ambulatory Visit (HOSPITAL_COMMUNITY): Payer: Medicaid Other

## 2018-06-20 ENCOUNTER — Other Ambulatory Visit: Payer: Self-pay

## 2018-06-20 DIAGNOSIS — M4320 Fusion of spine, site unspecified: Secondary | ICD-10-CM | POA: Diagnosis not present

## 2018-06-20 DIAGNOSIS — M542 Cervicalgia: Secondary | ICD-10-CM

## 2018-06-20 DIAGNOSIS — M5030 Other cervical disc degeneration, unspecified cervical region: Secondary | ICD-10-CM | POA: Diagnosis not present

## 2018-06-20 DIAGNOSIS — M6281 Muscle weakness (generalized): Secondary | ICD-10-CM

## 2018-06-20 DIAGNOSIS — R262 Difficulty in walking, not elsewhere classified: Secondary | ICD-10-CM

## 2018-06-20 NOTE — Therapy (Signed)
[place], [place], [address] Phone: [phone]   Fax:  [phone]  Physical Therapy Treatment  Patient Details  Name: Steve Henry MRN: [id number] Date of Birth: 1981/01/02 Referring Provider (PT): Salvatore Marvel, [place]   Encounter Date: 06/20/2018  PT End of Session - 06/20/18 1615    Visit Number  4    Number of Visits  12    Date for PT Re-Evaluation  08/15/18   reassess dur 07/18/18   Authorization Type  Medicaid (3 units for 06/13/18 - 07/13/18)    Authorization Time Period  03/20/18 - 53/6/64; new cert: 4/0/34 - 12/14/23    Authorization - Visit Number  1    Authorization - Number of Visits  3    PT Start Time  9563    PT Stop Time  8756    PT Time Calculation (min)  49 min    Activity Tolerance  Patient limited by pain;Patient limited by fatigue    Behavior During Therapy  [facility] for tasks assessed/performed       Past Medical History:  Diagnosis Date  . Chronic back pain   . Chronic hand pain   . DDD (degenerative disc disease), cervical   . De Quervain's tenosynovitis   . Drug-seeking behavior   . Narcotic dependence ([place])     Past Surgical History:  Procedure Laterality Date  . MASS EXCISION N/A 03/13/2018   Procedure: EXCISION LIPOMA SCALP;  Surgeon: Aviva Signs, MD;  Location: [facility];  Service: General;  Laterality: N/A;  pt knows to arrive at 6:15  . SPINAL FUSION      There were no vitals filed for this visit.  Subjective Assessment - 06/20/18 1044    Subjective  Steve Henry arrives to appointment ambulating with [facility]. Patient reports he is not in severe pain at start of session but that he typically always has pain. He states it is all over his back. He states prolonged positions or walking too much causes him pain and weakness, "if I move too much I have to park it". He states it is difficult to come up with goals because his mind still tells him he can move like he did before all of his  mobility impairments began, however his body doesn't follow through. He reports he would like to be able to walk like he used to. Upon discussion of achievable goals he agreed to fall less and be able to play with his kids more.     Limitations  Standing;Walking;House hold activities;Lifting    Patient Stated Goals  to be able to walk better and hurt less    Currently in Pain?  No/denies         [facility] PT Assessment - 06/20/18 0001      Assessment   Medical Diagnosis  cervical myelopathy, weakness    Referring Provider (PT)  Independence, PA-C    Hand Dominance  Right    Prior Therapy  C4-7 ACDF in 2017      Precautions   Precautions  None      Balance Screen   Has the patient fallen in the past 6 months  Yes    How many times?  --   "too many to count"   Has the patient had a decrease in activity level because of a fear of falling?   Yes    Is the patient reluctant to leave their home because of a  fear of falling?   Yes      Home Environment   Additional Comments  patient lives with his 24 and 40 year old sona nd daughter and the mother of his children. He has a SPC, and power wheelchair. He is unable to use his power wheelchair optimally at this time as he cannot get it out of his house. He uses it ion the home.       Prior Function   Level of Independence  Needs assistance with ADLs;Needs assistance with homemaking;Needs assistance with transfers;Needs assistance with gait    Vocation  Unemployed      Cognition   Overall Cognitive Status  Within Functional Limits for tasks assessed   pt has significant history of depression     Strength   Overall Strength  Deficits    Overall Strength Comments  generalizzed weakness in bil LE's and UE's.    03/20/18 = hips 3-/5, knees 3-/5, ankles 2/5, elbows 2+/5, sh     Transfers   Comments  Patient is modified independnet for sit to stand transfer at start of session however required min assist throughout remainder of session as fatigue  increased. He has ataxic movements with UE's and LE's.       Ambulation/Gait   Ambulation/Gait  Yes    Ambulation/Gait Assistance  4: Min guard    Ambulation Distance (Feet)  30 Feet    Assistive device  Straight cane    Gait Pattern  Step-to pattern;Decreased arm swing - left;Decreased step length - right;Decreased stance time - left;Decreased stride length;Decreased hip/knee flexion - right;Decreased hip/knee flexion - left;Decreased dorsiflexion - left;Decreased dorsiflexion - right;Decreased weight shift to left;Ataxic;Trunk flexed;Poor foot clearance - left    Ambulation Surface  Level;Indoor    Gait velocity  slow and indicative of fall risk an dlimited household ambulator    Gait Comments  Patient ambulates with SPC in Rt hand. He requiresclose contact guard thorughout due to Lt LE buckling and required seated rest break follow short bout of ambulation. While walking out of the clinic he requires Mod A to maintain balance duel to Lt knee buckling and was then able to ambulate to waiting room for seated rest break.       Standardized Balance Assessment   Standardized Balance Assessment  Timed Up and Go Test      Timed Up and Go Test   Normal TUG (seconds)  --   assessed on 03/20/18 = 85 seconds      [facility] Adult PT Treatment/Exercise - 06/20/18 0001      Exercises   Exercises  Knee/Hip;Hand      Knee/Hip Exercises: Seated   Other Seated Knee/Hip Exercises  1x 5 reps bil LE's for heel/toe raises    Marching  1 set;Both;5 reps;Strengthening      Hand Exercises for Cervical Radiculopathy   Gross Grasp  yellow theraputty: 1 minute Bil UE    Pinch Grip  yellow theraputty: 1 minute Bil UE   key pinch      PT Education - 06/20/18 1613    Education Details  Educated on aquatic therapy benefits. Educated on visits approval and on plan to request additional after next 2 are completed. Educated on initial HEP for grip strengthening and for LE strengthening while sitting.    Person(s)  Educated  Patient    Methods  Explanation    Comprehension  Verbalized understanding       PT Short Term Goals - 06/20/18 1625  PT SHORT TERM GOAL #1   Title  Patient will be independent with HEP, updated PRN, to strengthen UE's and LE's to improve function with gait/mobilty.    Time  4    Period  Weeks    Status  New    Target Date  07/18/18      PT SHORT TERM GOAL #2   Title  Patient will reduce TUG time by 20 seconds or more to demonstrate significant improvement in gait speed and balance during turns. (performed with SPC or RW)    Time  4    Period  Weeks    Status  New        PT Long Term Goals - 06/20/18 1627      PT LONG TERM GOAL #1   Title  Patient will be able to perform 5x sit to stand transfer with modified independence use UE's for support to improve independence with transfers.     Time  8    Period  Weeks    Status  New    Target Date  08/15/18      PT LONG TERM GOAL #2   Title  Patient will ambulate 200' or greater with RW or SPC and no reports of increased LBP and no LT LE buckling ot improve household mobility and reduce fall risk.    Time  8    Period  Weeks    Status  New      PT LONG TERM GOAL #3   Title  Patient will peform floor to stand transfer with modified independence to improve abiltiy to play/interact with 88 & 28 year old children on the floor.    Period  Weeks    Status  New      PT LONG TERM GOAL #4   Title  increase grip strength to 10#    Time  8    Period  Weeks    Status  New        Plan - 06/20/18 1622    Clinical Impression Statement  Steve Henry arrives for physical therapy session after evaluation at [place] [facility] in [facility]. He feels he has made little progress or worsened in mobility since his C4-7 ACDF surgery ~ 2years ago. He reports his mobility has been limited for ~ 3 years and he has back pain and pain over his body constantly. He states it is not severe today but at time it limits him  substantially. Steve Henry was able to ambulate ~60 feet with SPC into the clinic with contact guard assist for safety due to ataxic gait and Lt LE buckling. During session he performed ~ 30 feet of gait training and SPC and required a seated rest break following this. Attempted to adjust pt's gait pattern as he keep the [facility] with hi Rt LE rather than the Lt, however he had increased ataxia with altered sequencing. The remainder of therapy focused on education regarding aquatic therapy which he will begin next week. He was also instructed in initial HEP for grip strengthening and LE strengthening. He will continue to benefit from skilled PT interventions to address impairments and improve independence with functional mobility to improve QOL.    PT Frequency  1x / week   1-2x/week (primarily 1x/week for aquatics, intermittently 2nd visit for testing/re-assessments)   PT Duration  8 weeks    PT Treatment/Interventions  ADLs/Self Care Home Management;Cryotherapy;Electrical Stimulation;Moist Heat;Ultrasound;Gait training;Neuromuscular re-education;Balance training;Therapeutic exercise;Therapeutic activities;Functional mobility training;Stair training;Patient/family  education;Manual techniques;Aquatic Therapy;DME Instruction    PT Next Visit Plan  Initiate aquatic therpay and focus on gait and balance training. Add funcitonal LE strengthening.     PT Home Exercise Plan  06/20/18 - theraputty grasp and key grip with yellow putty, seated marching, seated heel/toe raises    Consulted and Agree with Plan of Care  Patient       Patient will benefit from skilled therapeutic intervention in order to improve the following deficits and impairments:  Decreased coordination, Abnormal gait, Decreased range of motion, Difficulty walking, Impaired UE functional use, Increased muscle spasms, Decreased activity tolerance, Pain, Decreased balance, Decreased mobility, Decreased strength, Postural dysfunction  Visit  Diagnosis: Muscle weakness (generalized)  Difficulty in walking, not elsewhere classified  Cervicalgia     Problem List Patient Active Problem List   Diagnosis Date Noted  . Lipoma   . Numbness 09/21/2015    Kipp Brood, PT, DPT Physical Therapist with [facility]  06/20/2018 5:47 PM    [place] [address] [place], [place], [address] Phone: [phone]   Fax:  [phone]  Name: Steve Henry MRN: [id number] Date of Birth: 1980/08/10

## 2018-06-20 NOTE — Patient Instructions (Signed)
Key Pinch with Putty reps: 3 sets: 2 hold: 1 minute daily: 1  weekly: 7      Exercise image step 1   Exercise image step 2  Setup  Begin sitting upright with a ball of putty between your thumb and the side of your index finger. Movement  Press the putty down into the side of your index finger, keeping the tip of your thumb straight. Reshape the putty, and repeat. Tip  Make sure to keep your wrist and the tip of your thumb straight. Putty Squeezes reps: 3 sets: 2 hold: 1 minute daily: 1  weekly: 7      Exercise image step 1   Exercise image step 2   Exercise image step 3  Setup  Begin sitting with your hand in a comfortable position, holding putty in your palm. Movement  Gently squeeze the putty using all of your fingers equally, and repeat. Tip  Make sure to keep the rest of your arm relaxed during the movement. Disclaimer: This program provides exercises related to your condition that you can perform at home. As there is a risk of injury with any activity, use caution when performing exercises. If you experience any pain or discomfort, discontinue the exercises and contact your health care provider.  Login URL: [email or web address] Access Code: [email or web address] . Date printed: 06/20/2018 Page 1  Seated March reps: 10 sets: 2 hold: 3 seconds daily: 1  weekly: 7      Exercise image step 1   Exercise image step 2   Exercise image step 3  Setup  Begin sitting upright in a chair with your feet flat on the floor. Movement  Keeping your knee bent, lift one leg then lower it back to the ground and repeat with your other leg. Continue this movement, alternating between each leg.  Tip  Make sure to keep your back straight and do not let it arch as you lift your legs. Seated Heel Toe Raises  reps: 10 sets: 2 hold: 3 seconds daily: 1  weekly: 7      Exercise image step 1   Exercise image step 2  Setup  Begin sitting upright with your feet shoulder width apart. Movement    Slowly raise your heels off the floor and lower them back down, then raise your toes off the floor and lower them back down. Repeat.  Tip  Make sure to keep the balls of your feet on the floor when you raise your heels, and keep your heels on the floor when you raise your toes.

## 2018-06-24 ENCOUNTER — Ambulatory Visit (HOSPITAL_COMMUNITY): Payer: Medicaid Other

## 2018-06-24 ENCOUNTER — Telehealth (HOSPITAL_COMMUNITY): Payer: Self-pay | Admitting: General Practice

## 2018-06-24 NOTE — Telephone Encounter (Signed)
06/24/18  pt left a message to cx said that he was a little bit under the weather

## 2018-07-15 ENCOUNTER — Telehealth (HOSPITAL_COMMUNITY): Payer: Self-pay

## 2018-07-15 ENCOUNTER — Ambulatory Visit (HOSPITAL_COMMUNITY): Payer: Medicaid Other | Attending: Family Medicine

## 2018-07-15 DIAGNOSIS — M542 Cervicalgia: Secondary | ICD-10-CM | POA: Insufficient documentation

## 2018-07-15 DIAGNOSIS — M6281 Muscle weakness (generalized): Secondary | ICD-10-CM | POA: Insufficient documentation

## 2018-07-15 DIAGNOSIS — R262 Difficulty in walking, not elsewhere classified: Secondary | ICD-10-CM | POA: Insufficient documentation

## 2018-07-15 NOTE — Telephone Encounter (Signed)
Pt just called and [facility]. I explained to him that I did not know of any changes as long as the patient notifies them in time to be picked up. I ask the pt if he was going to other MD on the same date and he said no, please call him we you get back to the office.NF 07/15/2018

## 2018-07-15 NOTE — Telephone Encounter (Signed)
Steve Henry called our office to discuss his difficulty with transportation. I returned his call and discussed with him that we have sent a letter of justification for transportation to [facility] for prior patients and should be able to do so for him as well. I informed him I will send the letter today. I also informed him his Medicaid authorization has run out and that I will request more visits for him as well. He stated he understood and did not have any other questions. I told him I will call him again this week to let him know what I learn from [facility] and if his medicaid is approved.   Kipp Brood, PT, DPT, [facility] Physical Therapist with [facility]  07/15/2018 3:53 PM

## 2018-07-17 ENCOUNTER — Encounter (HOSPITAL_COMMUNITY): Payer: Self-pay

## 2018-07-17 NOTE — Progress Notes (Signed)
July 17, 2018   Steve Henry     Patient: Steve Henry  MRN: [id number]  Date of Birth: 1981/04/14      Dear Ms.Steve Henry:   Thank you for providing transportation for Mr. Steve Henry to participate in physical therapy services. He has been evaluated for back pain and weakness following ACDF due to cervical myelopathy. He has presented with decreased strength, postural dysfunction, poor activity tolerance, pain, history of falling, and low tolerance to weight bearing activities including walking and standing. He is currently unable to ambulate without assistance and cannot walk for greater than 1-2 minutes in land based setting with RW.   The aquatic environment offers a safe setting for Mr. Steve Henry to participate in physical therapy interventions for balance, strengthening, and gait training. The effects of water on the body include buoyancy, water density, water viscosity, hydrostatic pressure, and temperature. Buoyancy reduces the patient's bodyweight and unloads the joints of her lumbar spine to improve tolerance to weight bearing exercises she otherwise would not be able to perform. Density and viscosity provide resistance during exercises to increase muscle strength to address muscle imbalances and weakness to improve postural deficits. Temperature provides a cooler environment to promote muscle activation and facilitate muscle strengthening, and the hydrostatic pressure provides improved awareness of the brain-body relationship resulting in improved posture and balance.    For these above-mentioned reasons, Steve Henry will benefit from aquatic therapy. This will require transportation to the [facility] at the below listed address for his appointments as well as service to pick him up after completion of his appointments. All physical therapy appointments at the [facility] are 45 minutes long. Patient's should be dropped off and picked up at the main entrance.      Sincerely,          Kipp Brood, PT, DPT, [facility] Physical Therapist with [facility]  07/17/2018 5:17 PM

## 2018-07-22 ENCOUNTER — Ambulatory Visit (HOSPITAL_COMMUNITY): Payer: Medicaid Other

## 2018-07-24 ENCOUNTER — Telehealth (HOSPITAL_COMMUNITY): Payer: Self-pay

## 2018-07-24 NOTE — Telephone Encounter (Signed)
Talked with Steve Henry at [facility] she will put this pt on the list for the first 4 visits and try to get the rest approved when her Manager get back in the office. Steve Henry. states she can not get in touch with the patient. We compared numbers and she had the same number that we have. NF 07/24/2018

## 2018-07-25 ENCOUNTER — Telehealth (HOSPITAL_COMMUNITY): Payer: Self-pay

## 2018-07-25 NOTE — Telephone Encounter (Signed)
I called Mr. Touhey at his home number and left a voice message to inform him his medicaid visits have been approved and he can begin aquatic therapy this Monday 07/29/2018 and that his appointment is for 2:30 PM. I also informed him that [facility] has approved his transportation to the [facility] for the therapy sessions. I asked him to call our front office back to go over a couple of specific plans for setting up his therapy session. I provided our office number [phone].  Kipp Brood, PT, DPT, [facility] Physical Therapist with [facility]  07/25/2018 1:19 PM

## 2018-07-29 ENCOUNTER — Ambulatory Visit (HOSPITAL_COMMUNITY): Payer: Medicaid Other

## 2018-07-29 ENCOUNTER — Telehealth (HOSPITAL_COMMUNITY): Payer: Self-pay

## 2018-07-29 ENCOUNTER — Encounter (HOSPITAL_COMMUNITY): Payer: Self-pay

## 2018-07-29 NOTE — Telephone Encounter (Signed)
I called Mr. Steve Henry to discuss his aquatic therapy appointment for today. I informed him we would have a wheelchair at the front entrance for him to be able to make his way back to the pool more easily. He stated he would be at the appointment at 2:30.  Kipp Brood, PT, DPT, [facility] Physical Therapist with [facility]  07/29/2018 1:10 PM

## 2018-07-31 ENCOUNTER — Telehealth (HOSPITAL_COMMUNITY): Payer: Self-pay

## 2018-07-31 NOTE — Telephone Encounter (Signed)
I called Mr. Steve Henry regarding his no show on Monday 07/29/18 for aquatic therapy. I informed him I have spoken with Bethany at [facility] who he is receiving transportation services with and she has a different address than what we have on file. For him to receive transportation services we need to make sure they have the correct address and connect him with [name] to confirm appointment pick ups. I asked that he call me back at our office number [phone] to coordinate his transportation to participate in therapy.   Kipp Brood, PT, DPT, [facility] Physical Therapist with [facility]  07/31/2018 9:41 AM

## 2018-08-05 ENCOUNTER — Ambulatory Visit (HOSPITAL_COMMUNITY): Payer: Medicaid Other

## 2018-08-13 ENCOUNTER — Ambulatory Visit (HOSPITAL_COMMUNITY): Payer: Medicaid Other | Attending: *Deleted

## 2018-08-15 ENCOUNTER — Telehealth (HOSPITAL_COMMUNITY): Payer: Self-pay

## 2018-08-15 NOTE — Telephone Encounter (Signed)
I called Mr. Si at the listed phone number regarding his repeated missed/no-show physical therapy appointments. He did not answer and I left a voice message informing him he will be discharged from physical therapy due to his poor attendance. I informed him he will need to obtain a new referral form his physician if he wishes to continue with physical therapy and have it sent to our office to schedule a new evaluation.   Kipp Brood, PT, DPT, [facility] Physical Therapist with [facility]  08/15/2018 11:33 AM

## 2018-08-20 ENCOUNTER — Ambulatory Visit (HOSPITAL_COMMUNITY): Payer: Medicaid Other

## 2018-08-27 ENCOUNTER — Ambulatory Visit (HOSPITAL_COMMUNITY): Payer: Medicaid Other

## 2018-09-03 ENCOUNTER — Ambulatory Visit (HOSPITAL_COMMUNITY): Payer: Medicaid Other

## 2018-09-10 ENCOUNTER — Encounter (HOSPITAL_COMMUNITY): Payer: Self-pay

## 2018-09-10 ENCOUNTER — Ambulatory Visit (HOSPITAL_COMMUNITY): Payer: Medicaid Other

## 2018-09-10 NOTE — Therapy (Signed)
[place], [place], [address] Phone: [phone]   Fax:  [phone]  Patient Details  Name: Steve Henry MRN: [id number] Date of Birth: 01-13-1981 Referring Provider:  No ref. provider found  Encounter Date: 09/10/2018   PHYSICAL THERAPY DISCHARGE SUMMARY  Visits from Start of Care: 4  Current functional level related to goals / functional outcomes: Steve Henry participated 4 sessions of physical therapy beginning with an evaluation on 03/20/2018 and ending on 06/20/2018. He had poor attendance throughout and was provided multiple phone calls and reminders for importance of attendance to participate and benefit from therapy. He was provided the option to initiate aquatic therapy which he appeared interested in but no-showed for all appointments. He was provided multiple resources such as a letter medical necessity for transportation services to the pool to increase his likelihood of being able to improve attendance and still did not arrive for his appointments. He has not reached out to reschedule appointments after voice messages had been left on his home phone number to do so. A voice message was left on 08/15/18 informing him that he will be discharged for poor attendance and that he will need a new referral to return to physical therapy.    Remaining deficits: See last treatment notes   Education / Equipment: Educated on exercises and benefits of physical therapy in land based and aquatic based setting. He was educated to obtain a new referral if he wishes to return to physical therapy.   Plan: Patient agrees to discharge.  Patient goals were not met. Patient is being discharged due to not returning since the last visit.  ?????     Kipp Brood, PT, DPT, [facility] Physical Therapist with [facility]  09/10/2018 8:11 AM    [place], [place], [address] Phone: [phone]   Fax:  [phone]

## 2019-11-29 ENCOUNTER — Emergency Department (HOSPITAL_COMMUNITY)
Admission: EM | Admit: 2019-11-29 | Discharge: 2019-11-29 | Disposition: A | Discharge status: Home / Self Care | Payer: Medicaid Other | Source: Home / Self Care | Attending: Emergency Medicine | Admitting: Emergency Medicine

## 2019-11-29 ENCOUNTER — Other Ambulatory Visit: Payer: Self-pay

## 2019-11-29 ENCOUNTER — Encounter (HOSPITAL_COMMUNITY): Payer: Self-pay | Admitting: Emergency Medicine

## 2019-11-29 ENCOUNTER — Emergency Department (HOSPITAL_COMMUNITY)
Admission: EM | Admit: 2019-11-29 | Discharge: 2019-11-29 | Disposition: A | Discharge status: Home / Self Care | Payer: Medicaid Other | Attending: Emergency Medicine | Admitting: Emergency Medicine

## 2019-11-29 DIAGNOSIS — F1193 Opioid use, unspecified with withdrawal: Secondary | ICD-10-CM

## 2019-11-29 DIAGNOSIS — F17210 Nicotine dependence, cigarettes, uncomplicated: Secondary | ICD-10-CM | POA: Insufficient documentation

## 2019-11-29 DIAGNOSIS — R030 Elevated blood-pressure reading, without diagnosis of hypertension: Secondary | ICD-10-CM | POA: Diagnosis not present

## 2019-11-29 DIAGNOSIS — R109 Unspecified abdominal pain: Secondary | ICD-10-CM | POA: Diagnosis present

## 2019-11-29 DIAGNOSIS — F1123 Opioid dependence with withdrawal: Secondary | ICD-10-CM | POA: Diagnosis not present

## 2019-11-29 LAB — CBC WITH DIFFERENTIAL/PLATELET
Abs Immature Granulocytes: 0 10*3/uL (ref 0.00–0.07)
Basophils Absolute: 0 10*3/uL (ref 0.0–0.1)
Basophils Relative: 1 %
Eosinophils Absolute: 0.2 10*3/uL (ref 0.0–0.5)
Eosinophils Relative: 4 %
HCT: 38.5 % — ABNORMAL LOW (ref 39.0–52.0)
Hemoglobin: 12.7 g/dL — ABNORMAL LOW (ref 13.0–17.0)
Immature Granulocytes: 0 %
Lymphocytes Relative: 31 %
Lymphs Abs: 1.8 10*3/uL (ref 0.7–4.0)
MCH: 27.9 pg (ref 26.0–34.0)
MCHC: 33 g/dL (ref 30.0–36.0)
MCV: 84.4 fL (ref 80.0–100.0)
Monocytes Absolute: 0.4 10*3/uL (ref 0.1–1.0)
Monocytes Relative: 6 %
Neutro Abs: 3.4 10*3/uL (ref 1.7–7.7)
Neutrophils Relative %: 58 %
Platelets: 312 10*3/uL (ref 150–400)
RBC: 4.56 MIL/uL (ref 4.22–5.81)
RDW: 12.4 % (ref 11.5–15.5)
WBC: 5.8 10*3/uL (ref 4.0–10.5)
nRBC: 0 % (ref 0.0–0.2)

## 2019-11-29 LAB — BASIC METABOLIC PANEL
Anion gap: 9 (ref 5–15)
BUN: 11 mg/dL (ref 6–20)
CO2: 25 mmol/L (ref 22–32)
Calcium: 9 mg/dL (ref 8.9–10.3)
Chloride: 102 mmol/L (ref 98–111)
Creatinine, Ser: 1.15 mg/dL (ref 0.61–1.24)
GFR calc Af Amer: >60 mL/min (ref >60)
GFR calc non Af Amer: >60 mL/min (ref >60)
Glucose, Bld: 95 mg/dL (ref 70–99)
Potassium: 3.5 mmol/L (ref 3.5–5.1)
Sodium: 136 mmol/L (ref 135–145)

## 2019-11-29 LAB — ETHANOL: Alcohol, Ethyl (B): <10 mg/dL (ref <10)

## 2019-11-29 MED ORDER — KETOROLAC TROMETHAMINE 30 MG/ML IJ SOLN
30.0000 mg | Freq: Once | INTRAMUSCULAR | Status: AC
  Administered 2019-11-29: 30 mg via INTRAVENOUS
  Filled 2019-11-29: qty 1

## 2019-11-29 MED ORDER — ONDANSETRON HCL 4 MG PO TABS: 4.0000 mg | ORAL_TABLET | Freq: Four times a day (QID) | ORAL | 0 refills | Status: DC | PRN

## 2019-11-29 MED ORDER — IBUPROFEN 800 MG PO TABS
800.0000 mg | ORAL_TABLET | Freq: Once | ORAL | Status: AC
  Administered 2019-11-29: 800 mg via ORAL
  Filled 2019-11-29: qty 1

## 2019-11-29 MED ORDER — ACETAMINOPHEN 500 MG PO TABS
1000.0000 mg | ORAL_TABLET | Freq: Once | ORAL | Status: AC
  Administered 2019-11-29: 1000 mg via ORAL
  Filled 2019-11-29: qty 2

## 2019-11-29 MED ORDER — CLONIDINE HCL 0.1 MG PO TABS: ORAL_TABLET | ORAL | 0 refills | Status: DC

## 2019-11-29 MED ORDER — DICYCLOMINE HCL 10 MG PO CAPS
20.0000 mg | ORAL_CAPSULE | Freq: Once | ORAL | Status: AC
  Administered 2019-11-29: 20 mg via ORAL
  Filled 2019-11-29: qty 2

## 2019-11-29 MED ORDER — CLONIDINE HCL 0.2 MG PO TABS
0.2000 mg | ORAL_TABLET | Freq: Once | ORAL | Status: AC
  Administered 2019-11-29: 0.2 mg via ORAL
  Filled 2019-11-29: qty 1

## 2019-11-29 MED ORDER — SODIUM CHLORIDE 0.9 % IV BOLUS
1000.0000 mL | Freq: Once | INTRAVENOUS | Status: AC
  Administered 2019-11-29: 1000 mL via INTRAVENOUS

## 2019-11-29 MED ORDER — HYDROXYZINE HCL 50 MG/ML IM SOLN
100.0000 mg | Freq: Once | INTRAMUSCULAR | Status: AC
  Administered 2019-11-29: 100 mg via INTRAMUSCULAR
  Filled 2019-11-29: qty 2

## 2019-11-29 MED ORDER — ONDANSETRON 8 MG PO TBDP
8.0000 mg | ORAL_TABLET | Freq: Once | ORAL | Status: AC
  Administered 2019-11-29: 8 mg via ORAL
  Filled 2019-11-29: qty 1

## 2019-11-29 MED ORDER — DIPHENHYDRAMINE HCL 25 MG PO CAPS
50.0000 mg | ORAL_CAPSULE | Freq: Once | ORAL | Status: AC
  Administered 2019-11-29: 50 mg via ORAL
  Filled 2019-11-29: qty 2

## 2019-11-29 MED ORDER — HYDROMORPHONE HCL 2 MG/ML IJ SOLN
2.0000 mg | Freq: Once | INTRAMUSCULAR | Status: AC
  Administered 2019-11-29: 2 mg via INTRAMUSCULAR
  Filled 2019-11-29: qty 1

## 2019-11-29 MED ORDER — DICYCLOMINE HCL 20 MG PO TABS: 20.0000 mg | ORAL_TABLET | Freq: Three times a day (TID) | ORAL | 0 refills | Status: DC

## 2019-11-29 NOTE — ED Provider Notes (Signed)
[facility] Provider Note   CSN: [id number] Arrival date & time: 11/29/19  0028   History Chief Complaint  Patient presents with  . detox/withdrawl    Steve Henry is a 39 y.o. male.  The history is provided by the patient.  He has history of chronic pain and is maintained on daily methadone.  He was switched from a clinic in [place] to a clinic in [place] but did not have transportation so he did not get his methadone dose this morning and we will not be able to get his methadone for several more days.  He is complaining of chills, abdominal cramping, nausea and feels that he is going through opioid withdrawal.  He states that at this point, he actually wants to go through a full detox program so that he can be off of opioid medication completely.  Past Medical History:  Diagnosis Date  . Chronic back pain   . Chronic hand pain   . DDD (degenerative disc disease), cervical   . De Quervain's tenosynovitis   . Drug-seeking behavior   . Narcotic dependence [facility])     Patient Active Problem List   Diagnosis Date Noted  . Lipoma   . Numbness 09/21/2015    Past Surgical History:  Procedure Laterality Date  . MASS EXCISION N/A 03/13/2018   Procedure: EXCISION LIPOMA SCALP;  Surgeon: Aviva Signs, MD;  Location: [facility];  Service: General;  Laterality: N/A;  pt knows to arrive at 6:15  . SPINAL FUSION         History reviewed. No pertinent family history.  Social History   Tobacco Use  . Smoking status: Current Every Day Smoker    Packs/day: 0.50    Years: 16.00    Pack years: 8.00    Types: Cigarettes  . Smokeless tobacco: Never Used  Vaping Use  . Vaping Use: Never used  Substance Use Topics  . Alcohol use: No  . Drug use: No    Home Medications Prior to Admission medications   Medication Sig Start Date End Date Taking? Authorizing Provider  cyclobenzaprine (FLEXERIL) 10 MG tablet Take 10 mg by mouth 3 (three) times daily as  needed for muscle spasms.     [provider]  FLUoxetine (PROZAC) 10 MG tablet Take 10 mg by mouth daily.    [provider]  HYDROcodone-acetaminophen (NORCO) 5-325 MG tablet Take 1 tablet by mouth every 4 (four) hours as needed for moderate pain. Patient not taking: Reported on 06/20/2018 03/26/18   Aviva Signs, MD  meloxicam (MOBIC) 15 MG tablet Take 15 mg by mouth daily as needed for pain.  12/12/17   [provider]  oxyCODONE (OXY IR/ROXICODONE) 5 MG immediate release tablet Take 5 mg by mouth every 4 (four) hours as needed for severe pain.    [provider]  pregabalin (LYRICA) 75 MG capsule Take 75 mg by mouth 2 (two) times daily. 01/30/18   [provider]  traZODone (DESYREL) 50 MG tablet Take 50 mg by mouth at bedtime.    [provider]    Allergies    Tramadol and Latex  Review of Systems   Review of Systems  All other systems reviewed and are negative.   Physical Exam Updated Vital Signs BP (!) 164/102 (BP Location: Right Arm)   Pulse (!) 103   Temp 98.6 F (37 C) (Oral)   Resp 18   Ht 5\' 2"  (1.575 m)   Wt 65.8 kg  SpO2 100%   BMI 26.52 kg/m   Physical Exam Vitals and nursing note reviewed.   39 year old male, anxious and uncomfortable, but in no acute distress. Vital signs are significant for elevated blood pressure and mildly elevated heart rate. Oxygen saturation is 100%, which is normal. Head is normocephalic and atraumatic. PERRLA, EOMI. Oropharynx is clear. Neck is nontender and supple without adenopathy or JVD. Back is nontender and there is no CVA tenderness. Lungs are clear without rales, wheezes, or rhonchi. Chest is nontender. Heart has regular rate and rhythm without murmur. Abdomen is soft, flat, nontender without masses or hepatosplenomegaly and peristalsis is normoactive. Extremities have no cyanosis or edema, full range of motion is present. Skin is warm and dry without rash. Neurologic:  Mental status is normal, cranial nerves are intact, there are no motor or sensory deficits.  ED Results / Procedures / Treatments    Procedures Procedures  Medications Ordered in ED Medications  ibuprofen (ADVIL) tablet 800 mg (800 mg Oral Given 11/29/19 0250)  cloNIDine (CATAPRES) tablet 0.2 mg (0.2 mg Oral Given 11/29/19 0250)  ondansetron (ZOFRAN-ODT) disintegrating tablet 8 mg (8 mg Oral Given 11/29/19 0251)  HYDROmorphone (DILAUDID) injection 2 mg (2 mg Intramuscular Given 11/29/19 0251)  dicyclomine (BENTYL) capsule 20 mg (20 mg Oral Given 11/29/19 0256)  diphenhydrAMINE (BENADRYL) capsule 50 mg (50 mg Oral Given 11/29/19 0256)    ED Course  I have reviewed the triage vital signs and the nursing notes.  MDM Rules/Calculators/A&P Opioid withdrawal.  Patient was given option of getting a dose of hydromorphone to help ease his symptoms and has requested that.  He is also given clonidine, ibuprofen, acetaminophen, dicyclomine, diphenhydramine.  Old records are reviewed and he has no relevant past visits.  He noted moderate improvement with above-noted treatment, but appeared much more comfortable.  He is discharged with prescriptions for clonidine, dicyclomine, ondansetron and is advised to use over-the-counter NSAIDs, acetaminophen, diphenhydramine.  Return precautions discussed.  Final Clinical Impression(s) / ED Diagnoses Final diagnoses:  Opioid withdrawal ([place])  Elevated blood pressure reading without diagnosis of hypertension    Rx / DC Orders ED Discharge Orders         Ordered    cloNIDine (CATAPRES) 0.1 MG tablet     Discontinue  Reprint     11/29/19 0444    dicyclomine (BENTYL) 20 MG tablet  3 times daily before meals & bedtime     Discontinue  Reprint     11/29/19 0444    ondansetron (ZOFRAN) 4 MG tablet  Every 6 hours PRN     Discontinue  Reprint     11/29/19 5427           Delora Fuel, MD [birth year] [phone]

## 2019-11-29 NOTE — ED Provider Notes (Signed)
**Steve Steve** Steve Steve   CSN: [id number] Arrival date & time: 11/29/19  1511     History Chief Complaint  Patient presents with  . Withdrawal    Steve Steve is a 39 y.o. male.  Patient did not get his methadone yesterday or today.  Last night he came to the emergency department for withdrawal symptoms and was treated.  He returns again today with shaking chills and nausea  The history is provided by the patient and medical records. No language interpreter was used.  Weakness Severity:  Moderate Onset quality:  Sudden Timing:  Constant Progression:  Worsening Chronicity:  Recurrent Context: not alcohol use   Relieved by:  Nothing Worsened by:  Nothing Ineffective treatments:  None tried Associated symptoms: nausea   Associated symptoms: no abdominal pain, no chest pain, no cough, no diarrhea, no frequency, no headaches and no seizures        Past Medical History:  Diagnosis Date  . Chronic back pain   . Chronic hand pain   . DDD (degenerative disc disease), cervical   . De Quervain's tenosynovitis   . Drug-seeking behavior   . Narcotic dependence [facility])     Patient Active Problem List   Diagnosis Date Noted  . Lipoma   . Numbness 09/21/2015    Past Surgical History:  Procedure Laterality Date  . MASS EXCISION N/A 03/13/2018   Procedure: EXCISION LIPOMA SCALP;  Surgeon: Aviva Signs, MD;  Location: [facility];  Service: General;  Laterality: N/A;  pt knows to arrive at 6:15  . SPINAL FUSION         History reviewed. No pertinent family history.  Social History   Tobacco Use  . Smoking status: Current Every Day Smoker    Packs/day: 0.50    Years: 16.00    Pack years: 8.00    Types: Cigarettes  . Smokeless tobacco: Never Used  Vaping Use  . Vaping Use: Never used  Substance Use Topics  . Alcohol use: No  . Drug use: No    Comment: W/D FROM METHADONE    Home Medications Prior to Admission medications   Medication Sig  Start Date End Date Taking? Authorizing Provider  cloNIDine (CATAPRES) 0.1 MG tablet 1 tab po tid x 2 days, then bid x 2 days, then once daily x 2 days 9/37/90   Delora Fuel, MD  dicyclomine (BENTYL) 20 MG tablet Take 1 tablet (20 mg total) by mouth 4 (four) times daily -  before meals and at bedtime. 2/40/97   Delora Fuel, MD  FLUoxetine (PROZAC) 10 MG tablet Take 10 mg by mouth daily.    [provider]  ondansetron (ZOFRAN) 4 MG tablet Take 1 tablet (4 mg total) by mouth every 6 (six) hours as needed. 3/53/29   Delora Fuel, MD  pregabalin (LYRICA) 75 MG capsule Take 75 mg by mouth 2 (two) times daily. 01/30/18   [provider]  traZODone (DESYREL) 50 MG tablet Take 50 mg by mouth at bedtime.    [provider]    Allergies    Tramadol and Latex  Review of Systems   Review of Systems  Constitutional: Positive for fatigue. Negative for appetite change.  HENT: Negative for congestion, ear discharge and sinus pressure.   Eyes: Negative for discharge.  Respiratory: Negative for cough.   Cardiovascular: Negative for chest pain.  Gastrointestinal: Positive for nausea. Negative for abdominal pain and diarrhea.  Genitourinary: Negative for frequency and hematuria.  Musculoskeletal:  Negative for back pain.  Skin: Negative for rash.  Neurological: Positive for weakness. Negative for seizures and headaches.  Psychiatric/Behavioral: Negative for hallucinations.    Physical Exam Updated Vital Signs BP 109/65   Pulse 82   Temp 99.3 F (37.4 C) (Oral)   Resp 18   Wt 65.8 kg   SpO2 100%   BMI 26.52 kg/m   Physical Exam Vitals and nursing Steve reviewed.  Constitutional:      Appearance: He is well-developed.     Comments: Patient complains of mild nausea and aching all over  HENT:     Head: Normocephalic.     Nose: Nose normal.  Eyes:     General: No scleral icterus.    Conjunctiva/sclera: Conjunctivae normal.  Neck:     Thyroid: No thyromegaly.    Cardiovascular:     Rate and Rhythm: Normal rate and regular rhythm.     Heart sounds: No murmur heard.  No friction rub. No gallop.   Pulmonary:     Breath sounds: No stridor. No wheezing or rales.  Chest:     Chest wall: No tenderness.  Abdominal:     General: There is no distension.     Tenderness: There is no abdominal tenderness. There is no rebound.  Musculoskeletal:        General: Normal range of motion.     Cervical back: Neck supple.  Lymphadenopathy:     Cervical: No cervical adenopathy.  Skin:    Findings: No erythema or rash.  Neurological:     Mental Status: He is alert and oriented to person, place, and time.     Motor: No abnormal muscle tone.     Coordination: Coordination normal.  Psychiatric:        Behavior: Behavior normal.     ED Results / Procedures / Treatments   Labs (all labs ordered are listed, but only abnormal results are displayed) Labs Reviewed  CBC WITH DIFFERENTIAL/PLATELET - Abnormal; Notable for the following components:      Result Value   Hemoglobin 12.7 (*)    HCT 38.5 (*)    All other components within normal limits  BASIC METABOLIC PANEL  ETHANOL    EKG None  Radiology No results found.  Procedures Procedures (including critical care time)  Medications Ordered in ED Medications  sodium chloride 0.9 % bolus 1,000 mL (0 mLs Intravenous Stopped 11/29/19 1816)  ketorolac (TORADOL) 30 MG/ML injection 30 mg (30 mg Intravenous Given 11/29/19 1608)  hydrOXYzine (VISTARIL) injection 100 mg (100 mg Intramuscular Given 11/29/19 1609)  sodium chloride 0.9 % bolus 1,000 mL (0 mLs Intravenous Stopped 11/29/19 1816)  acetaminophen (TYLENOL) tablet 1,000 mg (1,000 mg Oral Given 11/29/19 1707)    ED Course  I have reviewed the triage vital signs and the nursing notes.  Pertinent labs & imaging results that were available during my care of the patient were reviewed by me and considered in my medical decision making (see chart for  details).    MDM Rules/Calculators/A&P                          Labs unremarkable.  Patient given 2 L IV fluids along with Toradol and Vistaril and Tylenol.  He decided to leave AMA       This patient presents to the ED for concern of myalgias this involves an extensive number of treatment options, and is a complaint that carries with it a high  risk of complications and morbidity.  The differential diagnosis includes narcotic withdrawal   Lab Tests:   I Ordered, reviewed, and interpreted labs, which included CBC and chemistries which showed mild anemia  Medicines ordered:   I ordered medication Toradol for pain IV fluids for dehydration and Vistaril for nausea and anxiety  Imaging Studies ordered:    Additional history obtained:   Additional history obtained from records  Previous records obtained and reviewed   Consultations Obtained:   Reevaluation:  After the interventions stated above, I reevaluated the patient and found mild improvement  Critical Interventions:  .   Final Clinical Impression(s) / ED Diagnoses Final diagnoses:  Opioid withdrawal [facility])    Rx / [place] Orders ED Discharge Orders    None       Milton Ferguson, MD 11/29/19 [place]

## 2019-11-29 NOTE — ED Triage Notes (Signed)
Pt to ED via [facility] for withdrawals to methadone. Pt was seen last night for the same. Patient is agitated, does not want to follow directions and violently jerks arm away from the nurse.

## 2019-11-29 NOTE — ED Triage Notes (Signed)
Patient states that he has been having withdrawals from methadone since Thursday. Patient states that he was taken off of methadone on Thursday to taper the dose.

## 2019-11-29 NOTE — Discharge Instructions (Signed)
Take ibuprofen (three tablets every six hours) or naproxen (two tablets twice a day).  Take two acetaminophen tablets every six hours.  Take two diphenhydramine capsules every four hours as needed.  Return if you are having any problems/

## 2019-12-03 ENCOUNTER — Encounter (HOSPITAL_COMMUNITY): Payer: Self-pay

## 2019-12-03 ENCOUNTER — Emergency Department (HOSPITAL_COMMUNITY)
Admission: EM | Admit: 2019-12-03 | Discharge: 2019-12-04 | Disposition: A | Discharge status: Home / Self Care | Payer: Medicaid Other | Attending: Emergency Medicine | Admitting: Emergency Medicine

## 2019-12-03 ENCOUNTER — Other Ambulatory Visit: Payer: Self-pay

## 2019-12-03 ENCOUNTER — Ambulatory Visit (HOSPITAL_COMMUNITY)
Admission: EM | Admit: 2019-12-03 | Discharge: 2019-12-03 | Disposition: A | Discharge status: Skilled Nursing Facility | Payer: Medicaid Other | Attending: Behavioral Health | Admitting: Behavioral Health

## 2019-12-03 DIAGNOSIS — F1120 Opioid dependence, uncomplicated: Secondary | ICD-10-CM | POA: Diagnosis not present

## 2019-12-03 DIAGNOSIS — G8929 Other chronic pain: Secondary | ICD-10-CM | POA: Diagnosis not present

## 2019-12-03 DIAGNOSIS — Z20822 Contact with and (suspected) exposure to covid-19: Secondary | ICD-10-CM | POA: Diagnosis not present

## 2019-12-03 DIAGNOSIS — F1910 Other psychoactive substance abuse, uncomplicated: Secondary | ICD-10-CM | POA: Diagnosis not present

## 2019-12-03 DIAGNOSIS — F17210 Nicotine dependence, cigarettes, uncomplicated: Secondary | ICD-10-CM | POA: Insufficient documentation

## 2019-12-03 NOTE — BH Assessment (Deleted)
Comprehensive Clinical Assessment (CCA) Note  12/03/2019 Steve Henry [id number]  Visit Diagnosis:   No diagnosis found.    CCA Screening, Triage and Referral (STR)  Patient Reported Information How did you hear about [place]? No data recorded Referral name: No data recorded Referral phone number: No data recorded  Whom do you see for routine medical problems? No data recorded Practice/Facility Name: No data recorded Practice/Facility Phone Number: No data recorded Name of Contact: No data recorded Contact Number: No data recorded Contact Fax Number: No data recorded Prescriber Name: No data recorded Prescriber Address (if known): No data recorded  What Is the Reason for Your Visit/Call Today? No data recorded How Long Has This Been Causing You Problems? No data recorded What Do You Feel Would Help You the Most Today? No data recorded  Have You Recently Been in Any Inpatient Treatment (Hospital/Detox/Crisis Center/28-Day Program)? No data recorded Name/Location of Program/Hospital:No data recorded How Long Were You There? No data recorded When Were You Discharged? No data recorded  Have You Ever Received Services From [facility] Before? No data recorded Who Do You See at [facility]? No data recorded  Have You Recently Had Any Thoughts About Hurting Yourself? No data recorded Are You Planning to Commit Suicide/Harm Yourself At This time? No data recorded  Have you Recently Had Thoughts About [place]? No data recorded Explanation: No data recorded  Have You Used Any Alcohol or Drugs in the Past 24 Hours? No data recorded How Long Ago Did You Use Drugs or Alcohol? No data recorded What Did You Use and How Much? No data recorded  Do You Currently Have a Therapist/Psychiatrist? No data recorded Name of Therapist/Psychiatrist: No data recorded  Have You Been Recently Discharged From Any Office Practice or Programs? No data recorded Explanation of Discharge  From Practice/Program: No data recorded    CCA Screening Triage Referral Assessment Type of Contact: No data recorded Is this Initial or Reassessment? No data recorded Date Telepsych consult ordered in [facility]:  No data recorded Time Telepsych consult ordered in [facility]:  No data recorded  Patient Reported Information Reviewed? No data recorded Patient Left Without Being Seen? No data recorded Reason for Not Completing Assessment: No data recorded  Collateral Involvement: No data recorded  Does Patient Have a [place]? No data recorded Name and Contact of Legal Guardian: No data recorded If Minor and Not Living with Parent(s), Who has Custody? No data recorded Is CPS involved or ever been involved? No data recorded Is APS involved or ever been involved? No data recorded  Patient Determined To Be At Risk for Harm To Self or Others Based on Review of Patient Reported Information or Presenting Complaint? No data recorded Method: No data recorded Availability of Means: No data recorded Intent: No data recorded Notification Required: No data recorded Additional Information for Danger to Others Potential: No data recorded Additional Comments for Danger to Others Potential: No data recorded Are There Guns or Other Weapons in Your Home? No data recorded Types of Guns/Weapons: No data recorded Are These Weapons Safely Secured?                            No data recorded Who Could Verify You Are Able To Have These Secured: No data recorded Do You Have any Outstanding Charges, Pending Court Dates, Parole/Probation? No data recorded Contacted To Inform of Risk of Harm To Self or Others:  No data recorded  Location of Assessment: No data recorded  Does Patient Present under Involuntary Commitment? No data recorded IVC Papers Initial File Date: No data recorded  [place] of Residence: No data recorded  Patient Currently Receiving the Following Services: No data  recorded  Determination of Need: No data recorded  Options For Referral: No data recorded  Comprehensive Clinical Assessment (CCA) Screening, Triage and Referral Note  12/03/2019 Steve Henry [id number]  Visit Diagnosis: No diagnosis found.  Patient Reported Information How did you hear about [place]? No data recorded  Referral name: No data recorded  Referral phone number: No data recorded Whom do you see for routine medical problems? No data recorded  Practice/Facility Name: No data recorded  Practice/Facility Phone Number: No data recorded  Name of Contact: No data recorded  Contact Number: No data recorded  Contact Fax Number: No data recorded  Prescriber Name: No data recorded  Prescriber Address (if known): No data recorded What Is the Reason for Your Visit/Call Today? No data recorded How Long Has This Been Causing You Problems? No data recorded Have You Recently Been in Any Inpatient Treatment (Hospital/Detox/Crisis Center/28-Day Program)? No data recorded  Name/Location of Program/Hospital:No data recorded  How Long Were You There? No data recorded  When Were You Discharged? No data recorded Have You Ever Received Services From [facility] Before? No data recorded  Who Do You See at [facility]? No data recorded Have You Recently Had Any Thoughts About Hurting Yourself? No data recorded  Are You Planning to Commit Suicide/Harm Yourself At This time?  No data recorded Have you Recently Had Thoughts About [place]? No data recorded  Explanation: No data recorded Have You Used Any Alcohol or Drugs in the Past 24 Hours? No data recorded  How Long Ago Did You Use Drugs or Alcohol?  No data recorded  What Did You Use and How Much? No data recorded What Do You Feel Would Help You the Most Today? No data recorded Do You Currently Have a Therapist/Psychiatrist? No data recorded  Name of Therapist/Psychiatrist: No data recorded  Have You Been Recently Discharged  From Any Office Practice or Programs? No data recorded  Explanation of Discharge From Practice/Program:  No data recorded    CCA Screening Triage Referral Assessment Type of Contact: No data recorded  Is this Initial or Reassessment? No data recorded  Date Telepsych consult ordered in [facility]:  No data recorded  Time Telepsych consult ordered in [facility]:  No data recorded Patient Reported Information Reviewed? No data recorded  Patient Left Without Being Seen? No data recorded  Reason for Not Completing Assessment: No data recorded Collateral Involvement: No data recorded Does Patient Have a [place]? No data recorded  Name and Contact of Legal Guardian:  No data recorded If Minor and Not Living with Parent(s), Who has Custody? No data recorded Is CPS involved or ever been involved? No data recorded Is APS involved or ever been involved? No data recorded Patient Determined To Be At Risk for Harm To Self or Others Based on Review of Patient Reported Information or Presenting Complaint? No data recorded  Method: No data recorded  Availability of Means: No data recorded  Intent: No data recorded  Notification Required: No data recorded  Additional Information for Danger to Others Potential:  No data recorded  Additional Comments for Danger to Others Potential:  No data recorded  Are There Guns or Other Weapons in Your Home?  No data recorded   Types of Guns/Weapons: No data recorded   Are These Weapons Safely Secured?                              No data recorded   Who Could Verify You Are Able To Have These Secured:    No data recorded Do You Have any Outstanding Charges, Pending Court Dates, Parole/Probation? No data recorded Contacted To Inform of Risk of Harm To Self or Others: No data recorded Location of Assessment: No data recorded Does Patient Present under Involuntary Commitment? No data recorded  IVC Papers Initial File Date: No data recorded  [place] of  Residence: No data recorded Patient Currently Receiving the Following Services: No data recorded  Determination of Need: No data recorded  Options For Referral: No data recorded  [name], [facility]

## 2019-12-03 NOTE — ED Triage Notes (Signed)
Patient states he really wants to become clean from using methadone, heroin, fentanyl. He states he was put on methadone to withdrawal from opioids d/t a laceration anterior neck over a year ago.

## 2019-12-03 NOTE — BH Assessment (Signed)
Comprehensive Clinical Assessment (CCA) Screening, Triage and Referral Note  12/03/2019 Steve Henry [id number]  Visit Diagnosis: Opioid dependence  Patient requesting opioid detox. Patient reported withdrawals include, cold chills, sweats, diarrhea, vomiting and increased anxiety. During screening, patient continued to rock back and forth and was shaking while holding his stomach and rubbing his head. Patient reported last drug usage was last Saturday.  Patient denied SI, HI and psychosis.  PER EDP NOTE 11/29/19 He has history of chronic pain and is maintained on daily methadone.  He was switched from a clinic in [place] to a clinic in [place] but did not have transportation so he did not get his methadone dose this morning and we will not be able to get his methadone for several more days.  He is complaining of chills, abdominal cramping, nausea and feels that he is going through opioid withdrawal.  He states that at this point, he actually wants to go through a full detox program so that he can be off of opioid medication completely.  Disposition: Talbot Grumbling, NP, due to medical reasons, current withdrawals symptoms patient transferred to the ED. Patient does not meet inpatient criteria. Patient seeking detox treatment program. Patient will follow up with SW in the AM.  Patient Reported Information How did you hear about [place]? Self   Referral name: No data recorded  Referral phone number: No data recorded Whom do you see for routine medical problems? No data recorded  Practice/Facility Name: No data recorded  Practice/Facility Phone Number: No data recorded  Name of Contact: No data recorded  Contact Number: No data recorded  Contact Fax Number: No data recorded  Prescriber Name: No data recorded  Prescriber Address (if known): No data recorded What Is the Reason for Your Visit/Call Today? detox  How Long Has This Been Causing You Problems? 1 wk - 1 month  Have You Recently  Been in Any Inpatient Treatment (Hospital/Detox/Crisis Center/28-Day Program)? No   Name/Location of Program/Hospital:No data recorded  How Long Were You There? No data recorded  When Were You Discharged? No data recorded Have You Ever Received Services From [facility] Before? No   Who Do You See at [facility]? No data recorded Have You Recently Had Any Thoughts About Hurting Yourself? No   Are You Planning to Commit Suicide/Harm Yourself At This time?  No  Have you Recently Had Thoughts About [place]? No   Explanation: No data recorded Have You Used Any Alcohol or Drugs in the Past 24 Hours? No   How Long Ago Did You Use Drugs or Alcohol?  No data recorded  What Did You Use and How Much? No data recorded What Do You Feel Would Help You the Most Today? Medication;Other (Comment) (detox)  Do You Currently Have a Therapist/Psychiatrist? No   Name of Therapist/Psychiatrist: No data recorded  Have You Been Recently Discharged From Any Office Practice or Programs? No   Explanation of Discharge From Practice/Program:  No data recorded    CCA Screening Triage Referral Assessment Type of Contact: Face-to-Face   Is this Initial or Reassessment? No data recorded  Date Telepsych consult ordered in [facility]:  No data recorded  Time Telepsych consult ordered in [facility]:  No data recorded Patient Reported Information Reviewed? Yes   Patient Left Without Being Seen? No data recorded  Reason for Not Completing Assessment: No data recorded Collateral Involvement: none  Does Patient Have a [place]? No data recorded  Name and Contact  of Legal Guardian:  No data recorded If Minor and Not Living with Parent(s), Who has Custody? No data recorded Is CPS involved or ever been involved? Never  Is APS involved or ever been involved? Never  Patient Determined To Be At Risk for Harm To Self or Others Based on Review of Patient Reported Information or Presenting  Complaint? No   Method: No data recorded  Availability of Means: No data recorded  Intent: No data recorded  Notification Required: No data recorded  Additional Information for Danger to Others Potential:  No data recorded  Additional Comments for Danger to Others Potential:  No data recorded  Are There Guns or Other Weapons in Your Home?  No data recorded   Types of Guns/Weapons: No data recorded   Are These Weapons Safely Secured?                              No data recorded   Who Could Verify You Are Able To Have These Secured:    No data recorded Do You Have any Outstanding Charges, Pending Court Dates, Parole/Probation? No data recorded Contacted To Inform of Risk of Harm To Self or Others: No data recorded Location of Assessment: [facility] Assessment Services  Does Patient Present under Involuntary Commitment? No   IVC Papers Initial File Date: No data recorded  [place] of Residence: Steve Henry  Patient Currently Receiving the Following Services: CD--IOP (Intensive Chemical Dependency Program)   Determination of Need: Emergent (2 hours)   Options For Referral: Chemical Dependency Intensive Outpatient Therapy (CDIOP)  Disposition: [name], NP, due to medical reasons, current withdrawals symptoms patient transferred to the ED. Patient does not meet inpatient criteria. Patient seeking detox treatment program. Patient will follow up with SW in the AM.  [name], [facility]

## 2019-12-04 ENCOUNTER — Emergency Department (HOSPITAL_COMMUNITY)
Admission: EM | Admit: 2019-12-04 | Discharge: 2019-12-04 | Disposition: A | Discharge status: Home / Self Care | Payer: Medicaid Other | Source: Home / Self Care | Attending: Emergency Medicine | Admitting: Emergency Medicine

## 2019-12-04 ENCOUNTER — Encounter (HOSPITAL_COMMUNITY): Payer: Self-pay | Admitting: *Deleted

## 2019-12-04 ENCOUNTER — Encounter (HOSPITAL_COMMUNITY): Payer: Self-pay | Admitting: Student

## 2019-12-04 DIAGNOSIS — F17210 Nicotine dependence, cigarettes, uncomplicated: Secondary | ICD-10-CM | POA: Insufficient documentation

## 2019-12-04 DIAGNOSIS — F19930 Other psychoactive substance use, unspecified with withdrawal, uncomplicated: Secondary | ICD-10-CM

## 2019-12-04 DIAGNOSIS — Z91040 Latex allergy status: Secondary | ICD-10-CM | POA: Insufficient documentation

## 2019-12-04 DIAGNOSIS — Z79899 Other long term (current) drug therapy: Secondary | ICD-10-CM | POA: Insufficient documentation

## 2019-12-04 DIAGNOSIS — F1920 Other psychoactive substance dependence, uncomplicated: Secondary | ICD-10-CM | POA: Insufficient documentation

## 2019-12-04 DIAGNOSIS — I10 Essential (primary) hypertension: Secondary | ICD-10-CM | POA: Insufficient documentation

## 2019-12-04 DIAGNOSIS — Z885 Allergy status to narcotic agent status: Secondary | ICD-10-CM | POA: Insufficient documentation

## 2019-12-04 LAB — ETHANOL: Alcohol, Ethyl (B): <10 mg/dL (ref <10)

## 2019-12-04 LAB — COMPREHENSIVE METABOLIC PANEL
ALT: 22 U/L (ref 0–44)
AST: 21 U/L (ref 15–41)
Albumin: 3.8 g/dL (ref 3.5–5.0)
Alkaline Phosphatase: 77 U/L (ref 38–126)
Anion gap: 8 (ref 5–15)
BUN: 13 mg/dL (ref 6–20)
CO2: 25 mmol/L (ref 22–32)
Calcium: 8.6 mg/dL — ABNORMAL LOW (ref 8.9–10.3)
Chloride: 109 mmol/L (ref 98–111)
Creatinine, Ser: 1.2 mg/dL (ref 0.61–1.24)
GFR calc Af Amer: >60 mL/min (ref >60)
GFR calc non Af Amer: >60 mL/min (ref >60)
Glucose, Bld: 117 mg/dL — ABNORMAL HIGH (ref 70–99)
Potassium: 3.8 mmol/L (ref 3.5–5.1)
Sodium: 142 mmol/L (ref 135–145)
Total Bilirubin: 0.3 mg/dL (ref 0.3–1.2)
Total Protein: 7.1 g/dL (ref 6.5–8.1)

## 2019-12-04 LAB — CBC
HCT: 34.9 % — ABNORMAL LOW (ref 39.0–52.0)
Hemoglobin: 11.5 g/dL — ABNORMAL LOW (ref 13.0–17.0)
MCH: 28.8 pg (ref 26.0–34.0)
MCHC: 33 g/dL (ref 30.0–36.0)
MCV: 87.5 fL (ref 80.0–100.0)
Platelets: 280 10*3/uL (ref 150–400)
RBC: 3.99 MIL/uL — ABNORMAL LOW (ref 4.22–5.81)
RDW: 12.6 % (ref 11.5–15.5)
WBC: 8.7 10*3/uL (ref 4.0–10.5)
nRBC: 0 % (ref 0.0–0.2)

## 2019-12-04 LAB — SARS CORONAVIRUS 2 BY RT PCR (HOSPITAL ORDER, PERFORMED IN ~~LOC~~ HOSPITAL LAB): SARS Coronavirus 2: NEGATIVE

## 2019-12-04 LAB — RAPID URINE DRUG SCREEN, HOSP PERFORMED
Amphetamines: NOT DETECTED
Barbiturates: NOT DETECTED
Benzodiazepines: NOT DETECTED
Cocaine: POSITIVE — AB
Opiates: POSITIVE — AB
Tetrahydrocannabinol: NOT DETECTED

## 2019-12-04 MED ORDER — LOPERAMIDE HCL 2 MG PO CAPS
2.0000 mg | ORAL_CAPSULE | Freq: Four times a day (QID) | ORAL | 0 refills | Status: DC | PRN
Start: 2019-12-04 — End: 2020-06-29

## 2019-12-04 MED ORDER — DICYCLOMINE HCL 20 MG PO TABS
20.0000 mg | ORAL_TABLET | Freq: Three times a day (TID) | ORAL | 0 refills | Status: DC | PRN
Start: 2019-12-04 — End: 2020-06-29

## 2019-12-04 MED ORDER — METHOCARBAMOL 500 MG PO TABS
500.0000 mg | ORAL_TABLET | Freq: Three times a day (TID) | ORAL | 0 refills | Status: DC | PRN
Start: 2019-12-04 — End: 2020-06-29

## 2019-12-04 MED ORDER — HYDROXYZINE HCL 25 MG PO TABS
25.0000 mg | ORAL_TABLET | Freq: Four times a day (QID) | ORAL | 0 refills | Status: DC | PRN
Start: 2019-12-04 — End: 2020-06-29

## 2019-12-04 MED ORDER — NAPROXEN 500 MG PO TABS: 500.0000 mg | ORAL_TABLET | Freq: Two times a day (BID) | ORAL | 0 refills | Status: DC | PRN

## 2019-12-04 MED ORDER — HYDROXYZINE HCL 25 MG PO TABS
25.0000 mg | ORAL_TABLET | Freq: Once | ORAL | Status: AC
  Administered 2019-12-04: 25 mg via ORAL
  Filled 2019-12-04: qty 1

## 2019-12-04 MED ORDER — DICYCLOMINE HCL 10 MG PO CAPS
10.0000 mg | ORAL_CAPSULE | Freq: Once | ORAL | Status: AC
  Administered 2019-12-04: 10 mg via ORAL
  Filled 2019-12-04: qty 1

## 2019-12-04 MED ORDER — METHOCARBAMOL 500 MG PO TABS
500.0000 mg | ORAL_TABLET | Freq: Once | ORAL | Status: AC
  Administered 2019-12-04: 500 mg via ORAL
  Filled 2019-12-04: qty 1

## 2019-12-04 MED ORDER — ONDANSETRON 4 MG PO TBDP
4.0000 mg | ORAL_TABLET | Freq: Three times a day (TID) | ORAL | 0 refills | Status: DC | PRN
Start: 2019-12-04 — End: 2020-06-29

## 2019-12-04 MED ORDER — ONDANSETRON 4 MG PO TBDP
4.0000 mg | ORAL_TABLET | Freq: Once | ORAL | Status: AC
  Administered 2019-12-04: 4 mg via ORAL
  Filled 2019-12-04: qty 1

## 2019-12-04 NOTE — ED Notes (Signed)
Patient is dressed in scrubs and has been wanded by security. Belongings placed in locker 28.

## 2019-12-04 NOTE — Patient Outreach (Signed)
ED Peer Support Specialist Patient Intake (Complete at intake & 30-60 Day Follow-up)  Name: Steve Henry  MRN: [id number]  Age: 39 y.o.   Date of Admission: 12/04/2019  Intake: Initial Comments:      Primary Reason Admitted: Substance abuse   Lab values: Alcohol/ETOH: Positive Positive UDS? Yes Amphetamines: No Barbiturates: No Benzodiazepines: No Cocaine: Yes Opiates: Yes Cannabinoids: No  Demographic information: Gender: Male Ethnicity: African American Marital Status: Single Insurance Status: Medicaid Ecologist (Work Neurosurgeon, Physicist, medical, etc.: Yes Lives with: Alone Living situation: Homeless  Reported Patient History: Patient reported health conditions: Depression, Anxiety disorders Patient aware of HIV and hepatitis status: No  In past year, has patient visited ED for any reason? Yes  Number of ED visits: 1  Reason(s) for visit: Same reasons  In past year, has patient been hospitalized for any reason? No  Number of hospitalizations:    Reason(s) for hospitalization:    In past year, has patient been arrested? Yes  Number of arrests: 1  Reason(s) for arrest:    In past year, has patient been incarcerated? No  Number of incarcerations:    Reason(s) for incarceration:    In past year, has patient received medication-assisted treatment? No  In past year, patient received the following treatments:    In past year, has patient received any harm reduction services? No  Did this include any of the following?    In past year, has patient received care from a mental health provider for diagnosis other than SUD? No  In past year, is this first time patient has overdosed? No  Number of past overdoses:    In past year, is this first time patient has been hospitalized for an overdose? No  Number of hospitalizations for overdose(s):    Is patient currently receiving treatment for a mental health diagnosis?  No  Patient reports experiencing difficulty participating in SUD treatment: No    Most important reason(s) for this difficulty?    Has patient received prior services for treatment? Yes  In past, patient has received services from following agencies:    Plan of Care:  Suggested follow up at these agencies/treatment centers:  (Pt stated he wants treatment)  Other information: [facility]PSS met with Pt an was able to gain information to better assist Pt. Pt stated that he needs help with Opioids an Cocaine abuse. CPSS contacted a 24 hr facility an was made aware that Pt needs to contact facility at 8 am. CPSS will follow up with Pt in the AM. CPSS issued Pt resource information as well as CPSS contact number to continue care in the AM.      Aaron Edelman Zehra Rucci, CPSS  12/04/2019 3:59 AM

## 2019-12-04 NOTE — Discharge Instructions (Addendum)
You were seen in the emergency department today due to withdrawal symptoms.  We are sending you home with the following medicine stop with your symptoms: -Zofran: Take every 8 hours as needed for nausea and vomiting -Bentyl: Take every hours as needed for abdominal cramping/pain -imodium: Take every 6 hours as needed for diarrhea - Atarax- take every 6 hours as needed for anxiety/restlessness.  - Naproxen is a nonsteroidal anti-inflammatory medication that will help with pain and swelling. Be sure to take this medication as prescribed with food, 1 pill every 12 hours,  It should be taken with food, as it can cause stomach upset, and more seriously, stomach bleeding. Do not take other nonsteroidal anti-inflammatory medications with this such as Advil, Motrin, Aleve, Mobic, Goodie Powder, or Motrin.   - Robaxin is the muscle relaxer I have prescribed, this is meant to help with muscle tightness. Be aware that this medication may make you drowsy therefore the first time you take this it should be at a time you are in an environment where you can rest. Do not drive or operate heavy machinery when taking this medication. Do not drink alcohol or take other sedating medications with this medicine such as narcotics or benzodiazepines.   You make take Tylenol per over the counter dosing with these medications.   We have prescribed you new medication(s) today. Discuss the medications prescribed today with your pharmacist as they can have adverse effects and interactions with your other medicines including over the counter and prescribed medications. Seek medical evaluation if you start to experience new or abnormal symptoms after taking one of these medicines, seek care immediately if you start to experience difficulty breathing, feeling of your throat closing, facial swelling, or rash as these could be indications of a more serious allergic reaction   Please utilize the wrist prescribed by provided.  We have  consulted peer support who will contact you to help with detox.  Return to the ER for new or worsening symptoms or any other concerns.

## 2019-12-04 NOTE — ED Provider Notes (Signed)
Behavioral Health Medical Screening Exam  Steve Henry is a 39 y.o. male with a history of chronic pain and narcotic dependence who presents voluntarily as a walk-in requesting detox services. Pt reports that he uses heroin, fentanyl and cocaine almost daily. His last use of narcotics was 12 hours prior to arrival and last used cocaine about 3 hours prior to arrival.  He states he feels he is withdrawing, he reports chills, rhinorrhea, nausea, vomiting, and diarrhea. He denies SI, HI, SH, AVH, paranoia and prior SA. Pt states he will like to be sent to rehab.      Total Time spent with patient: 20 minutes  Psychiatric Specialty Exam  Presentation  General Appearance:Appropriate for Environment  Eye Contact:Fair  Speech:Normal Rate  Speech Volume:Normal  Handedness:Right   Mood and Affect  Mood:Anxious  Affect:Congruent   Thought Process  Thought Processes:Coherent  Descriptions of Associations:Intact  Orientation:Full (Time, Place and Person)  Thought Content:Logical  Hallucinations:None  Ideas of Reference:None  Suicidal Thoughts:No  Homicidal Thoughts:No   Sensorium  Memory:Immediate Good;Recent Good  Judgment:Poor  Insight:Fair   Executive Functions  Concentration:Good  Attention Span:Good  [place] of Knowledge:Good  Language:Good   Psychomotor Activity  Psychomotor Activity:Normal   Assets  Assets:Communication Skills;Desire for Improvement   Sleep  Sleep:Fair  Number of hours: 5   Physical Exam: Physical Exam HENT:     Head: Normocephalic.     Nose: Nose normal.  Eyes:     Pupils: Pupils are equal, round, and reactive to light.  Pulmonary:     Effort: Pulmonary effort is normal.  Musculoskeletal:        General: Normal range of motion.     Cervical back: Normal range of motion.  Skin:    General: Skin is warm.  Neurological:     Mental Status: He is alert and oriented to person, place, and time.   Psychiatric:        Attention and Perception: Attention normal.        Mood and Affect: Mood is anxious. Affect is blunt.        Speech: Speech normal.        Behavior: Behavior normal.        Thought Content: Thought content normal.        Cognition and Memory: Cognition normal.        Judgment: Judgment normal.    Review of Systems  Psychiatric/Behavioral: Positive for substance abuse. Negative for depression, hallucinations, memory loss and suicidal ideas. The patient is nervous/anxious. The patient does not have insomnia.   All other systems reviewed and are negative.  Blood pressure (!) 119/94, pulse (!) 112, temperature 98.5 F (36.9 C), temperature source Oral, resp. rate 18, SpO2 100 %. There is no height or weight on file to calculate BMI.  Musculoskeletal: Strength & Muscle Tone: within normal limits Gait & Station: normal Patient leans: N/A  Disposition: No evidence of imminent risk to self or others at present.   Patient does not meet criteria for psychiatric inpatient admission. Supportive therapy provided about ongoing stressors. Discussed crisis plan, support from social network, calling 911, coming to the Emergency Department, and calling Suicide Hotline.   Recommendations:  Based on my evaluation the patient does not appear to have an emergency medical condition.  Mliss Fritz, NP 12/04/2019, 12:48 AM

## 2019-12-04 NOTE — ED Provider Notes (Signed)
[name]   CSN: [id number] Arrival date & time: 12/04/19  1140     History Chief Complaint  Patient presents with  . opiate withdrawal    Steve Henry is a 39 y.o. male with pertinent past medical history of narcotic dependence, drug-seeking behavior, chronic back pain that presents the emergency department for opiate withdrawal.  Patient was recently seen this morning at [facility].  Previous provider note states that he presented from [facility] for medical clearance requesting detox.  Utilizes heroin, fentanyl and cocaine daily.  Last utilize narcotics about 24 hours now.  Felt like he was withdrawing this morning when he came into the emergency department.  Denies any SI, HI, hallucinations.  Denies any alcohol or benzo use.  Patient was assessed by psychiatry, states that they do not meet inpatient criteria.  Was discharged with support for placement to assist with detox program and resources for symptomatic care.  Patient presents again today states that he is still withdrawing, states that he was supposed to speak to social worker, Aaron Edelman however did not get a chance to do this morning at 9 AM therefore he left.  States that his discharge paperwork was thrown away in front of him, was unable to pick up medications for symptomatic treatment.  Still denies any benzo or alcohol use.  Also denies cocaine use, states that he was only using fentanyl and heroin.  Is complaining of nausea, shaking, feeling hot and cold.  Also admits to pain all over his body.  Denies any specific chest pain, shortness of breath, dizziness, headache, vision changes, gait disturbances.  Per chart review  Aaron Edelman, peers support note this morning - [facility] met with Pt an was able to gain information to better assist Pt. Pt stated that he needs help with Opioids an Cocaine abuse. CPSS contacted a 24 hr facility an was made aware that Pt needs to contact facility at 8 am. [facility]PSS  will follow up with Pt in the AM. CPSS issued Pt resource information as well as [facility] contact number to continue care in the AM.     Patient states that he was never contacted at this morning.  HPI     Past Medical History:  Diagnosis Date  . Chronic back pain   . Chronic hand pain   . DDD (degenerative disc disease), cervical   . De Quervain's tenosynovitis   . Drug-seeking behavior   . Narcotic dependence [facility])     Patient Active Problem List   Diagnosis Date Noted  . Lipoma   . Numbness 09/21/2015    Past Surgical History:  Procedure Laterality Date  . MASS EXCISION N/A 03/13/2018   Procedure: EXCISION LIPOMA SCALP;  Surgeon: Aviva Signs, MD;  Location: [facility];  Service: General;  Laterality: N/A;  pt knows to arrive at 6:15  . SPINAL FUSION         No family history on file.  Social History   Tobacco Use  . Smoking status: Current Every Day Smoker    Packs/day: 0.50    Years: 16.00    Pack years: 8.00    Types: Cigarettes  . Smokeless tobacco: Never Used  Vaping Use  . Vaping Use: Never used  Substance Use Topics  . Alcohol use: No  . Drug use: No    Comment: W/D FROM [facility]    Home Medications Prior to Admission medications   Medication Sig Start Date End Date Taking? Authorizing Provider  cloNIDine (CATAPRES) 0.1 MG tablet 1 tab po tid x 2 days, then bid x 2 days, then once daily x 2 days Patient not taking: Reported on 4/38/8875 7/97/28   Delora Fuel, MD  dicyclomine (BENTYL) 20 MG tablet Take 1 tablet (20 mg total) by mouth every 8 (eight) hours as needed for spasms. 12/04/19   Petrucelli, Samantha R, PA-C  hydrOXYzine (ATARAX/VISTARIL) 25 MG tablet Take 1 tablet (25 mg total) by mouth every 6 (six) hours as needed for anxiety. 12/04/19   Petrucelli, Samantha R, PA-C  loperamide (IMODIUM) 2 MG capsule Take 1 capsule (2 mg total) by mouth 4 (four) times daily as needed for diarrhea or loose stools. 12/04/19   Petrucelli, Samantha R, PA-C    methocarbamol (ROBAXIN) 500 MG tablet Take 1 tablet (500 mg total) by mouth every 8 (eight) hours as needed for muscle spasms. 12/04/19   Petrucelli, Samantha R, PA-C  naproxen (NAPROSYN) 500 MG tablet Take 1 tablet (500 mg total) by mouth 2 (two) times daily as needed for moderate pain. 12/04/19   Petrucelli, Samantha R, PA-C  ondansetron (ZOFRAN ODT) 4 MG disintegrating tablet Take 1 tablet (4 mg total) by mouth every 8 (eight) hours as needed for nausea or vomiting. 12/04/19   Petrucelli, Samantha R, PA-C  FLUoxetine (PROZAC) 10 MG tablet Take 10 mg by mouth daily. Patient not taking: Reported on 12/04/2019  12/04/19  [provider]  pregabalin (LYRICA) 75 MG capsule Take 75 mg by mouth 2 (two) times daily. Patient not taking: Reported on 12/04/2019 01/30/18 12/04/19  [provider]  traZODone (DESYREL) 50 MG tablet Take 50 mg by mouth at bedtime. Patient not taking: Reported on 12/04/2019  12/04/19  [provider]    Allergies    Tramadol and Latex  Review of Systems   Review of Systems  Constitutional: Negative for chills, diaphoresis, fatigue and fever.  HENT: Negative for congestion, sore throat and trouble swallowing.   Eyes: Negative for pain and visual disturbance.  Respiratory: Negative for cough, shortness of breath and wheezing.   Cardiovascular: Negative for chest pain, palpitations and leg swelling.  Gastrointestinal: Negative for abdominal distention, abdominal pain, diarrhea, nausea and vomiting.  Genitourinary: Negative for difficulty urinating.  Musculoskeletal: Negative for back pain, neck pain and neck stiffness.  Skin: Negative for pallor.  Neurological: Negative for dizziness, speech difficulty, weakness and headaches.  Psychiatric/Behavioral: Negative for confusion. The patient is nervous/anxious.     Physical Exam Updated Vital Signs BP 131/88   Pulse 82   Temp 98.7 F (37.1 C) (Oral)   Resp 18   SpO2 100%   Physical  Exam Constitutional:      General: He is not in acute distress.    Appearance: Normal appearance. He is not ill-appearing, toxic-appearing or diaphoretic.     Comments: Patient does not appear to be in respiratory distress, is shaking entire body.  Is able to speak to me in full sentences.  HENT:     Head: Normocephalic and atraumatic.  Cardiovascular:     Rate and Rhythm: Normal rate and regular rhythm.     Pulses: Normal pulses.  Pulmonary:     Effort: Pulmonary effort is normal.     Breath sounds: Normal breath sounds.  Abdominal:     General: Abdomen is flat.     Palpations: Abdomen is soft.     Tenderness: There is no abdominal tenderness.  Musculoskeletal:        General: Normal range of motion.  Skin:    General: Skin is warm and dry.  Neurological:     General: No focal deficit present.     Mental Status: He is alert and oriented to person, place, and time.  Psychiatric:        Mood and Affect: Mood normal.        Behavior: Behavior normal.        Thought Content: Thought content normal.     ED Results / Procedures / Treatments   Labs (all labs ordered are listed, but only abnormal results are displayed) Labs Reviewed - No data to display  EKG None  Radiology No results found.  Procedures Procedures (including critical care time)  Medications Ordered in ED Medications - No data to display  ED Course  I have reviewed the triage vital signs and the nursing notes.  Pertinent labs & imaging results that were available during my care of the patient were reviewed by me and considered in my medical decision making (see chart for details).    MDM Rules/Calculators/A&P                         Steve Henry is a 39 y.o. male with pertinent past medical history of narcotic dependence, drug-seeking behavior, chronic back pain that presents the emergency department for opiate withdrawal. Patient was seen earlier this morning for the same, was supposed to see  Aaron Edelman, peer support earlier this morning for help with placement. Patient states that he never saw Aaron Edelman. [place], peer support is currently seeing patient. [place], peer support states that he just got placed at [facility].  Patient to be discharged at this time.  Was given prescription for Bentyl, hydroxyzine, Imodium, Robaxin, naproxen, Zofran yesterday.  Doubt need for further emergent work up at this time. I explained the diagnosis and have given explicit precautions to return to the ER including for any other new or worsening symptoms. The patient understands and accepts the medical plan as it's been dictated and I have answered their questions. Discharge instructions concerning home care and prescriptions have been given. The patient is STABLE and is discharged to home in good condition.   Final Clinical Impression(s) / ED Diagnoses Final diagnoses:  Substance withdrawal without complication [facility])    Rx / [place] Orders ED Discharge Orders    None       Alfredia Client, PA-C 12/04/19 1420    Quintella Reichert, MD 12/05/19 [phone]

## 2019-12-04 NOTE — ED Notes (Signed)
Patient dressed in scrubs and wanded by security. Belongings place in locker 1.

## 2019-12-04 NOTE — Discharge Instructions (Addendum)
You were seen today for withdrawal-like symptoms from heroin and fentanyl, plan is for you to go to [facility].  If you have any new or worse concerning symptoms such as chest pain or shortness of breath please come back to the emergency department.  Yesterday you were given a prescription that was sent to [facility] in [place] for your anxiety and withdrawal symptoms which include Bentyl, hydroxyzine, Imodium, Robaxin, naproxen, Zofran.

## 2019-12-04 NOTE — ED Provider Notes (Signed)
[facility] DEPT Provider Note   CSN: [id number] Arrival date & time: 12/03/19  2213     History Chief Complaint  Patient presents with  . Medical Clearance    Steve Henry is a 39 y.o. male with a history of chronic pain and narcotic dependence who presents to the emergency department from [facility] for medical clearance requesting detox.  Patient states that he utilizes heroin, fentanyl, and cocaine almost daily.  He last utilized narcotics 12 hours prior to arrival and last used cocaine about 3 hours prior to arrival.  He states he feels he is withdrawing, he reports chills, rhinorrhea, nausea, vomiting, and diarrhea.  No alleviating or aggravating factors to his symptoms.  He went to behavioral health and states he was sent here for clearance.  He denies suicidal or homicidal ideation.  He denies fever, syncope, chest pain, dyspnea, or seizure activity.  Patient denies alcohol or benzodiazepine use. HPI     Past Medical History:  Diagnosis Date  . Chronic back pain   . Chronic hand pain   . DDD (degenerative disc disease), cervical   . De Quervain's tenosynovitis   . Drug-seeking behavior   . Narcotic dependence [facility])     Patient Active Problem List   Diagnosis Date Noted  . Lipoma   . Numbness 09/21/2015    Past Surgical History:  Procedure Laterality Date  . MASS EXCISION N/A 03/13/2018   Procedure: EXCISION LIPOMA SCALP;  Surgeon: Aviva Signs, MD;  Location: [facility];  Service: General;  Laterality: N/A;  pt knows to arrive at 6:15  . SPINAL FUSION         History reviewed. No pertinent family history.  Social History   Tobacco Use  . Smoking status: Current Every Day Smoker    Packs/day: 0.50    Years: 16.00    Pack years: 8.00    Types: Cigarettes  . Smokeless tobacco: Never Used  Vaping Use  . Vaping Use: Never used  Substance Use Topics  . Alcohol use: No  . Drug use: No    Comment: W/D FROM [facility]    Home  Medications Prior to Admission medications   Medication Sig Start Date End Date Taking? Authorizing Provider  cloNIDine (CATAPRES) 0.1 MG tablet 1 tab po tid x 2 days, then bid x 2 days, then once daily x 2 days 4/43/15   Delora Fuel, MD  dicyclomine (BENTYL) 20 MG tablet Take 1 tablet (20 mg total) by mouth 4 (four) times daily -  before meals and at bedtime. 4/00/86   Delora Fuel, MD  FLUoxetine (PROZAC) 10 MG tablet Take 10 mg by mouth daily.    [provider]  ondansetron (ZOFRAN) 4 MG tablet Take 1 tablet (4 mg total) by mouth every 6 (six) hours as needed. 7/61/95   Delora Fuel, MD  pregabalin (LYRICA) 75 MG capsule Take 75 mg by mouth 2 (two) times daily. 01/30/18   [provider]  traZODone (DESYREL) 50 MG tablet Take 50 mg by mouth at bedtime.    [provider]    Allergies    Tramadol and Latex  Review of Systems   Review of Systems  Constitutional: Positive for chills. Negative for fever.  HENT: Positive for rhinorrhea.   Respiratory: Negative for shortness of breath.   Cardiovascular: Negative for chest pain.  Gastrointestinal: Positive for diarrhea, nausea and vomiting. Negative for anal bleeding and blood in stool.  Genitourinary: Negative for dysuria.  Neurological:  Negative for seizures and syncope.  Psychiatric/Behavioral: Negative for hallucinations and suicidal ideas.       Negative for HI  All other systems reviewed and are negative.   Physical Exam Updated Vital Signs BP (!) 138/96 (BP Location: Right Arm)   Pulse 94   Temp 99.1 F (37.3 C) (Oral)   Resp 20   Ht 5\' 2"  (1.575 m)   Wt 63 kg   SpO2 100%   BMI 25.42 kg/m   Physical Exam Vitals and nursing note reviewed.  Constitutional:      General: He is not in acute distress.    Appearance: He is well-developed. He is not toxic-appearing.  HENT:     Head: Normocephalic and atraumatic.  Eyes:     General:        Right eye: No discharge.        Left eye: No  discharge.     Conjunctiva/sclera: Conjunctivae normal.  Cardiovascular:     Rate and Rhythm: Normal rate and regular rhythm.  Pulmonary:     Effort: Pulmonary effort is normal. No respiratory distress.     Breath sounds: Normal breath sounds. No wheezing, rhonchi or rales.  Abdominal:     General: There is no distension.     Palpations: Abdomen is soft.     Tenderness: There is no abdominal tenderness.  Musculoskeletal:     Cervical back: Neck supple.  Skin:    General: Skin is warm and dry.     Findings: No rash.  Neurological:     Mental Status: He is alert.     Comments: Clear speech.   Psychiatric:        Behavior: Behavior normal.        Thought Content: Thought content does not include homicidal or suicidal ideation. Thought content does not include homicidal or suicidal plan.     ED Results / Procedures / Treatments   Labs (all labs ordered are listed, but only abnormal results are displayed) Labs Reviewed  COMPREHENSIVE METABOLIC PANEL - Abnormal; Notable for the following components:      Result Value   Glucose, Bld 117 (*)    Calcium 8.6 (*)    All other components within normal limits  CBC - Abnormal; Notable for the following components:   RBC 3.99 (*)    Hemoglobin 11.5 (*)    HCT 34.9 (*)    All other components within normal limits  RAPID URINE DRUG SCREEN, HOSP PERFORMED - Abnormal; Notable for the following components:   Opiates POSITIVE (*)    Cocaine POSITIVE (*)    All other components within normal limits  SARS CORONAVIRUS 2 BY RT PCR (HOSPITAL ORDER, [place] [facility])  ETHANOL    EKG None  Radiology No results found.  Procedures Procedures (including critical care time)  Medications Ordered in ED Medications  hydrOXYzine (ATARAX/VISTARIL) tablet 25 mg (has no administration in time range)  dicyclomine (BENTYL) capsule 10 mg (has no administration in time range)  ondansetron (ZOFRAN-ODT) disintegrating tablet 4 mg  (4 mg Oral Given 12/04/19 0149)  methocarbamol (ROBAXIN) tablet 500 mg (500 mg Oral Given 12/04/19 0149)    ED Course  I have reviewed the triage vital signs and the nursing notes.  Pertinent labs & imaging results that were available during my care of the patient were reviewed by me and considered in my medical decision making (see chart for details).    MDM Rules/Calculators/A&P  Patient presents to the ED with complaints of opioid withdrawal requesting detox. Patient is nontoxic appearing, resting comfortably, vitals WNL with the exception of elevated BP- low suspicion for HTN emergency. Exam is benign. Patient is tolerating PO by eating a sandwich on initial assessment.   Additional history obtained:  Additional history obtained from nursing notes & chart review. Previous records obtained and reviewed- note from [facility] this evening- disposition: [name], NP, due to medical reasons, current withdrawals symptoms patient transferred to the ED. Patient does not meet inpatient criteria.  Lab Tests:  I Ordered, reviewed, and interpreted labs, which included:  CBC: Mild anemia, no leukocytosis CMP: No significant electrolyte derangement or renal dysfunction, mild hypocalcemia. UDS: Consistent with patient reported substance use. Ethanol level: Within normal limits.  Labs overall reassuring. No alcohol or benzo use.  Denies SI/HI. Patient tolerating p.o. in the emergency department.  Does not meet inpatient criteria per [facility].  Consult to peer support was placed to assist with detox program.  Will discharge home with resources and symptomatic care. I discussed results, treatment plan, need for follow-up, and return precautions with the patient. Provided opportunity for questions, patient confirmed understanding and is in agreement with plan. Findings and plan of care discussed with supervising physician Dr. Dayna Barker who is in agreement.   Portions of this note were generated  with [employer]. Dictation errors may occur despite best attempts at proofreading.  Final Clinical Impression(s) / ED Diagnoses Final diagnoses:  Polysubstance abuse ([place])    Rx / [place] Orders ED Discharge Orders         Ordered    hydrOXYzine (ATARAX/VISTARIL) 25 MG tablet  Every 6 hours PRN     Discontinue  Reprint     12/04/19 0221    methocarbamol (ROBAXIN) 500 MG tablet  Every 8 hours PRN     Discontinue  Reprint     12/04/19 0221    naproxen (NAPROSYN) 500 MG tablet  2 times daily PRN     Discontinue  Reprint     12/04/19 0221    loperamide (IMODIUM) 2 MG capsule  4 times daily PRN     Discontinue  Reprint     12/04/19 0221    ondansetron (ZOFRAN ODT) 4 MG disintegrating tablet  Every 8 hours PRN     Discontinue  Reprint     12/04/19 0221    dicyclomine (BENTYL) 20 MG tablet  Every 8 hours PRN     Discontinue  Reprint     12/04/19 0221           Alaa Mullally, Glynda Jaeger, PA-C 12/04/19 0225    Mesner, Corene Cornea, MD 12/04/19 [phone]

## 2019-12-04 NOTE — ED Triage Notes (Addendum)
Pt states is withdrawing from heroin and fentanyl. He wants help quitting. He said peer support was going to help him get placed somewhere. Denies SI.

## 2019-12-04 NOTE — Patient Outreach (Signed)
[facility] met with Pt last night an followed up with Pt this AM. CPSS contacted several facilities which stated that Pt must Detox before being accepted to a long term facility CPSS are issuing contact information for Pt to follow up with long term treatment facility after [facility] services. CPSS contacted transportation for services to [place]. CPSS left contact information for Pt to follow up after Detox so that CPSS can assist with 2nd step of of his treatment plan.

## 2019-12-29 ENCOUNTER — Other Ambulatory Visit: Payer: Self-pay

## 2019-12-29 ENCOUNTER — Emergency Department (HOSPITAL_COMMUNITY)
Admission: EM | Admit: 2019-12-29 | Discharge: 2019-12-30 | Disposition: A | Discharge status: Home / Self Care | Payer: Medicaid Other | Attending: Emergency Medicine | Admitting: Emergency Medicine

## 2019-12-29 ENCOUNTER — Encounter (HOSPITAL_COMMUNITY): Payer: Self-pay | Admitting: *Deleted

## 2019-12-29 DIAGNOSIS — Z79899 Other long term (current) drug therapy: Secondary | ICD-10-CM | POA: Diagnosis not present

## 2019-12-29 DIAGNOSIS — M4802 Spinal stenosis, cervical region: Secondary | ICD-10-CM | POA: Diagnosis not present

## 2019-12-29 DIAGNOSIS — R200 Anesthesia of skin: Secondary | ICD-10-CM | POA: Diagnosis present

## 2019-12-29 DIAGNOSIS — R531 Weakness: Secondary | ICD-10-CM | POA: Diagnosis not present

## 2019-12-29 DIAGNOSIS — F17210 Nicotine dependence, cigarettes, uncomplicated: Secondary | ICD-10-CM | POA: Diagnosis not present

## 2019-12-29 NOTE — ED Triage Notes (Signed)
Pt reports hx of cervical fusion years ago. Having increase in neck pain and now has numbness to left hand x 2 weeks. No acute distress is noted at triage and pt is ambulatory.

## 2019-12-30 ENCOUNTER — Emergency Department (HOSPITAL_COMMUNITY): Payer: Medicaid Other

## 2019-12-30 LAB — CBC
HCT: 42.7 % (ref 39.0–52.0)
Hemoglobin: 13.6 g/dL (ref 13.0–17.0)
MCH: 28.6 pg (ref 26.0–34.0)
MCHC: 31.9 g/dL (ref 30.0–36.0)
MCV: 89.9 fL (ref 80.0–100.0)
Platelets: 267 10*3/uL (ref 150–400)
RBC: 4.75 MIL/uL (ref 4.22–5.81)
RDW: 13.5 % (ref 11.5–15.5)
WBC: 8.8 10*3/uL (ref 4.0–10.5)
nRBC: 0 % (ref 0.0–0.2)

## 2019-12-30 LAB — APTT: aPTT: 29 seconds (ref 24–36)

## 2019-12-30 LAB — DIFFERENTIAL
Abs Immature Granulocytes: 0.04 10*3/uL (ref 0.00–0.07)
Basophils Absolute: 0 10*3/uL (ref 0.0–0.1)
Basophils Relative: 0 %
Eosinophils Absolute: 0.2 10*3/uL (ref 0.0–0.5)
Eosinophils Relative: 3 %
Immature Granulocytes: 1 %
Lymphocytes Relative: 22 %
Lymphs Abs: 2 10*3/uL (ref 0.7–4.0)
Monocytes Absolute: 0.8 10*3/uL (ref 0.1–1.0)
Monocytes Relative: 9 %
Neutro Abs: 5.8 10*3/uL (ref 1.7–7.7)
Neutrophils Relative %: 65 %

## 2019-12-30 LAB — COMPREHENSIVE METABOLIC PANEL
ALT: 61 U/L — ABNORMAL HIGH (ref 0–44)
AST: 41 U/L (ref 15–41)
Albumin: 3.8 g/dL (ref 3.5–5.0)
Alkaline Phosphatase: 84 U/L (ref 38–126)
Anion gap: 8 (ref 5–15)
BUN: 8 mg/dL (ref 6–20)
CO2: 25 mmol/L (ref 22–32)
Calcium: 9.1 mg/dL (ref 8.9–10.3)
Chloride: 105 mmol/L (ref 98–111)
Creatinine, Ser: 0.88 mg/dL (ref 0.61–1.24)
GFR calc Af Amer: >60 mL/min (ref >60)
GFR calc non Af Amer: >60 mL/min (ref >60)
Glucose, Bld: 96 mg/dL (ref 70–99)
Potassium: 3.8 mmol/L (ref 3.5–5.1)
Sodium: 138 mmol/L (ref 135–145)
Total Bilirubin: 0.6 mg/dL (ref 0.3–1.2)
Total Protein: 7.2 g/dL (ref 6.5–8.1)

## 2019-12-30 LAB — URINALYSIS, ROUTINE W REFLEX MICROSCOPIC
Bilirubin Urine: NEGATIVE
Glucose, UA: NEGATIVE mg/dL
Hgb urine dipstick: NEGATIVE
Ketones, ur: NEGATIVE mg/dL
Leukocytes,Ua: NEGATIVE
Nitrite: NEGATIVE
Protein, ur: NEGATIVE mg/dL
Specific Gravity, Urine: 1.006 (ref 1.005–1.030)
pH: 8 (ref 5.0–8.0)

## 2019-12-30 LAB — RAPID URINE DRUG SCREEN, HOSP PERFORMED
Amphetamines: NOT DETECTED
Barbiturates: NOT DETECTED
Benzodiazepines: NOT DETECTED
Cocaine: NOT DETECTED
Opiates: NOT DETECTED
Tetrahydrocannabinol: NOT DETECTED

## 2019-12-30 LAB — PROTIME-INR
INR: 1 (ref 0.8–1.2)
Prothrombin Time: 13.1 seconds (ref 11.4–15.2)

## 2019-12-30 LAB — ETHANOL: Alcohol, Ethyl (B): <10 mg/dL (ref <10)

## 2019-12-30 MED ORDER — NAPROXEN 500 MG PO TABS
500.0000 mg | ORAL_TABLET | Freq: Two times a day (BID) | ORAL | 0 refills | Status: DC
Start: 2019-12-30 — End: 2020-06-29

## 2019-12-30 NOTE — ED Notes (Addendum)
Pt ambulatory to room with steady gait

## 2019-12-30 NOTE — ED Provider Notes (Signed)
[facility] DEPARTMENT Provider Note   CSN: [id number] Arrival date & time: 12/29/19  2247     History Chief Complaint  Patient presents with  . Back Pain  . Numbness    Steve Henry is a 39 y.o. male.  HPI    39 year old with history of chronic back pain, degenerative spine disease and narcotic dependence comes in a chief complaint of back pain and numbness.  Patient reports that over the last 2 weeks he has had worsening pain in his neck followed by tingling sensation and weakness in his left arm.  He has also had episodes of numbness in his right hand.  He is now also limping because of weakness in his left leg.  Patient is unable to grip a cell phone with his left upper extremity given the weakness.  He has history of AC/DC in his cervical spine secondary to significant degenerative spine disease.  He denies any recent trauma.  In addition to that, patient is also having diffuse back pain.  Review of system is also positive for urinary incontinence.  Past Medical History:  Diagnosis Date  . Chronic back pain   . Chronic hand pain   . DDD (degenerative disc disease), cervical   . De Quervain's tenosynovitis   . Drug-seeking behavior   . Narcotic dependence [facility])     Patient Active Problem List   Diagnosis Date Noted  . Lipoma   . Numbness 09/21/2015    Past Surgical History:  Procedure Laterality Date  . MASS EXCISION N/A 03/13/2018   Procedure: EXCISION LIPOMA SCALP;  Surgeon: Aviva Signs, MD;  Location: [facility];  Service: General;  Laterality: N/A;  pt knows to arrive at 6:15  . SPINAL FUSION         History reviewed. No pertinent family history.  Social History   Tobacco Use  . Smoking status: Current Every Day Smoker    Packs/day: 0.50    Years: 16.00    Pack years: 8.00    Types: Cigarettes  . Smokeless tobacco: Never Used  Vaping Use  . Vaping Use: Never used  Substance Use Topics  . Alcohol use: No  . 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 Authorizing Provider  cloNIDine (CATAPRES) 0.1 MG tablet 1 tab po tid x 2 days, then bid x 2 days, then once daily x 2 days Patient not taking: Reported on 1/88/4166 0/63/01   Delora Fuel, MD  dicyclomine (BENTYL) 20 MG tablet Take 1 tablet (20 mg total) by mouth every 8 (eight) hours as needed for spasms. 12/04/19   Petrucelli, Samantha R, PA-C  hydrOXYzine (ATARAX/VISTARIL) 25 MG tablet Take 1 tablet (25 mg total) by mouth every 6 (six) hours as needed for anxiety. 12/04/19   Petrucelli, Samantha R, PA-C  loperamide (IMODIUM) 2 MG capsule Take 1 capsule (2 mg total) by mouth 4 (four) times daily as needed for diarrhea or loose stools. 12/04/19   Petrucelli, Samantha R, PA-C  methocarbamol (ROBAXIN) 500 MG tablet Take 1 tablet (500 mg total) by mouth every 8 (eight) hours as needed for muscle spasms. 12/04/19   Petrucelli, Samantha R, PA-C  naproxen (NAPROSYN) 500 MG tablet Take 1 tablet (500 mg total) by mouth 2 (two) times daily. 12/30/19   Varney Biles, MD  ondansetron (ZOFRAN ODT) 4 MG disintegrating tablet Take 1 tablet (4  mg total) by mouth every 8 (eight) hours as needed for nausea or vomiting. 12/04/19   Petrucelli, Samantha R, PA-C  FLUoxetine (PROZAC) 10 MG tablet Take 10 mg by mouth daily. Patient not taking: Reported on 12/04/2019  12/04/19  [provider]  pregabalin (LYRICA) 75 MG capsule Take 75 mg by mouth 2 (two) times daily. Patient not taking: Reported on 12/04/2019 01/30/18 12/04/19  [provider]  traZODone (DESYREL) 50 MG tablet Take 50 mg by mouth at bedtime. Patient not taking: Reported on 12/04/2019  12/04/19  [provider]    Allergies    Tramadol and Latex  Review of Systems   Review of Systems  Constitutional: Positive for activity change.  Respiratory: Negative for shortness of breath.   Cardiovascular:  Negative for chest pain.  Gastrointestinal: Negative for nausea and vomiting.  Musculoskeletal: Positive for back pain.  Neurological: Positive for weakness and numbness.  All other systems reviewed and are negative.   Physical Exam Updated Vital Signs BP 121/86   Pulse 78   Temp 97.8 F (36.6 C)   Resp 12   SpO2 100%   Physical Exam Vitals reviewed.  Constitutional:      Appearance: He is well-developed.  HENT:     Head: Normocephalic and atraumatic.  Eyes:     Pupils: Pupils are equal, round, and reactive to light.  Neck:     Vascular: No JVD.  Cardiovascular:     Rate and Rhythm: Normal rate and regular rhythm.  Pulmonary:     Effort: Pulmonary effort is normal. No respiratory distress.     Breath sounds: Normal breath sounds. No wheezing.  Abdominal:     General: Bowel sounds are normal. There is no distension.     Palpations: Abdomen is soft.     Tenderness: There is no abdominal tenderness. There is no guarding or rebound.  Musculoskeletal:     Cervical back: Normal range of motion and neck supple.     Comments: Pt has tenderness over the cervical, lumbar and thoracic spine. No step offs, no erythema. Pt has 2+ patellar reflex bilaterally. Able to discriminate between sharp and dull. Able to ambulate   Skin:    General: Skin is warm and dry.  Neurological:     Mental Status: He is alert and oriented to person, place, and time.     Cranial Nerves: No cranial nerve deficit.     Coordination: Coordination normal.     Comments: Patient has subjective numbness in the left upper and lower extremity. Motor strength on the left side is 3+ out of 5 for upper and lower extremity. Patient is grip strength is weaker on the left side.     ED Results / Procedures / Treatments   Labs (all labs ordered are listed, but only abnormal results are displayed) Labs Reviewed  COMPREHENSIVE METABOLIC PANEL - Abnormal; Notable for the following components:      Result Value     ALT 61 (*)    All other components within normal limits  URINALYSIS, ROUTINE W REFLEX MICROSCOPIC - Abnormal; Notable for the following components:   Color, Urine STRAW (*)    All other components within normal limits  ETHANOL  PROTIME-INR  APTT  CBC  DIFFERENTIAL  RAPID URINE DRUG SCREEN, HOSP PERFORMED    EKG None  Radiology CT HEAD WO CONTRAST  Result Date: 12/30/2019 CLINICAL DATA:  Ataxia, stroke suspected, focal neuro deficit, greater than 6 hours. Additional history provided: Ataxia; left-sided  weakness for 2 days. EXAM: CT HEAD WITHOUT CONTRAST TECHNIQUE: Contiguous axial images were obtained from the base of the skull through the vertex without intravenous contrast. COMPARISON:  Prior head CT 09/20/2015. FINDINGS: Brain: Cerebral volume is normal. There is no acute intracranial hemorrhage. No demarcated cortical infarct. No extra-axial fluid collection. No evidence of intracranial mass. No midline shift. Vascular: No hyperdense vessel. Skull: Normal. Negative for fracture or focal lesion. Sinuses/Orbits: Visualized orbits show no acute finding. Mild ethmoid, sphenoid and maxillary sinus mucosal thickening at the imaged levels. No significant mastoid effusion at the imaged levels. IMPRESSION: Unremarkable non-contrast CT appearance of the brain. No evidence of acute intracranial abnormality. Mild ethmoid, sphenoid and maxillary sinus mucosal thickening at the imaged levels. Electronically Signed   By: Kellie Simmering DO   On: 12/30/2019 09:49   MR BRAIN WO CONTRAST  Result Date: 12/30/2019 CLINICAL DATA:  Left hand numbness EXAM: MRI HEAD WITHOUT CONTRAST TECHNIQUE: Multiplanar, multiecho pulse sequences of the brain and surrounding structures were obtained without intravenous contrast. COMPARISON:  None. FINDINGS: Brain: There is no acute infarction or intracranial hemorrhage. There is no intracranial mass, mass effect, or edema. There is no hydrocephalus or extra-axial fluid  collection. Ventricles and sulci are normal in size and configuration. Vascular: Major vessel flow voids at the skull base are preserved. Skull and upper cervical spine: Normal marrow signal is preserved. Sinuses/Orbits: Mild mucosal thickening.  Orbits are unremarkable. Other: Sella is unremarkable.  Mastoid air cells are clear. IMPRESSION: No evidence of recent infarction, hemorrhage, or mass. Electronically Signed   By: Macy Mis M.D.   On: 12/30/2019 12:21   MR Cervical Spine Wo Contrast  Result Date: 12/30/2019 CLINICAL DATA:  Neck pain and left hand numbness for 2 weeks. Mid back pain and low back pain. EXAM: MRI CERVICAL, THORACIC AND LUMBAR SPINE WITHOUT CONTRAST TECHNIQUE: Multiplanar and multiecho pulse sequences of the cervical spine, to include the craniocervical junction and cervicothoracic junction, and thoracic and lumbar spine, were obtained without intravenous contrast. COMPARISON:  MRI 09/21/2015, 12/19/2017 FINDINGS: Technical note: Examination is mildly degraded by motion artifact. MRI CERVICAL SPINE FINDINGS Alignment: Straightening of the cervical lordosis without static listhesis. Vertebrae: Patient is status post C4-C7 ACDF. No evidence of acute fracture, discitis, or bone lesion. Cord: No intrinsic cord signal abnormality identified. Normal cord morphology. Posterior Fossa, vertebral arteries, paraspinal tissues: Negative. Disc levels: C2-C3: No significant disc protrusion, foraminal stenosis, or canal stenosis. C3-C4: Posterior disc osteophyte complex with bilateral uncovertebral arthropathy resulting in severe left and moderate to severe right foraminal stenosis with mild canal stenosis. No significant interval progression from prior. C4-C5: Prior discectomy and fusion.  No foraminal or canal stenosis. C5-C6: Prior discectomy and fusion. Central endplate spur contacts the ventral cord with mild canal stenosis. No foraminal stenosis. Unchanged. C6-C7: Prior discectomy and fusion.  Central endplate spur contacts the ventral cord with mild canal stenosis. No foraminal stenosis. Unchanged. C7-T1: Minimal disc osteophyte complex with bilateral facet hypertrophy resulting in mild bilateral foraminal stenosis without canal stenosis. Unchanged. MRI THORACIC SPINE FINDINGS Alignment:  Physiologic. Vertebrae: No acute fracture. No evidence of discitis. No suspicious bone lesion. Incidental 1.3 cm T1/T2 hyperintense intraosseous hemangioma within the posterior aspect the T9 vertebral body. Cord:  No intrinsic cord signal abnormality identified. Paraspinal and other soft tissues: Negative. Disc levels: Thoracic vertebral body heights are maintained. No significant disc protrusion at any level. No foraminal or canal stenosis throughout the thoracic spine. MRI LUMBAR SPINE FINDINGS Segmentation: Transitional lumbosacral anatomy  with lumbarization of the S1 segment including a relatively well developed disc space at S1-S2. Numbering convention is in keeping with the previous report of 09/21/2015. Alignment:  Physiologic. Vertebrae:  No fracture, evidence of discitis, or bone lesion. Conus medullaris and cauda equina: Conus extends to the L1 level. Conus and cauda equina appear normal. Paraspinal and other soft tissues: Visualized urinary bladder appears moderately distended. Large volume of stool within the visualized rectosigmoid colon. Disc levels: T12-L1: Sagittal sequences only. No significant disc protrusion, foraminal stenosis, or canal stenosis. L1-L2: Sagittal sequences only. No significant disc protrusion, foraminal stenosis, or canal stenosis. L2-L3: Disc desiccation with mild diffuse disc bulge resulting in mild canal stenosis without foraminal stenosis. Findings progressed from prior. L3-L4: No significant disc protrusion, foraminal stenosis, or canal stenosis. L4-L5: No significant disc protrusion, foraminal stenosis, or canal stenosis. L5-S1: Disc desiccation with left paracentral disc  protrusion. Mild bilateral facet arthropathy. Similar degree of mild left subarticular recess stenosis with abutment of the descending left S1 nerve root. Mild left foraminal stenosis. No canal stenosis. No significant interval progression from prior. IMPRESSION: CERVICAL SPINE: 1. No acute findings. 2. No significant interval progression of degenerative changes at the C3-4 level where there is severe left and moderate-to-severe right foraminal stenosis as well as mild canal stenosis. 3. Prior C4-C7 ACDF with mild canal stenosis at the C5-6 and C6-7 levels, similar to prior. THORACIC SPINE: 1. No acute findings. 2. No foraminal or canal stenosis at any level. LUMBAR SPINE: 1. No acute findings. 2. Slight interval progression of degenerative changes at the L2-L3 level where there is now mild canal stenosis. 3. L5-S1 left paracentral disc protrusion with mild left subarticular recess stenosis and abutment of the descending left S1 nerve root. Mild left foraminal stenosis. 4. Transitional lumbosacral anatomy with lumbarization of the S1 segment including a relatively well developed disc space at S1-S2. Electronically Signed   By: Davina Poke D.O.   On: 12/30/2019 12:23   MR THORACIC SPINE WO CONTRAST  Result Date: 12/30/2019 CLINICAL DATA:  Neck pain and left hand numbness for 2 weeks. Mid back pain and low back pain. EXAM: MRI CERVICAL, THORACIC AND LUMBAR SPINE WITHOUT CONTRAST TECHNIQUE: Multiplanar and multiecho pulse sequences of the cervical spine, to include the craniocervical junction and cervicothoracic junction, and thoracic and lumbar spine, were obtained without intravenous contrast. COMPARISON:  MRI 09/21/2015, 12/19/2017 FINDINGS: Technical note: Examination is mildly degraded by motion artifact. MRI CERVICAL SPINE FINDINGS Alignment: Straightening of the cervical lordosis without static listhesis. Vertebrae: Patient is status post C4-C7 ACDF. No evidence of acute fracture, discitis, or bone  lesion. Cord: No intrinsic cord signal abnormality identified. Normal cord morphology. Posterior Fossa, vertebral arteries, paraspinal tissues: Negative. Disc levels: C2-C3: No significant disc protrusion, foraminal stenosis, or canal stenosis. C3-C4: Posterior disc osteophyte complex with bilateral uncovertebral arthropathy resulting in severe left and moderate to severe right foraminal stenosis with mild canal stenosis. No significant interval progression from prior. C4-C5: Prior discectomy and fusion.  No foraminal or canal stenosis. C5-C6: Prior discectomy and fusion. Central endplate spur contacts the ventral cord with mild canal stenosis. No foraminal stenosis. Unchanged. C6-C7: Prior discectomy and fusion. Central endplate spur contacts the ventral cord with mild canal stenosis. No foraminal stenosis. Unchanged. C7-T1: Minimal disc osteophyte complex with bilateral facet hypertrophy resulting in mild bilateral foraminal stenosis without canal stenosis. Unchanged. MRI THORACIC SPINE FINDINGS Alignment:  Physiologic. Vertebrae: No acute fracture. No evidence of discitis. No suspicious bone lesion. Incidental 1.3 cm T1/T2  hyperintense intraosseous hemangioma within the posterior aspect the T9 vertebral body. Cord:  No intrinsic cord signal abnormality identified. Paraspinal and other soft tissues: Negative. Disc levels: Thoracic vertebral body heights are maintained. No significant disc protrusion at any level. No foraminal or canal stenosis throughout the thoracic spine. MRI LUMBAR SPINE FINDINGS Segmentation: Transitional lumbosacral anatomy with lumbarization of the S1 segment including a relatively well developed disc space at S1-S2. Numbering convention is in keeping with the previous report of 09/21/2015. Alignment:  Physiologic. Vertebrae:  No fracture, evidence of discitis, or bone lesion. Conus medullaris and cauda equina: Conus extends to the L1 level. Conus and cauda equina appear normal. Paraspinal  and other soft tissues: Visualized urinary bladder appears moderately distended. Large volume of stool within the visualized rectosigmoid colon. Disc levels: T12-L1: Sagittal sequences only. No significant disc protrusion, foraminal stenosis, or canal stenosis. L1-L2: Sagittal sequences only. No significant disc protrusion, foraminal stenosis, or canal stenosis. L2-L3: Disc desiccation with mild diffuse disc bulge resulting in mild canal stenosis without foraminal stenosis. Findings progressed from prior. L3-L4: No significant disc protrusion, foraminal stenosis, or canal stenosis. L4-L5: No significant disc protrusion, foraminal stenosis, or canal stenosis. L5-S1: Disc desiccation with left paracentral disc protrusion. Mild bilateral facet arthropathy. Similar degree of mild left subarticular recess stenosis with abutment of the descending left S1 nerve root. Mild left foraminal stenosis. No canal stenosis. No significant interval progression from prior. IMPRESSION: CERVICAL SPINE: 1. No acute findings. 2. No significant interval progression of degenerative changes at the C3-4 level where there is severe left and moderate-to-severe right foraminal stenosis as well as mild canal stenosis. 3. Prior C4-C7 ACDF with mild canal stenosis at the C5-6 and C6-7 levels, similar to prior. THORACIC SPINE: 1. No acute findings. 2. No foraminal or canal stenosis at any level. LUMBAR SPINE: 1. No acute findings. 2. Slight interval progression of degenerative changes at the L2-L3 level where there is now mild canal stenosis. 3. L5-S1 left paracentral disc protrusion with mild left subarticular recess stenosis and abutment of the descending left S1 nerve root. Mild left foraminal stenosis. 4. Transitional lumbosacral anatomy with lumbarization of the S1 segment including a relatively well developed disc space at S1-S2. Electronically Signed   By: Davina Poke D.O.   On: 12/30/2019 12:23   MR LUMBAR SPINE WO CONTRAST  Result  Date: 12/30/2019 CLINICAL DATA:  Neck pain and left hand numbness for 2 weeks. Mid back pain and low back pain. EXAM: MRI CERVICAL, THORACIC AND LUMBAR SPINE WITHOUT CONTRAST TECHNIQUE: Multiplanar and multiecho pulse sequences of the cervical spine, to include the craniocervical junction and cervicothoracic junction, and thoracic and lumbar spine, were obtained without intravenous contrast. COMPARISON:  MRI 09/21/2015, 12/19/2017 FINDINGS: Technical note: Examination is mildly degraded by motion artifact. MRI CERVICAL SPINE FINDINGS Alignment: Straightening of the cervical lordosis without static listhesis. Vertebrae: Patient is status post C4-C7 ACDF. No evidence of acute fracture, discitis, or bone lesion. Cord: No intrinsic cord signal abnormality identified. Normal cord morphology. Posterior Fossa, vertebral arteries, paraspinal tissues: Negative. Disc levels: C2-C3: No significant disc protrusion, foraminal stenosis, or canal stenosis. C3-C4: Posterior disc osteophyte complex with bilateral uncovertebral arthropathy resulting in severe left and moderate to severe right foraminal stenosis with mild canal stenosis. No significant interval progression from prior. C4-C5: Prior discectomy and fusion.  No foraminal or canal stenosis. C5-C6: Prior discectomy and fusion. Central endplate spur contacts the ventral cord with mild canal stenosis. No foraminal stenosis. Unchanged. C6-C7: Prior discectomy and fusion.  Central endplate spur contacts the ventral cord with mild canal stenosis. No foraminal stenosis. Unchanged. C7-T1: Minimal disc osteophyte complex with bilateral facet hypertrophy resulting in mild bilateral foraminal stenosis without canal stenosis. Unchanged. MRI THORACIC SPINE FINDINGS Alignment:  Physiologic. Vertebrae: No acute fracture. No evidence of discitis. No suspicious bone lesion. Incidental 1.3 cm T1/T2 hyperintense intraosseous hemangioma within the posterior aspect the T9 vertebral body. Cord:   No intrinsic cord signal abnormality identified. Paraspinal and other soft tissues: Negative. Disc levels: Thoracic vertebral body heights are maintained. No significant disc protrusion at any level. No foraminal or canal stenosis throughout the thoracic spine. MRI LUMBAR SPINE FINDINGS Segmentation: Transitional lumbosacral anatomy with lumbarization of the S1 segment including a relatively well developed disc space at S1-S2. Numbering convention is in keeping with the previous report of 09/21/2015. Alignment:  Physiologic. Vertebrae:  No fracture, evidence of discitis, or bone lesion. Conus medullaris and cauda equina: Conus extends to the L1 level. Conus and cauda equina appear normal. Paraspinal and other soft tissues: Visualized urinary bladder appears moderately distended. Large volume of stool within the visualized rectosigmoid colon. Disc levels: T12-L1: Sagittal sequences only. No significant disc protrusion, foraminal stenosis, or canal stenosis. L1-L2: Sagittal sequences only. No significant disc protrusion, foraminal stenosis, or canal stenosis. L2-L3: Disc desiccation with mild diffuse disc bulge resulting in mild canal stenosis without foraminal stenosis. Findings progressed from prior. L3-L4: No significant disc protrusion, foraminal stenosis, or canal stenosis. L4-L5: No significant disc protrusion, foraminal stenosis, or canal stenosis. L5-S1: Disc desiccation with left paracentral disc protrusion. Mild bilateral facet arthropathy. Similar degree of mild left subarticular recess stenosis with abutment of the descending left S1 nerve root. Mild left foraminal stenosis. No canal stenosis. No significant interval progression from prior. IMPRESSION: CERVICAL SPINE: 1. No acute findings. 2. No significant interval progression of degenerative changes at the C3-4 level where there is severe left and moderate-to-severe right foraminal stenosis as well as mild canal stenosis. 3. Prior C4-C7 ACDF with mild  canal stenosis at the C5-6 and C6-7 levels, similar to prior. THORACIC SPINE: 1. No acute findings. 2. No foraminal or canal stenosis at any level. LUMBAR SPINE: 1. No acute findings. 2. Slight interval progression of degenerative changes at the L2-L3 level where there is now mild canal stenosis. 3. L5-S1 left paracentral disc protrusion with mild left subarticular recess stenosis and abutment of the descending left S1 nerve root. Mild left foraminal stenosis. 4. Transitional lumbosacral anatomy with lumbarization of the S1 segment including a relatively well developed disc space at S1-S2. Electronically Signed   By: Davina Poke D.O.   On: 12/30/2019 12:23    Procedures Procedures (including critical care time)  Medications Ordered in ED Medications - No data to display  ED Course  I have reviewed the triage vital signs and the nursing notes.  Pertinent labs & imaging results that were available during my care of the patient were reviewed by me and considered in my medical decision making (see chart for details).    MDM Rules/Calculators/A&P                          39 year old male comes in a chief complaint of left-sided weakness, numbness and back pain. He has known history of cervical spine disease and has had instrumentation there 2 years ago.  Patient has chronic pain issue along with narcotic dependence.  He denies IV drug use history.  Review of system is positive for  numbness, weakness on the left side along with intermittent episode of numbness in the right hand and also urinary incontinence.  He has tenderness diffusely over his spine.  Strength is noted to be diminished for upper and lower extremity and there is subjective sensory deficits of the left side as well.  Given the findings we have, it is hard to ascertain the exact cause for his symptoms.  Initially the plan was to get a CT scan of his brain and then MRI of his cervical through lumbar spine to rule out cord  compression, herniated disc, severe impingement of the nerve.  Spoke with neurology and decided to add MRI brain.  Work-up in the ER is reassuring.  I did discuss the case with neurosurgery and Vinny Costella call me back and informed me that they have reviewed the MRI and it does not show any emergent pathology.  Outpatient follow-up is advised.  The patient appears reasonably screened and/or stabilized for discharge and I doubt any other medical condition or other [facility] requiring further screening, evaluation, or treatment in the ED at this time prior to discharge.   Results from the ER workup discussed with the patient face to face and all questions answered to the best of my ability. The patient is safe for discharge with strict return precautions.   Final Clinical Impression(s) / ED Diagnoses Final diagnoses:  Cervical spinal stenosis    Rx / DC Orders ED Discharge Orders         Ordered    naproxen (NAPROSYN) 500 MG tablet  2 times daily     Discontinue  Reprint     12/30/19 1301           Varney Biles, MD 12/30/19 1314

## 2019-12-30 NOTE — Discharge Instructions (Signed)
Imaging results are reassuring. Take anti-inflammatory medications prescribed. Follow up with your spine surgeon (ideally) or [facility] neurosurgeon whose information is provided.

## 2019-12-30 NOTE — ED Notes (Signed)
Patient transported to CT 

## 2020-02-09 MED FILL — Methocarbamol Tab 500 MG: ORAL | Qty: 500 | Status: AC

## 2020-02-09 MED FILL — Dicyclomine HCl Cap 10 MG: ORAL | Qty: 10 | Status: AC

## 2020-02-09 MED FILL — Ondansetron Orally Disintegrating Tab 4 MG: ORAL | Qty: 4 | Status: AC

## 2020-02-09 MED FILL — Hydroxyzine HCl Tab 25 MG: ORAL | Qty: 25 | Status: AC

## 2020-06-28 ENCOUNTER — Emergency Department (HOSPITAL_COMMUNITY)
Admission: EM | Admit: 2020-06-28 | Discharge: 2020-06-29 | Disposition: A | Discharge status: Other Acute Inpatient Hospital | Payer: Medicaid Other | Attending: Emergency Medicine | Admitting: Emergency Medicine

## 2020-06-28 ENCOUNTER — Encounter (HOSPITAL_COMMUNITY): Payer: Self-pay

## 2020-06-28 ENCOUNTER — Other Ambulatory Visit: Payer: Self-pay

## 2020-06-28 DIAGNOSIS — R6883 Chills (without fever): Secondary | ICD-10-CM | POA: Diagnosis not present

## 2020-06-28 DIAGNOSIS — F1994 Other psychoactive substance use, unspecified with psychoactive substance-induced mood disorder: Secondary | ICD-10-CM | POA: Diagnosis not present

## 2020-06-28 DIAGNOSIS — F17210 Nicotine dependence, cigarettes, uncomplicated: Secondary | ICD-10-CM | POA: Diagnosis not present

## 2020-06-28 DIAGNOSIS — F1193 Opioid use, unspecified with withdrawal: Secondary | ICD-10-CM

## 2020-06-28 DIAGNOSIS — R197 Diarrhea, unspecified: Secondary | ICD-10-CM | POA: Diagnosis not present

## 2020-06-28 DIAGNOSIS — Z20822 Contact with and (suspected) exposure to covid-19: Secondary | ICD-10-CM | POA: Insufficient documentation

## 2020-06-28 DIAGNOSIS — Z91040 Latex allergy status: Secondary | ICD-10-CM | POA: Insufficient documentation

## 2020-06-28 DIAGNOSIS — F1123 Opioid dependence with withdrawal: Secondary | ICD-10-CM | POA: Diagnosis present

## 2020-06-28 DIAGNOSIS — R109 Unspecified abdominal pain: Secondary | ICD-10-CM | POA: Diagnosis not present

## 2020-06-28 DIAGNOSIS — M791 Myalgia, unspecified site: Secondary | ICD-10-CM | POA: Insufficient documentation

## 2020-06-28 DIAGNOSIS — R112 Nausea with vomiting, unspecified: Secondary | ICD-10-CM | POA: Diagnosis not present

## 2020-06-28 DIAGNOSIS — R45851 Suicidal ideations: Secondary | ICD-10-CM | POA: Diagnosis not present

## 2020-06-28 LAB — CBC
HCT: 46.4 % (ref 39.0–52.0)
Hemoglobin: 15.3 g/dL (ref 13.0–17.0)
MCH: 28 pg (ref 26.0–34.0)
MCHC: 33 g/dL (ref 30.0–36.0)
MCV: 85 fL (ref 80.0–100.0)
Platelets: 395 10*3/uL (ref 150–400)
RBC: 5.46 MIL/uL (ref 4.22–5.81)
RDW: 12.1 % (ref 11.5–15.5)
WBC: 6.7 10*3/uL (ref 4.0–10.5)
nRBC: 0 % (ref 0.0–0.2)

## 2020-06-28 LAB — COMPREHENSIVE METABOLIC PANEL
ALT: 13 U/L (ref 0–44)
AST: 17 U/L (ref 15–41)
Albumin: 4.4 g/dL (ref 3.5–5.0)
Alkaline Phosphatase: 107 U/L (ref 38–126)
Anion gap: 11 (ref 5–15)
BUN: 17 mg/dL (ref 6–20)
CO2: 26 mmol/L (ref 22–32)
Calcium: 9.4 mg/dL (ref 8.9–10.3)
Chloride: 99 mmol/L (ref 98–111)
Creatinine, Ser: 1.03 mg/dL (ref 0.61–1.24)
GFR, Estimated: >60 mL/min (ref >60)
Glucose, Bld: 108 mg/dL — ABNORMAL HIGH (ref 70–99)
Potassium: 4.1 mmol/L (ref 3.5–5.1)
Sodium: 136 mmol/L (ref 135–145)
Total Bilirubin: 0.7 mg/dL (ref 0.3–1.2)
Total Protein: 8.4 g/dL — ABNORMAL HIGH (ref 6.5–8.1)

## 2020-06-28 LAB — RESP PANEL BY RT-PCR (FLU A&B, COVID) ARPGX2
Influenza A by PCR: NEGATIVE
Influenza B by PCR: NEGATIVE
SARS Coronavirus 2 by RT PCR: NEGATIVE

## 2020-06-28 LAB — RAPID URINE DRUG SCREEN, HOSP PERFORMED
Amphetamines: POSITIVE — AB
Barbiturates: NOT DETECTED
Benzodiazepines: NOT DETECTED
Cocaine: POSITIVE — AB
Opiates: POSITIVE — AB
Tetrahydrocannabinol: NOT DETECTED

## 2020-06-28 LAB — ACETAMINOPHEN LEVEL: Acetaminophen (Tylenol), Serum: <10 ug/mL — ABNORMAL LOW (ref 10–30)

## 2020-06-28 LAB — SALICYLATE LEVEL: Salicylate Lvl: <7 mg/dL — ABNORMAL LOW (ref 7.0–30.0)

## 2020-06-28 LAB — ETHANOL: Alcohol, Ethyl (B): <10 mg/dL (ref <10)

## 2020-06-28 MED ORDER — NAPROXEN 500 MG PO TABS: 500.0000 mg | ORAL_TABLET | Freq: Two times a day (BID) | ORAL | Status: DC | PRN

## 2020-06-28 MED ORDER — ACETAMINOPHEN 325 MG PO TABS: 650.0000 mg | ORAL_TABLET | ORAL | Status: DC | PRN

## 2020-06-28 MED ORDER — METHOCARBAMOL 1000 MG/10ML IJ SOLN: 1000.0000 mg | Freq: Once | INTRAMUSCULAR | Status: DC

## 2020-06-28 MED ORDER — ONDANSETRON HCL 4 MG/2ML IJ SOLN
4.0000 mg | Freq: Once | INTRAMUSCULAR | Status: AC
  Administered 2020-06-28: 4 mg via INTRAVENOUS
  Filled 2020-06-28: qty 2

## 2020-06-28 MED ORDER — HYDROXYZINE HCL 25 MG PO TABS: 25.0000 mg | ORAL_TABLET | Freq: Four times a day (QID) | ORAL | Status: DC | PRN

## 2020-06-28 MED ORDER — KETOROLAC TROMETHAMINE 30 MG/ML IJ SOLN
30.0000 mg | Freq: Once | INTRAMUSCULAR | Status: AC
  Administered 2020-06-28: 30 mg via INTRAVENOUS
  Filled 2020-06-28: qty 1

## 2020-06-28 MED ORDER — PROMETHAZINE HCL 25 MG/ML IJ SOLN
25.0000 mg | Freq: Once | INTRAMUSCULAR | Status: AC
  Administered 2020-06-28: 25 mg via INTRAVENOUS
  Filled 2020-06-28: qty 1

## 2020-06-28 MED ORDER — METHOCARBAMOL 1000 MG/10ML IJ SOLN
1000.0000 mg | Freq: Once | INTRAVENOUS | Status: AC
  Administered 2020-06-28: 1000 mg via INTRAVENOUS
  Filled 2020-06-28: qty 1000

## 2020-06-28 MED ORDER — DICYCLOMINE HCL 10 MG/ML IM SOLN
20.0000 mg | Freq: Once | INTRAMUSCULAR | Status: AC
  Administered 2020-06-28: 20 mg via INTRAMUSCULAR
  Filled 2020-06-28: qty 2

## 2020-06-28 MED ORDER — METHOCARBAMOL 500 MG PO TABS: 1000.0000 mg | ORAL_TABLET | Freq: Four times a day (QID) | ORAL | Status: DC | PRN

## 2020-06-28 MED ORDER — DICYCLOMINE HCL 20 MG PO TABS: 20.0000 mg | ORAL_TABLET | Freq: Four times a day (QID) | ORAL | Status: DC

## 2020-06-28 MED ORDER — LOPERAMIDE HCL 2 MG PO CAPS
4.0000 mg | ORAL_CAPSULE | Freq: Once | ORAL | Status: AC
  Administered 2020-06-28: 4 mg via ORAL
  Filled 2020-06-28: qty 2

## 2020-06-28 MED ORDER — SODIUM CHLORIDE 0.9 % IV BOLUS
1000.0000 mL | Freq: Once | INTRAVENOUS | Status: AC
  Administered 2020-06-28: 1000 mL via INTRAVENOUS

## 2020-06-28 MED ORDER — ONDANSETRON 4 MG PO TBDP: 4.0000 mg | ORAL_TABLET | Freq: Four times a day (QID) | ORAL | Status: DC | PRN

## 2020-06-28 MED ORDER — LOPERAMIDE HCL 2 MG PO CAPS: 2.0000 mg | ORAL_CAPSULE | ORAL | Status: DC | PRN

## 2020-06-28 NOTE — ED Notes (Signed)
Pt given a sandwich, soda, and water.

## 2020-06-28 NOTE — ED Triage Notes (Addendum)
Patient c/o chills, abdominal pain, diarrhea, and vomiting that started today. 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 Patient states last use was 2 days ago.  Patient added that he was suicidal with no plan. Patient denies HI, visual or auditory hallucinations.

## 2020-06-28 NOTE — ED Notes (Signed)
Pt provided with a sprite. 

## 2020-06-28 NOTE — ED Provider Notes (Signed)
[facility] DEPT Provider Note   CSN: [id number] Arrival date & time: 06/28/20  1847     History Chief Complaint  Patient presents with  . Emesis  . Abdominal Pain  . Chills  . Diarrhea  . Suicidal    Steve Henry is a 40 y.o. male.  HPI     40yo male with history of narcotic dependence presents with concern for withdrawal from fentanyl and heroin with nausea, vomiting, diarrhea, abdominal pain and chills.    Last used 2 days ago and symptoms began today.  Reports 3 episodes each of vomiting and diarrhea.  Reports chills, body aches, diffuse abdominal pain.  Denies IV drug use.  Denies fevers.  Denies known COVID-19 exposures.  He reports he does have suicidal ideation but no plan. He is the father of a 63 and 94 year old. He would like help for his SI and rehab.    Past Medical History:  Diagnosis Date  . Chronic back pain   . Chronic hand pain   . DDD (degenerative disc disease), cervical   . De Quervain's tenosynovitis   . Drug-seeking behavior   . Narcotic dependence [facility])     Patient Active Problem List   Diagnosis Date Noted  . Lipoma   . Numbness 09/21/2015    Past Surgical History:  Procedure Laterality Date  . MASS EXCISION N/A 03/13/2018   Procedure: EXCISION LIPOMA SCALP;  Surgeon: Aviva Signs, MD;  Location: [facility];  Service: General;  Laterality: N/A;  pt knows to arrive at 6:15  . SPINAL FUSION         Family History  Family history unknown: Yes    Social History   Tobacco Use  . Smoking status: Current Every Day Smoker    Packs/day: 0.50    Years: 16.00    Pack years: 8.00    Types: Cigarettes  . Smokeless tobacco: Never Used  Vaping Use  . Vaping Use: Never used  Substance Use Topics  . Alcohol use: No  . Drug use: Yes    Comment: heroin and Fentanyl    Home Medications Prior to Admission medications   Medication Sig Start Date End Date Taking? Authorizing Provider  cloNIDine (CATAPRES) 0.1  MG tablet 1 tab po tid x 2 days, then bid x 2 days, then once daily x 2 days Patient not taking: Reported on [id number]   Delora Fuel, MD  dicyclomine (BENTYL) 20 MG tablet Take 1 tablet (20 mg total) by mouth every 8 (eight) hours as needed for spasms. 12/04/19   Petrucelli, Samantha R, PA-C  hydrOXYzine (ATARAX/VISTARIL) 25 MG tablet Take 1 tablet (25 mg total) by mouth every 6 (six) hours as needed for anxiety. 12/04/19   Petrucelli, Samantha R, PA-C  loperamide (IMODIUM) 2 MG capsule Take 1 capsule (2 mg total) by mouth 4 (four) times daily as needed for diarrhea or loose stools. 12/04/19   Petrucelli, Samantha R, PA-C  methocarbamol (ROBAXIN) 500 MG tablet Take 1 tablet (500 mg total) by mouth every 8 (eight) hours as needed for muscle spasms. 12/04/19   Petrucelli, Samantha R, PA-C  naproxen (NAPROSYN) 500 MG tablet Take 1 tablet (500 mg total) by mouth 2 (two) times daily. 12/30/19   Varney Biles, MD  ondansetron (ZOFRAN ODT) 4 MG disintegrating tablet Take 1 tablet (4 mg total) by mouth every 8 (eight) hours as needed for nausea or vomiting. 12/04/19   Petrucelli, Samantha R, PA-C  FLUoxetine (PROZAC) 10  MG tablet Take 10 mg by mouth daily. Patient not taking: Reported on 12/04/2019  12/04/19  [provider]  pregabalin (LYRICA) 75 MG capsule Take 75 mg by mouth 2 (two) times daily. Patient not taking: Reported on 12/04/2019 01/30/18 12/04/19  [provider]  traZODone (DESYREL) 50 MG tablet Take 50 mg by mouth at bedtime. Patient not taking: Reported on 12/04/2019  12/04/19  [provider]    Allergies    Tramadol and Latex  Review of Systems   Review of Systems  Physical Exam Updated Vital Signs BP (!) 149/84   Pulse 98   Temp 98.3 F (36.8 C) (Oral)   Resp 18   Ht 5\' 3"  (1.6 m)   Wt 61.2 kg   SpO2 99%   BMI 23.91 kg/m   Physical Exam  ED Results / Procedures / Treatments   Labs (all labs ordered are listed, but only abnormal results are  displayed) Labs Reviewed  COMPREHENSIVE METABOLIC PANEL - Abnormal; Notable for the following components:      Result Value   Glucose, Bld 108 (*)    Total Protein 8.4 (*)    All other components within normal limits  SALICYLATE LEVEL - Abnormal; Notable for the following components:   Salicylate Lvl <5.5 (*)    All other components within normal limits  ACETAMINOPHEN LEVEL - Abnormal; Notable for the following components:   Acetaminophen (Tylenol), Serum <10 (*)    All other components within normal limits  RAPID URINE DRUG SCREEN, HOSP PERFORMED - Abnormal; Notable for the following components:   Opiates POSITIVE (*)    Cocaine POSITIVE (*)    Amphetamines POSITIVE (*)    All other components within normal limits  RESP PANEL BY RT-PCR (FLU A&B, COVID) ARPGX2  ETHANOL  CBC    EKG None  Radiology No results found.  Procedures Procedures (including critical care time)  Medications Ordered in ED Medications  dicyclomine (BENTYL) tablet 20 mg (has no administration in time range)  hydrOXYzine (ATARAX/VISTARIL) tablet 25 mg (has no administration in time range)  loperamide (IMODIUM) capsule 2-4 mg (has no administration in time range)  methocarbamol (ROBAXIN) tablet 1,000 mg (has no administration in time range)  naproxen (NAPROSYN) tablet 500 mg (has no administration in time range)  ondansetron (ZOFRAN-ODT) disintegrating tablet 4 mg (has no administration in time range)  acetaminophen (TYLENOL) tablet 650 mg (has no administration in time range)  sodium chloride 0.9 % bolus 1,000 mL (1,000 mLs Intravenous New Bag/Given 06/28/20 2034)  ondansetron (ZOFRAN) injection 4 mg (4 mg Intravenous Given 06/28/20 2035)  loperamide (IMODIUM) capsule 4 mg (4 mg Oral Given 06/28/20 2035)  dicyclomine (BENTYL) injection 20 mg (20 mg Intramuscular Given 06/28/20 2035)  promethazine (PHENERGAN) injection 25 mg (25 mg Intravenous Given 06/28/20 2158)  ketorolac (TORADOL) 30 MG/ML injection 30  mg (30 mg Intravenous Given 06/28/20 2158)  methocarbamol (ROBAXIN) 1,000 mg in dextrose 5 % 100 mL IVPB (1,000 mg Intravenous New Bag/Given 06/28/20 2158)    ED Course  I have reviewed the triage vital signs and the nursing notes.  Pertinent labs & imaging results that were available during my care of the patient were reviewed by me and considered in my medical decision making (see chart for details).    MDM Rules/Calculators/A&P                           40yo male with history of narcotic dependence presents  with concern for withdrawal from fentanyl and heroin with nausea, vomiting, diarrhea, abdominal pain and chills and passive suicidal ideation.    He is voluntary. Symptoms consistent with opiate withdrawal or possible viral syndrome.  COVID 19 testing pending.  Labs without acute abnormalities. Tachycardia improved with hydration.  He is medically cleared.  Ordered medications to help with opiate withdrawal symptoms. Awaiting TTS consultation for possible inpatient treatment.    Final Clinical Impression(s) / ED Diagnoses Final diagnoses:  Opiate withdrawal (HCC)  Nausea vomiting and diarrhea  Suicidal ideation    Rx / DC Orders ED Discharge Orders    None       Gareth Morgan, MD 06/28/20 2320

## 2020-06-29 ENCOUNTER — Ambulatory Visit: Payer: Self-pay | Admitting: *Deleted

## 2020-06-29 ENCOUNTER — Encounter (HOSPITAL_COMMUNITY): Payer: Self-pay | Admitting: Psychiatry

## 2020-06-29 ENCOUNTER — Inpatient Hospital Stay (HOSPITAL_COMMUNITY)
Admission: AD | Admit: 2020-06-29 | Discharge: 2020-07-12 | DRG: 897 | Disposition: A | Discharge status: Home / Self Care | Payer: Medicaid Other | Source: Intra-hospital | Attending: Psychiatry | Admitting: Psychiatry

## 2020-06-29 ENCOUNTER — Ambulatory Visit (HOSPITAL_COMMUNITY)
Admission: EM | Admit: 2020-06-29 | Discharge: 2020-06-29 | Disposition: A | Discharge status: Home / Self Care | Payer: Medicaid Other | Source: Home / Self Care | Attending: Psychiatry | Admitting: Psychiatry

## 2020-06-29 ENCOUNTER — Other Ambulatory Visit: Payer: Self-pay

## 2020-06-29 DIAGNOSIS — Z20822 Contact with and (suspected) exposure to covid-19: Secondary | ICD-10-CM | POA: Diagnosis present

## 2020-06-29 DIAGNOSIS — R61 Generalized hyperhidrosis: Secondary | ICD-10-CM | POA: Diagnosis present

## 2020-06-29 DIAGNOSIS — F1123 Opioid dependence with withdrawal: Secondary | ICD-10-CM | POA: Insufficient documentation

## 2020-06-29 DIAGNOSIS — Z885 Allergy status to narcotic agent status: Secondary | ICD-10-CM | POA: Diagnosis not present

## 2020-06-29 DIAGNOSIS — F4310 Post-traumatic stress disorder, unspecified: Secondary | ICD-10-CM | POA: Diagnosis present

## 2020-06-29 DIAGNOSIS — F1120 Opioid dependence, uncomplicated: Secondary | ICD-10-CM

## 2020-06-29 DIAGNOSIS — Z981 Arthrodesis status: Secondary | ICD-10-CM

## 2020-06-29 DIAGNOSIS — R633 Feeding difficulties, unspecified: Secondary | ICD-10-CM | POA: Diagnosis present

## 2020-06-29 DIAGNOSIS — F332 Major depressive disorder, recurrent severe without psychotic features: Secondary | ICD-10-CM | POA: Diagnosis present

## 2020-06-29 DIAGNOSIS — F17210 Nicotine dependence, cigarettes, uncomplicated: Secondary | ICD-10-CM | POA: Diagnosis present

## 2020-06-29 DIAGNOSIS — F1410 Cocaine abuse, uncomplicated: Secondary | ICD-10-CM | POA: Diagnosis present

## 2020-06-29 DIAGNOSIS — F321 Major depressive disorder, single episode, moderate: Secondary | ICD-10-CM

## 2020-06-29 DIAGNOSIS — R45851 Suicidal ideations: Secondary | ICD-10-CM | POA: Diagnosis present

## 2020-06-29 DIAGNOSIS — Z91040 Latex allergy status: Secondary | ICD-10-CM | POA: Diagnosis not present

## 2020-06-29 DIAGNOSIS — F1994 Other psychoactive substance use, unspecified with psychoactive substance-induced mood disorder: Secondary | ICD-10-CM

## 2020-06-29 DIAGNOSIS — F1193 Opioid use, unspecified with withdrawal: Secondary | ICD-10-CM

## 2020-06-29 MED ORDER — LOPERAMIDE HCL 2 MG PO CAPS
2.0000 mg | ORAL_CAPSULE | ORAL | Status: AC | PRN
  Administered 2020-07-01 – 2020-07-02 (×2): 4 mg via ORAL
  Filled 2020-06-29 (×2): qty 2

## 2020-06-29 MED ORDER — LORAZEPAM 1 MG PO TABS
1.0000 mg | ORAL_TABLET | Freq: Once | ORAL | Status: AC
  Administered 2020-06-29: 1 mg via ORAL
  Filled 2020-06-29: qty 1

## 2020-06-29 MED ORDER — ONDANSETRON HCL 4 MG PO TABS: 4.0000 mg | ORAL_TABLET | Freq: Three times a day (TID) | ORAL | Status: DC | PRN

## 2020-06-29 MED ORDER — TRAZODONE HCL 50 MG PO TABS: 50.0000 mg | ORAL_TABLET | Freq: Every evening | ORAL | Status: DC | PRN

## 2020-06-29 MED ORDER — CLONIDINE HCL 0.1 MG PO TABS
0.1000 mg | ORAL_TABLET | ORAL | Status: DC
  Filled 2020-06-29: qty 1

## 2020-06-29 MED ORDER — DICYCLOMINE HCL 20 MG PO TABS
20.0000 mg | ORAL_TABLET | Freq: Four times a day (QID) | ORAL | Status: DC | PRN
  Administered 2020-06-29: 20 mg via ORAL
  Filled 2020-06-29: qty 1

## 2020-06-29 MED ORDER — CLONIDINE HCL 0.1 MG PO TABS: 0.1000 mg | ORAL_TABLET | Freq: Every day | ORAL | Status: DC

## 2020-06-29 MED ORDER — NAPROXEN 500 MG PO TABS
500.0000 mg | ORAL_TABLET | Freq: Two times a day (BID) | ORAL | Status: DC | PRN
  Administered 2020-06-29: 500 mg via ORAL
  Filled 2020-06-29: qty 1

## 2020-06-29 MED ORDER — NICOTINE 21 MG/24HR TD PT24
21.0000 mg | MEDICATED_PATCH | Freq: Every day | TRANSDERMAL | Status: DC
  Administered 2020-06-30 – 2020-07-12 (×13): 21 mg via TRANSDERMAL
  Filled 2020-06-29 (×16): qty 1

## 2020-06-29 MED ORDER — CLONIDINE HCL 0.1 MG PO TABS
0.1000 mg | ORAL_TABLET | Freq: Four times a day (QID) | ORAL | Status: DC
Start: 2020-06-29 — End: 2020-06-29
  Administered 2020-06-29 (×3): 0.1 mg via ORAL
  Filled 2020-06-29 (×3): qty 1

## 2020-06-29 MED ORDER — HYDROXYZINE HCL 25 MG PO TABS
25.0000 mg | ORAL_TABLET | ORAL | Status: AC
  Administered 2020-06-29: 25 mg via ORAL
  Filled 2020-06-29 (×2): qty 1

## 2020-06-29 MED ORDER — TRAZODONE HCL 100 MG PO TABS
100.0000 mg | ORAL_TABLET | ORAL | Status: AC
  Administered 2020-06-29: 100 mg via ORAL
  Filled 2020-06-29 (×2): qty 1

## 2020-06-29 MED ORDER — METHOCARBAMOL 500 MG PO TABS
500.0000 mg | ORAL_TABLET | Freq: Three times a day (TID) | ORAL | Status: AC | PRN
  Administered 2020-06-29 – 2020-07-02 (×4): 500 mg via ORAL
  Filled 2020-06-29 (×4): qty 1

## 2020-06-29 MED ORDER — LOPERAMIDE HCL 2 MG PO CAPS: 2.0000 mg | ORAL_CAPSULE | ORAL | Status: DC | PRN

## 2020-06-29 MED ORDER — ALUM & MAG HYDROXIDE-SIMETH 200-200-20 MG/5ML PO SUSP: 30.0000 mL | ORAL | Status: DC | PRN

## 2020-06-29 MED ORDER — ONDANSETRON 4 MG PO TBDP: 4.0000 mg | ORAL_TABLET | Freq: Four times a day (QID) | ORAL | Status: DC | PRN

## 2020-06-29 MED ORDER — MAGNESIUM HYDROXIDE 400 MG/5ML PO SUSP: 30.0000 mL | Freq: Every day | ORAL | Status: DC | PRN

## 2020-06-29 MED ORDER — HYDROXYZINE HCL 25 MG PO TABS
25.0000 mg | ORAL_TABLET | Freq: Four times a day (QID) | ORAL | Status: DC | PRN
  Administered 2020-06-29: 25 mg via ORAL
  Filled 2020-06-29: qty 1

## 2020-06-29 MED ORDER — METHOCARBAMOL 500 MG PO TABS
500.0000 mg | ORAL_TABLET | Freq: Three times a day (TID) | ORAL | Status: DC | PRN
  Administered 2020-06-29: 500 mg via ORAL
  Filled 2020-06-29: qty 1

## 2020-06-29 MED ORDER — CLONIDINE HCL 0.1 MG PO TABS
0.1000 mg | ORAL_TABLET | ORAL | Status: DC | PRN
Start: 2020-06-29 — End: 2020-07-06
  Administered 2020-07-03 – 2020-07-05 (×4): 0.1 mg via ORAL
  Filled 2020-06-29 (×2): qty 1

## 2020-06-29 MED ORDER — HYDROXYZINE HCL 25 MG PO TABS: 25.0000 mg | ORAL_TABLET | Freq: Three times a day (TID) | ORAL | Status: DC | PRN

## 2020-06-29 MED ORDER — DICYCLOMINE HCL 10 MG PO CAPS: 10.0000 mg | ORAL_CAPSULE | Freq: Four times a day (QID) | ORAL | Status: DC | PRN

## 2020-06-29 MED ORDER — CLONIDINE HCL 0.1 MG PO TABS
0.1000 mg | ORAL_TABLET | Freq: Four times a day (QID) | ORAL | Status: DC
  Administered 2020-06-29 – 2020-06-30 (×6): 0.1 mg via ORAL
  Filled 2020-06-29 (×12): qty 1

## 2020-06-29 MED ORDER — DICYCLOMINE HCL 20 MG PO TABS
20.0000 mg | ORAL_TABLET | Freq: Four times a day (QID) | ORAL | Status: AC | PRN
  Administered 2020-06-29 – 2020-07-01 (×3): 20 mg via ORAL
  Filled 2020-06-29 (×4): qty 1

## 2020-06-29 MED ORDER — ACETAMINOPHEN 325 MG PO TABS
650.0000 mg | ORAL_TABLET | Freq: Four times a day (QID) | ORAL | Status: DC | PRN
  Administered 2020-07-02 – 2020-07-11 (×3): 650 mg via ORAL
  Filled 2020-06-29 (×3): qty 2

## 2020-06-29 MED ORDER — ONDANSETRON 4 MG PO TBDP: 4.0000 mg | ORAL_TABLET | Freq: Four times a day (QID) | ORAL | Status: AC | PRN

## 2020-06-29 MED ORDER — MAGNESIUM HYDROXIDE 400 MG/5ML PO SUSP
30.0000 mL | Freq: Every day | ORAL | Status: DC | PRN
Start: 2020-06-29 — End: 2020-07-12

## 2020-06-29 MED ORDER — CLONIDINE HCL 0.1 MG PO TABS: 0.1000 mg | ORAL_TABLET | ORAL | Status: DC

## 2020-06-29 MED ORDER — NAPROXEN 500 MG PO TABS
500.0000 mg | ORAL_TABLET | Freq: Two times a day (BID) | ORAL | Status: AC | PRN
  Administered 2020-06-30 – 2020-07-04 (×5): 500 mg via ORAL
  Filled 2020-06-29 (×5): qty 1

## 2020-06-29 MED ORDER — HYDROXYZINE HCL 25 MG PO TABS
25.0000 mg | ORAL_TABLET | Freq: Four times a day (QID) | ORAL | Status: DC | PRN
  Administered 2020-06-29 – 2020-07-02 (×7): 25 mg via ORAL
  Filled 2020-06-29 (×7): qty 1

## 2020-06-29 MED ORDER — ALUM & MAG HYDROXIDE-SIMETH 200-200-20 MG/5ML PO SUSP
30.0000 mL | ORAL | Status: DC | PRN
  Administered 2020-07-04: 30 mL via ORAL
  Filled 2020-06-29: qty 30

## 2020-06-29 MED ORDER — METHOCARBAMOL 750 MG PO TABS: 750.0000 mg | ORAL_TABLET | Freq: Four times a day (QID) | ORAL | Status: DC | PRN

## 2020-06-29 NOTE — BH Assessment (Signed)
Per Lynnda Shields, [facility], patient has been accepted to [facility], bed 302-1 ; Accepting provider is Dr. Myles Lipps, MD; Attending provider is Dr. Myles Lipps, MD.    Patient can arrive at 1600. Number for report is [phone].    Dr. Ernie Hew, MD notified   Radonna Ricker, MSW, LCSW Clinical Social Worker ([place]) [facility]

## 2020-06-29 NOTE — Progress Notes (Signed)
Steve Henry is a 40 year old male being admitted voluntarily to 302-1 from [facility].  He presented to [facility] requesting detox and help with his suicidal ideation.  He was very quiet during admission and would only answer with yes/no.  He kept his head down and minimal interaction.  He reported that he is homeless and that is his biggest stressor.  He requested to be started on Suboxone for his withdrawal symptoms.  Informed his that he is on the Clonidine protocol and he stated that it will not work because of the severity of his withdrawal symptoms.  He currently denied SI and stated that he will seek out staff if that changes.  He denies HI or A/V hallucinations.  Oriented him to the unit.  Admission paperwork completed and signed.  Belongings searched and secured in locker # 41, no contraband found.  Skin assessment completed and he has multiple tattoos.  Q 15 minute checks initiated for safety.  We will continue to monitor the progress towards his goals.

## 2020-06-29 NOTE — BH Assessment (Signed)
Steve Henry, NT/NS at [facility] transferred clinician to pt's nurse to see if the pt is able to engage in TTS assessment however the phone rang a few times then went voicemail.   Clinician to check back.    Vertell Novak, MS, [facility], [facility] Triage Specialist [phone].

## 2020-06-29 NOTE — Discharge Instructions (Signed)
Transfer to [facility] 

## 2020-06-29 NOTE — ED Provider Notes (Signed)
Behavioral Health Progress Note  Date and Time: 06/29/2020 12:06 PM Name: Steve Henry MRN:  [id number]  Subjective:  Patient interviewed this AM in the observation area. Patient noted to be writhing in bed d/t opiate withdrawal, appearing in moderate distress. He reports SI, unable to contract for safety. Denies HI/AVH. He reports opiate withdrawal sx of chills, body aches, abdominal pain, diaphoresis. Denies diarrhea. Pt irritable, puts covers over his head and stops asking questions. Informed patient of PRN medications ordered as well as recommendation for inpatient admission as unable to contract for safety. Pt verbalized undestanding.  Diagnosis:  Final diagnoses:  Opioid use with withdrawal (HCC)  Opioid use disorder, severe, dependence (HCC)  Substance induced mood disorder (HCC)    Total Time spent with patient: 15 minutes  Past Psychiatric History: polysubstance abuse Past Medical History:  Past Medical History:  Diagnosis Date  . Chronic back pain   . Chronic hand pain   . DDD (degenerative disc disease), cervical   . De Quervain's tenosynovitis   . Drug-seeking behavior   . Narcotic dependence ([place])     Past Surgical History:  Procedure Laterality Date  . MASS EXCISION N/A 03/13/2018   Procedure: EXCISION LIPOMA SCALP;  Surgeon: Aviva Signs, MD;  Location: [facility];  Service: General;  Laterality: N/A;  pt knows to arrive at 6:15  . SPINAL FUSION     Family History:  Family History  Family history unknown: Yes   Family Psychiatric  History: see H&P Social History:  Social History   Substance and Sexual Activity  Alcohol Use No     Social History   Substance and Sexual Activity  Drug Use Yes   Comment: heroin and Fentanyl    Social History   Socioeconomic History  . Marital status: Legally Separated    Spouse name: Not on file  . Number of children: Not on file  . Years of education: Not on file  . Highest education level: Not on file   Occupational History  . Not on file  Tobacco Use  . Smoking status: Current Every Day Smoker    Packs/day: 0.50    Years: 16.00    Pack years: 8.00    Types: Cigarettes  . Smokeless tobacco: Never Used  Vaping Use  . Vaping Use: Never used  Substance and Sexual Activity  . Alcohol use: No  . Drug use: Yes    Comment: heroin and Fentanyl  . Sexual activity: Yes    Birth control/protection: None  Other Topics Concern  . Not on file  Social History Narrative  . Not on file   Social Determinants of Health   Financial Resource Strain: Not on file  Food Insecurity: Not on file  Transportation Needs: Not on file  Physical Activity: Not on file  Stress: Not on file  Social Connections: Not on file   SDOH:  SDOH Screenings   Alcohol Screen: Not on file  Depression [id number]): Not on file  Financial Resource Strain: Not on file  Food Insecurity: Not on file  Housing: Not on file  Physical Activity: Not on file  Social Connections: Not on file  Stress: Not on file  Tobacco Use: High Risk  . Smoking Tobacco Use: Current Every Day Smoker  . Smokeless Tobacco Use: Never Used  Transportation Needs: Not on file   Additional Social History:  Sleep: Fair  Appetite:  Fair  Current Medications:  Current Facility-Administered Medications  Medication Dose Route Frequency Provider Last Rate Last Admin  . alum & mag hydroxide-simeth (MAALOX/MYLANTA) 200-200-20 MG/5ML suspension 30 mL  30 mL Oral Q4H PRN Lindon Romp A, NP      . cloNIDine (CATAPRES) tablet 0.1 mg  0.1 mg Oral QID Lindon Romp A, NP   0.1 mg at 06/29/20 0856   Followed by  . [START ON 06/30/2020] cloNIDine (CATAPRES) tablet 0.1 mg  0.1 mg Oral BH-qamhs Rozetta Nunnery, NP       Followed by  . [START ON 07/03/2020] cloNIDine (CATAPRES) tablet 0.1 mg  0.1 mg Oral QAC breakfast Lindon Romp A, NP      . dicyclomine (BENTYL) tablet 20 mg  20 mg Oral Q6H PRN Lindon Romp A, NP   20 mg at  06/29/20 0855  . hydrOXYzine (ATARAX/VISTARIL) tablet 25 mg  25 mg Oral Q6H PRN Lindon Romp A, NP   25 mg at 06/29/20 0506  . loperamide (IMODIUM) capsule 2-4 mg  2-4 mg Oral PRN Lindon Romp A, NP      . magnesium hydroxide (MILK OF MAGNESIA) suspension 30 mL  30 mL Oral Daily PRN Lindon Romp A, NP      . methocarbamol (ROBAXIN) tablet 500 mg  500 mg Oral Q8H PRN Lindon Romp A, NP   500 mg at 06/29/20 0854  . naproxen (NAPROSYN) tablet 500 mg  500 mg Oral BID PRN Lindon Romp A, NP   500 mg at 06/29/20 0853  . ondansetron (ZOFRAN-ODT) disintegrating tablet 4 mg  4 mg Oral Q6H PRN Lindon Romp A, NP      . traZODone (DESYREL) tablet 50 mg  50 mg Oral QHS PRN Rozetta Nunnery, NP       Current Outpatient Medications  Medication Sig Dispense Refill  . cloNIDine (CATAPRES) 0.1 MG tablet 1 tab po tid x 2 days, then bid x 2 days, then once daily x 2 days (Patient not taking: No sig reported) 12 tablet 0  . hydrOXYzine (ATARAX/VISTARIL) 25 MG tablet Take 1 tablet (25 mg total) by mouth every 6 (six) hours as needed for anxiety. (Patient not taking: No sig reported) 15 tablet 0  . SUBOXONE 8-2 MG FILM Place 0.5 strips under the tongue 2 (two) times daily. (Patient not taking: No sig reported)      Labs  Lab Results:  Admission on 06/28/2020, Discharged on 06/29/2020  Component Date Value Ref Range Status  . Sodium 06/28/2020 136  135 - 145 mmol/L Final  . Potassium 06/28/2020 4.1  3.5 - 5.1 mmol/L Final  . Chloride 06/28/2020 99  98 - 111 mmol/L Final  . CO2 06/28/2020 26  22 - 32 mmol/L Final  . Glucose, Bld 06/28/2020 108* 70 - 99 mg/dL Final   Glucose reference range applies only to samples taken after fasting for at least 8 hours.  . BUN 06/28/2020 17  6 - 20 mg/dL Final  . Creatinine, Ser 06/28/2020 1.03  0.61 - 1.24 mg/dL Final  . Calcium 06/28/2020 9.4  8.9 - 10.3 mg/dL Final  . Total Protein 06/28/2020 8.4* 6.5 - 8.1 g/dL Final  . Albumin 06/28/2020 4.4  3.5 - 5.0 g/dL Final  . AST  06/28/2020 17  15 - 41 U/L Final  . ALT 06/28/2020 13  0 - 44 U/L Final  . Alkaline Phosphatase 06/28/2020 107  38 - 126 U/L Final  . Total Bilirubin 06/28/2020  0.7  0.3 - 1.2 mg/dL Final  . GFR, Estimated 06/28/2020 >60  >60 mL/min Final   Comment: (NOTE) Calculated using the CKD-EPI Creatinine Equation (2021)   . Anion gap 06/28/2020 11  5 - 15 Final   Performed at [facility], [place] [address] [place], [place] [address]  . Alcohol, Ethyl (B) 06/28/2020 <10  <10 mg/dL Final   Comment: (NOTE) Lowest detectable limit for serum alcohol is 10 mg/dL.  For medical purposes only. Performed at [facility], [place] [address], [place], [place] [address]   . Salicylate Lvl 65/78/4696 <7.0* 7.0 - 30.0 mg/dL Final   Performed at [place] [address], [place], [place] [address]  . Acetaminophen (Tylenol), Serum 06/28/2020 <10* 10 - 30 ug/mL Final   Comment: (NOTE) Therapeutic concentrations vary significantly. A range of 10-30 ug/mL  may be an effective concentration for many patients. However, some  are best treated at concentrations outside of this range. Acetaminophen concentrations >150 ug/mL at 4 hours after ingestion  and >50 ug/mL at 12 hours after ingestion are often associated with  toxic reactions.  Performed at [facility], [place] [address], [place], [place] [address]   . WBC 06/28/2020 6.7  4.0 - 10.5 K/uL Final  . RBC 06/28/2020 5.46  4.22 - 5.81 MIL/uL Final  . Hemoglobin 06/28/2020 15.3  13.0 - 17.0 g/dL Final  . HCT 06/28/2020 46.4  39.0 - 52.0 % Final  . MCV 06/28/2020 85.0  80.0 - 100.0 fL Final  . MCH 06/28/2020 28.0  26.0 - 34.0 pg Final  . MCHC 06/28/2020 33.0  30.0 - 36.0 g/dL Final  . RDW 06/28/2020 12.1  11.5 - 15.5 % Final  . Platelets 06/28/2020 395  150 - 400 K/uL Final  . nRBC 06/28/2020 0.0  0.0 - 0.2 % Final   Performed at [facility], [place] [address],  [place], [place] [address]  . Opiates 06/28/2020 POSITIVE* NONE DETECTED Final  . Cocaine 06/28/2020 POSITIVE* NONE DETECTED Final  . Benzodiazepines 06/28/2020 NONE DETECTED  NONE DETECTED Final  . Amphetamines 06/28/2020 POSITIVE* NONE DETECTED Final  . Tetrahydrocannabinol 06/28/2020 NONE DETECTED  NONE DETECTED Final  . Barbiturates 06/28/2020 NONE DETECTED  NONE DETECTED Final   Comment: (NOTE) DRUG SCREEN FOR MEDICAL PURPOSES ONLY.  IF CONFIRMATION IS NEEDED FOR ANY PURPOSE, NOTIFY LAB WITHIN 5 DAYS.  LOWEST DETECTABLE LIMITS FOR URINE DRUG SCREEN Drug Class                     Cutoff (ng/mL) Amphetamine and metabolites    1000 Barbiturate and metabolites    200 Benzodiazepine                 725 Tricyclics and metabolites     300 Opiates and metabolites        300 Cocaine and metabolites        300 THC                            50 Performed at [facility], [place] [address], [place], [place] [address]   . SARS Coronavirus 2 by RT PCR 06/28/2020 NEGATIVE  NEGATIVE Final   Comment: (NOTE) SARS-CoV-2 target nucleic acids are NOT DETECTED.  The SARS-CoV-2 RNA is generally detectable in upper respiratory specimens during the acute phase of infection. The lowest concentration of SARS-CoV-2 viral copies this assay  can detect is 138 copies/mL. A negative result does not preclude SARS-Cov-2 infection and should not be used as the sole basis for treatment or other patient management decisions. A negative result may occur with  improper specimen collection/handling, submission of specimen other than nasopharyngeal swab, presence of viral mutation(s) within the areas targeted by this assay, and inadequate number of viral copies(<138 copies/mL). A negative result must be combined with clinical observations, patient history, and epidemiological information. The expected result is Negative.  Fact Sheet for Patients:  EntrepreneurPulse.com.au  Fact  Sheet for Healthcare Providers:  IncredibleEmployment.be  This test is no                          t yet approved or cleared by the [place] FDA and  has been authorized for detection and/or diagnosis of SARS-CoV-2 by FDA under an Emergency Use Authorization (EUA). This EUA will remain  in effect (meaning this test can be used) for the duration of the COVID-19 declaration under Section 564(b)(1) of the Act, 21 U.S.C.section 360bbb-3(b)(1), unless the authorization is terminated  or revoked sooner.      . Influenza A by PCR 06/28/2020 NEGATIVE  NEGATIVE Final  . Influenza B by PCR 06/28/2020 NEGATIVE  NEGATIVE Final   Comment: (NOTE) The Xpert Xpress SARS-CoV-2/FLU/RSV plus assay is intended as an aid in the diagnosis of influenza from Nasopharyngeal swab specimens and should not be used as a sole basis for treatment. Nasal washings and aspirates are unacceptable for Xpert Xpress SARS-CoV-2/FLU/RSV testing.  Fact Sheet for Patients: EntrepreneurPulse.com.au  Fact Sheet for Healthcare Providers: IncredibleEmployment.be  This test is not yet approved or cleared by the [place] FDA and has been authorized for detection and/or diagnosis of SARS-CoV-2 by FDA under an Emergency Use Authorization (EUA). This EUA will remain in effect (meaning this test can be used) for the duration of the COVID-19 declaration under Section 564(b)(1) of the Act, 21 U.S.C. section 360bbb-3(b)(1), unless the authorization is terminated or revoked.  Performed at [facility], [place] [address], [place], [place] [address]   Admission on 12/29/2019, Discharged on 12/30/2019  Component Date Value Ref Range Status  . Alcohol, Ethyl (B) 12/30/2019 <10  <10 mg/dL Final   Comment: (NOTE) Lowest detectable limit for serum alcohol is 10 mg/dL.  For medical purposes only. Performed at [facility], [place] [address],  [place], [place] [address]   . Prothrombin Time 12/30/2019 13.1  11.4 - 15.2 seconds Final  . INR 12/30/2019 1.0  0.8 - 1.2 Final   Comment: (NOTE) INR goal varies based on device and disease states. Performed at [facility], [place] [address] [place], [place] [address]   . aPTT 12/30/2019 29  24 - 36 seconds Final   Performed at [facility], [place] [address], [place], [place] [address]  . WBC 12/30/2019 8.8  4.0 - 10.5 K/uL Final  . RBC 12/30/2019 4.75  4.22 - 5.81 MIL/uL Final  . Hemoglobin 12/30/2019 13.6  13.0 - 17.0 g/dL Final  . HCT 12/30/2019 42.7  39.0 - 52.0 % Final  . MCV 12/30/2019 89.9  80.0 - 100.0 fL Final  . MCH 12/30/2019 28.6  26.0 - 34.0 pg Final  . MCHC 12/30/2019 31.9  30.0 - 36.0 g/dL Final  . RDW 12/30/2019 13.5  11.5 - 15.5 % Final  . Platelets 12/30/2019 267  150 - 400 K/uL Final  . nRBC 12/30/2019  0.0  0.0 - 0.2 % Final   Performed at [facility], [place] [address] [place], [place] [address]  . Neutrophils Relative % 12/30/2019 65  % Final  . Neutro Abs 12/30/2019 5.8  1.7 - 7.7 K/uL Final  . Lymphocytes Relative 12/30/2019 22  % Final  . Lymphs Abs 12/30/2019 2.0  0.7 - 4.0 K/uL Final  . Monocytes Relative 12/30/2019 9  % Final  . Monocytes Absolute 12/30/2019 0.8  0.1 - 1.0 K/uL Final  . Eosinophils Relative 12/30/2019 3  % Final  . Eosinophils Absolute 12/30/2019 0.2  0.0 - 0.5 K/uL Final  . Basophils Relative 12/30/2019 0  % Final  . Basophils Absolute 12/30/2019 0.0  0.0 - 0.1 K/uL Final  . Immature Granulocytes 12/30/2019 1  % Final  . Abs Immature Granulocytes 12/30/2019 0.04  0.00 - 0.07 K/uL Final   Performed at [facility], [place] [address], [place], [place] [address]  . Sodium 12/30/2019 138  135 - 145 mmol/L Final  . Potassium 12/30/2019 3.8  3.5 - 5.1 mmol/L Final  . Chloride 12/30/2019 105  98 - 111 mmol/L Final  . CO2 12/30/2019 25  22 - 32 mmol/L Final  . Glucose, Bld 12/30/2019 96  70 - 99 mg/dL Final   Glucose reference  range applies only to samples taken after fasting for at least 8 hours.  . BUN 12/30/2019 8  6 - 20 mg/dL Final  . Creatinine, Ser 12/30/2019 0.88  0.61 - 1.24 mg/dL Final  . Calcium 12/30/2019 9.1  8.9 - 10.3 mg/dL Final  . Total Protein 12/30/2019 7.2  6.5 - 8.1 g/dL Final  . Albumin 12/30/2019 3.8  3.5 - 5.0 g/dL Final  . AST 12/30/2019 41  15 - 41 U/L Final  . ALT 12/30/2019 61* 0 - 44 U/L Final  . Alkaline Phosphatase 12/30/2019 84  38 - 126 U/L Final  . Total Bilirubin 12/30/2019 0.6  0.3 - 1.2 mg/dL Final  . GFR calc non Af Amer 12/30/2019 >60  >60 mL/min Final  . GFR calc Af Amer 12/30/2019 >60  >60 mL/min Final  . Anion gap 12/30/2019 8  5 - 15 Final   Performed at [place] [address], [place],  [address]  . Opiates 12/30/2019 NONE DETECTED  NONE DETECTED Final  . Cocaine 12/30/2019 NONE DETECTED  NONE DETECTED Final  . Benzodiazepines 12/30/2019 NONE DETECTED  NONE DETECTED Final  . Amphetamines 12/30/2019 NONE DETECTED  NONE DETECTED Final  . Tetrahydrocannabinol 12/30/2019 NONE DETECTED  NONE DETECTED Final  . Barbiturates 12/30/2019 NONE DETECTED  NONE DETECTED Final   Comment: (NOTE) DRUG SCREEN FOR MEDICAL PURPOSES ONLY.  IF CONFIRMATION IS NEEDED FOR ANY PURPOSE, NOTIFY LAB WITHIN 5 DAYS.  LOWEST DETECTABLE LIMITS FOR URINE DRUG SCREEN Drug Class                     Cutoff (ng/mL) Amphetamine and metabolites    1000 Barbiturate and metabolites    200 Benzodiazepine                 893 Tricyclics and metabolites     300 Opiates and metabolites        300 Cocaine and metabolites        300 THC                            50 Performed at  [facility], [place] [address], [place], [place] [address]   . Color, Urine 12/30/2019 STRAW* YELLOW Final  . APPearance 12/30/2019 CLEAR  CLEAR Final  . Specific Gravity, Urine 12/30/2019 1.006  1.005 - 1.030 Final  . pH 12/30/2019 8.0  5.0 - 8.0 Final  . Glucose, UA 12/30/2019 NEGATIVE  NEGATIVE mg/dL  Final  . Hgb urine dipstick 12/30/2019 NEGATIVE  NEGATIVE Final  . Bilirubin Urine 12/30/2019 NEGATIVE  NEGATIVE Final  . Ketones, ur 12/30/2019 NEGATIVE  NEGATIVE mg/dL Final  . Protein, ur 12/30/2019 NEGATIVE  NEGATIVE mg/dL Final  . Nitrite 12/30/2019 NEGATIVE  NEGATIVE Final  . Chalmers Guest 12/30/2019 NEGATIVE  NEGATIVE Final   Performed at [facility], [place] [address] [place], [place] [address]    Blood Alcohol level:  Lab Results  Component Value Date   ETH <10 06/28/2020   ETH <10 99991111    Metabolic Disorder Labs: No results found for: HGBA1C, MPG No results found for: PROLACTIN No results found for: CHOL, TRIG, HDL, CHOLHDL, VLDL, LDLCALC  Therapeutic Lab Levels: No results found for: LITHIUM No results found for: VALPROATE No components found for:  CBMZ  Physical Findings     Musculoskeletal  Strength & Muscle Tone: within normal limits Gait & Station: normal Patient leans: N/A  Psychiatric Specialty Exam  Presentation  General Appearance: Appropriate for Environment; Fairly Groomed (moderate distress d/t opiate withdrawal)  Eye Contact:Fair  Speech:Clear and Coherent; Normal Rate (irritable tone)  Speech Volume:Normal  Handedness:Right   Mood and Affect  Mood:Anxious; Irritable  Affect:Congruent   Thought Process  Thought Processes:Coherent; Goal Directed; Linear  Descriptions of Associations:Intact  Orientation:Full (Time, Place and Person)  Thought Content:WDL  Hallucinations:Hallucinations: None  Ideas of Reference:None  Suicidal Thoughts:Suicidal Thoughts: Yes, Active  Homicidal Thoughts:Homicidal Thoughts: No   Sensorium  Memory:Immediate Good; Recent Good; Remote Good  Judgment:Intact  Insight:Fair   Executive Functions  Concentration:Fair  Attention Span:Fair  [place]  Language:Good   Psychomotor Activity  Psychomotor Activity:Psychomotor Activity:  Restlessness   Assets  Assets:Communication Skills; Desire for Improvement; Physical Health; Resilience   Sleep  Sleep:Sleep: Fair   Physical Exam  Physical Exam Constitutional:      General: He is in acute distress.     Appearance: Normal appearance.  HENT:     Head: Normocephalic and atraumatic.  Eyes:     Extraocular Movements: Extraocular movements intact.  Pulmonary:     Effort: Pulmonary effort is normal.  Neurological:     Mental Status: He is alert.    Review of Systems  Constitutional: Positive for chills.  Gastrointestinal: Negative for diarrhea and nausea.  Musculoskeletal: Positive for joint pain and myalgias.  Psychiatric/Behavioral: Positive for suicidal ideas.   Blood pressure 110/70, pulse 72, temperature (!) 97.5 F (36.4 C), temperature source Oral, resp. rate 18, SpO2 98 %. There is no height or weight on file to calculate BMI.  Treatment Plan Summary: Daily contact with patient to assess and evaluate symptoms and progress in treatment and Medication management -PRNs for opioid withdrawal  -recommendation inpatient admission at this time on the basis of danger to self   Ival Bible, MD 06/29/2020 12:06 PM

## 2020-06-29 NOTE — ED Notes (Signed)
Discharge instructions provided. Safe transport called and report called to Presenter, broadcasting at [facility]. Personal belongings returned from locker. Safety maintained. Pt alert, orient and ambulatory.

## 2020-06-29 NOTE — ED Notes (Signed)
Safe transportation called for transport to [facility]

## 2020-06-29 NOTE — ED Provider Notes (Signed)
Behavioral Health Admission [facility]&P [facility])  Date: 06/29/20 Patient Name: Steve Henry MRN: [id number] Chief Complaint:  Chief Complaint  Patient presents with  . Addiction Problem  . Suicidal      Diagnoses:  Final diagnoses:  Opioid use with withdrawal ([place])  Opioid use disorder, severe, dependence ([place])  Substance induced mood disorder (HCC)    HPI: Steve Henry is a 40 y.o. male who presented to [facility] requesting assistance for heroin/fentanyl withdrawal. He was transferred to [facility] for treatment and stabilization.   On evaluation, patient is alert and oriented x 4. He is pleasant and cooperative. Speech is clear and coherent. He reports that he has been having a hard time since losing custody of his children due to substance abuse. He reports using 0.5 gm to 1 gm daily of heroin/fentanyl. Reports last use 2 days ago. Denies use of alcohol and other substances. UDS positive for opiates, amphetamines, and cocaine. BAL <10. He reported in the ED that he was having nausea, vomiting, diarrhea, and chills. He states that he has not any any vomiting or diarrhea after arrival to the ED.  He was given phenergan IV, bentyl IM, robaxin IV, imodium PO, zofran IV, and toradol IV while in the ED. He reports that he continues to have chills. He is shaking thouhgout the assessment. His thought process is coherent and thought content is logical. He denies auditory and visual hallucinations. No indication that he is responding to internal stimuli. No evidence of delusional thought content. When asked about SI, he states "not yet." He denies homicidal ideations.   PHQ 2-9:   [facility] ED from 06/28/2020 in [place] DEPT ED from 11/29/2019 in [place] Low Risk No Risk       Total Time spent with patient: 20 minutes  Musculoskeletal  Strength & Muscle Tone: within normal limits Gait & Station: normal Patient leans:  N/A  Psychiatric Specialty Exam  Presentation General Appearance: Disheveled  Eye Contact:Fair  Speech:Clear and Coherent; Normal Rate  Speech Volume:Normal  Handedness:Right   Mood and Affect  Mood:Anxious; Depressed  Affect:Congruent   Thought Process  Thought Processes:Coherent  Descriptions of Associations:Intact  Orientation:Full (Time, Place and Person)  Thought Content:Logical  Hallucinations:Hallucinations: None  Ideas of Reference:None  Suicidal Thoughts:Suicidal Thoughts: No  Homicidal Thoughts:Homicidal Thoughts: No   Sensorium  Memory:Immediate Good; Recent Good; Remote Good  Judgment:Impaired  Insight:Fair   Executive Functions  Concentration:Fair  Attention Span:Fair  [place] of Knowledge:Good  Language:Good   Psychomotor Activity  Psychomotor Activity:Psychomotor Activity: Restlessness; Tremor   Assets  Assets:Communication Skills; Desire for Improvement; Physical Health   Sleep  Sleep:Sleep: Fair   Physical Exam Constitutional:      General: He is not in acute distress.    Appearance: He is not ill-appearing, toxic-appearing or diaphoretic.  HENT:     Head: Normocephalic.     Right Ear: External ear normal.     Left Ear: External ear normal.  Eyes:     Pupils: Pupils are equal, round, and reactive to light.  Cardiovascular:     Rate and Rhythm: Normal rate.  Pulmonary:     Effort: Pulmonary effort is normal. No respiratory distress.  Musculoskeletal:        General: Normal range of motion.  Skin:    General: Skin is warm and dry.  Neurological:     General: No focal deficit present.     Mental Status:  He is alert and oriented to person, place, and time.  Psychiatric:        Mood and Affect: Mood is anxious and depressed.        Speech: Speech normal.        Behavior: Behavior is cooperative.        Thought Content: Thought content is not paranoid or delusional. Thought content does not include  homicidal or suicidal ideation. Thought content does not include suicidal plan.    Review of Systems  Constitutional: Positive for chills and malaise/fatigue. Negative for diaphoresis, fever and weight loss.  Respiratory: Negative for cough and shortness of breath.   Cardiovascular: Negative for chest pain and palpitations.  Gastrointestinal: Positive for diarrhea (prior to arrival in the ED), nausea and vomiting.  Neurological: Positive for tremors. Negative for dizziness and seizures.  Psychiatric/Behavioral: Positive for depression and substance abuse. Negative for hallucinations, memory loss and suicidal ideas. The patient is nervous/anxious and has insomnia.     Blood pressure 125/80, pulse 79, temperature (!) 97.1 F (36.2 C), temperature source Oral, resp. rate 18, SpO2 98 %. There is no height or weight on file to calculate BMI.  Past Psychiatric History: Opioid use disorder(fentanyl/heroin)  Is the patient at risk to self? Yes  continued fentanyl/heroi abuse Has the patient been a risk to self in the past 6 months? Yes .    Has the patient been a risk to self within the distant past? Yes   Is the patient a risk to others? No   Has the patient been a risk to others in the past 6 months? No   Has the patient been a risk to others within the distant past? No   Past Medical History:  Past Medical History:  Diagnosis Date  . Chronic back pain   . Chronic hand pain   . DDD (degenerative disc disease), cervical   . De Quervain's tenosynovitis   . Drug-seeking behavior   . Narcotic dependence ([place])     Past Surgical History:  Procedure Laterality Date  . MASS EXCISION N/A 03/13/2018   Procedure: EXCISION LIPOMA SCALP;  Surgeon: Aviva Signs, MD;  Location: [facility];  Service: General;  Laterality: N/A;  pt knows to arrive at 6:15  . SPINAL FUSION      Family History:  Family History  Family history unknown: Yes    Social History:  Social History   Socioeconomic History   . Marital status: Legally Separated    Spouse name: Not on file  . Number of children: Not on file  . Years of education: Not on file  . Highest education level: Not on file  Occupational History  . Not on file  Tobacco Use  . Smoking status: Current Every Day Smoker    Packs/day: 0.50    Years: 16.00    Pack years: 8.00    Types: Cigarettes  . Smokeless tobacco: Never Used  Vaping Use  . Vaping Use: Never used  Substance and Sexual Activity  . Alcohol use: No  . Drug use: Yes    Comment: heroin and Fentanyl  . Sexual activity: Yes    Birth control/protection: None  Other Topics Concern  . Not on file  Social History Narrative  . Not on file   Social Determinants of Health   Financial Resource Strain: Not on file  Food Insecurity: Not on file  Transportation Needs: Not on file  Physical Activity: Not on file  Stress: Not on file  Social Connections: Not on file  Intimate Partner Violence: Not on file    SDOH:  SDOH Screenings   Alcohol Screen: Not on file  Depression (PHQ2-9): Not on file  Financial Resource Strain: Not on file  Food Insecurity: Not on file  Housing: Not on file  Physical Activity: Not on file  Social Connections: Not on file  Stress: Not on file  Tobacco Use: High Risk  . Smoking Tobacco Use: Current Every Day Smoker  . Smokeless Tobacco Use: Never Used  Transportation Needs: Not on file    Last Labs:  Admission on 06/28/2020, Discharged on 06/29/2020  Component Date Value Ref Range Status  . Sodium 06/28/2020 136  135 - 145 mmol/L Final  . Potassium 06/28/2020 4.1  3.5 - 5.1 mmol/L Final  . Chloride 06/28/2020 99  98 - 111 mmol/L Final  . CO2 06/28/2020 26  22 - 32 mmol/L Final  . Glucose, Bld 06/28/2020 108* 70 - 99 mg/dL Final   Glucose reference range applies only to samples taken after fasting for at least 8 hours.  . BUN 06/28/2020 17  6 - 20 mg/dL Final  . Creatinine, Ser 06/28/2020 1.03  0.61 - 1.24 mg/dL Final  . Calcium  06/28/2020 9.4  8.9 - 10.3 mg/dL Final  . Total Protein 06/28/2020 8.4* 6.5 - 8.1 g/dL Final  . Albumin 06/28/2020 4.4  3.5 - 5.0 g/dL Final  . AST 06/28/2020 17  15 - 41 U/L Final  . ALT 06/28/2020 13  0 - 44 U/L Final  . Alkaline Phosphatase 06/28/2020 107  38 - 126 U/L Final  . Total Bilirubin 06/28/2020 0.7  0.3 - 1.2 mg/dL Final  . GFR, Estimated 06/28/2020 >60  >60 mL/min Final   Comment: (NOTE) Calculated using the CKD-EPI Creatinine Equation (2021)   . Anion gap 06/28/2020 11  5 - 15 Final   Performed at [facility], [place] [address], [place], [place] [address]  . Alcohol, Ethyl (B) 06/28/2020 <10  <10 mg/dL Final   Comment: (NOTE) Lowest detectable limit for serum alcohol is 10 mg/dL.  For medical purposes only. Performed at [facility], [place] [address], [place], [place] [address]   . Salicylate Lvl [id number] <7.0* 7.0 - 30.0 mg/dL Final   Performed at [place] [address], [place], [place] [address]  . Acetaminophen (Tylenol), Serum 06/28/2020 <10* 10 - 30 ug/mL Final   Comment: (NOTE) Therapeutic concentrations vary significantly. A range of 10-30 ug/mL  may be an effective concentration for many patients. However, some  are best treated at concentrations outside of this range. Acetaminophen concentrations >150 ug/mL at 4 hours after ingestion  and >50 ug/mL at 12 hours after ingestion are often associated with  toxic reactions.  Performed at [facility], [place] [address], [place], [place] [address]   . WBC 06/28/2020 6.7  4.0 - 10.5 K/uL Final  . RBC 06/28/2020 5.46  4.22 - 5.81 MIL/uL Final  . Hemoglobin 06/28/2020 15.3  13.0 - 17.0 g/dL Final  . HCT 06/28/2020 46.4  39.0 - 52.0 % Final  . MCV 06/28/2020 85.0  80.0 - 100.0 fL Final  . MCH 06/28/2020 28.0  26.0 - 34.0 pg Final  . MCHC 06/28/2020 33.0  30.0 - 36.0 g/dL Final  . RDW 06/28/2020 12.1  11.5 - 15.5 % Final  . Platelets  06/28/2020 395  150 - 400 K/uL Final  . nRBC 06/28/2020 0.0  0.0 - 0.2 %  Final   Performed at [facility], [place] [address], [place], [place] [address]  . Opiates 06/28/2020 POSITIVE* NONE DETECTED Final  . Cocaine 06/28/2020 POSITIVE* NONE DETECTED Final  . Benzodiazepines 06/28/2020 NONE DETECTED  NONE DETECTED Final  . Amphetamines 06/28/2020 POSITIVE* NONE DETECTED Final  . Tetrahydrocannabinol 06/28/2020 NONE DETECTED  NONE DETECTED Final  . Barbiturates 06/28/2020 NONE DETECTED  NONE DETECTED Final   Comment: (NOTE) DRUG SCREEN FOR MEDICAL PURPOSES ONLY.  IF CONFIRMATION IS NEEDED FOR ANY PURPOSE, NOTIFY LAB WITHIN 5 DAYS.  LOWEST DETECTABLE LIMITS FOR URINE DRUG SCREEN Drug Class                     Cutoff (ng/mL) Amphetamine and metabolites    1000 Barbiturate and metabolites    200 Benzodiazepine                 [id number] Tricyclics and metabolites     300 Opiates and metabolites        300 Cocaine and metabolites        300 THC                            50 Performed at [facility], [place] [address] [place], [place] [address]   . SARS Coronavirus 2 by RT PCR 06/28/2020 NEGATIVE  NEGATIVE Final   Comment: (NOTE) SARS-CoV-2 target nucleic acids are NOT DETECTED.  The SARS-CoV-2 RNA is generally detectable in upper respiratory specimens during the acute phase of infection. The lowest concentration of SARS-CoV-2 viral copies this assay can detect is 138 copies/mL. A negative result does not preclude SARS-Cov-2 infection and should not be used as the sole basis for treatment or other patient management decisions. A negative result may occur with  improper specimen collection/handling, submission of specimen other than nasopharyngeal swab, presence of viral mutation(s) within the areas targeted by this assay, and inadequate number of viral copies(<138 copies/mL). A negative result must be combined with clinical observations, patient  history, and epidemiological information. The expected result is Negative.  Fact Sheet for Patients:  EntrepreneurPulse.com.au  Fact Sheet for Healthcare Providers:  IncredibleEmployment.be  This test is no                          t yet approved or cleared by the [place] FDA and  has been authorized for detection and/or diagnosis of SARS-CoV-2 by FDA under an Emergency Use Authorization (EUA). This EUA will remain  in effect (meaning this test can be used) for the duration of the COVID-19 declaration under Section 564(b)(1) of the Act, 21 U.S.C.section 360bbb-3(b)(1), unless the authorization is terminated  or revoked sooner.      . Influenza A by PCR 06/28/2020 NEGATIVE  NEGATIVE Final  . Influenza B by PCR 06/28/2020 NEGATIVE  NEGATIVE Final   Comment: (NOTE) The Xpert Xpress SARS-CoV-2/FLU/RSV plus assay is intended as an aid in the diagnosis of influenza from Nasopharyngeal swab specimens and should not be used as a sole basis for treatment. Nasal washings and aspirates are unacceptable for Xpert Xpress SARS-CoV-2/FLU/RSV testing.  Fact Sheet for Patients: EntrepreneurPulse.com.au  Fact Sheet for Healthcare Providers: IncredibleEmployment.be  This test is not yet approved or cleared by the [place] FDA and has been authorized for detection and/or diagnosis of SARS-CoV-2 by FDA under an Emergency Use Authorization (EUA). This EUA will remain in effect (meaning  this test can be used) for the duration of the COVID-19 declaration under Section 564(b)(1) of the Act, 21 U.S.C. section 360bbb-3(b)(1), unless the authorization is terminated or revoked.  Performed at [facility], [place] [address], [place], [place] [address]   Admission on 12/29/2019, Discharged on 12/30/2019  Component Date Value Ref Range Status  . Alcohol, Ethyl (B) 12/30/2019 <10  <10 mg/dL Final    Comment: (NOTE) Lowest detectable limit for serum alcohol is 10 mg/dL.  For medical purposes only. Performed at [facility], [place] [address], [place], [place] [address]   . Prothrombin Time 12/30/2019 13.1  11.4 - 15.2 seconds Final  . INR 12/30/2019 1.0  0.8 - 1.2 Final   Comment: (NOTE) INR goal varies based on device and disease states. Performed at [facility], [place] [address], [place], [place] [address]   . aPTT 12/30/2019 29  24 - 36 seconds Final   Performed at [facility], [place] [address] [place], [place] [address]  . WBC 12/30/2019 8.8  4.0 - 10.5 K/uL Final  . RBC 12/30/2019 4.75  4.22 - 5.81 MIL/uL Final  . Hemoglobin 12/30/2019 13.6  13.0 - 17.0 g/dL Final  . HCT 12/30/2019 42.7  39.0 - 52.0 % Final  . MCV 12/30/2019 89.9  80.0 - 100.0 fL Final  . MCH 12/30/2019 28.6  26.0 - 34.0 pg Final  . MCHC 12/30/2019 31.9  30.0 - 36.0 g/dL Final  . RDW 12/30/2019 13.5  11.5 - 15.5 % Final  . Platelets 12/30/2019 267  150 - 400 K/uL Final  . nRBC 12/30/2019 0.0  0.0 - 0.2 % Final   Performed at [facility], [place] [address], [place], [place] [address]  . Neutrophils Relative % 12/30/2019 65  % Final  . Neutro Abs 12/30/2019 5.8  1.7 - 7.7 K/uL Final  . Lymphocytes Relative 12/30/2019 22  % Final  . Lymphs Abs 12/30/2019 2.0  0.7 - 4.0 K/uL Final  . Monocytes Relative 12/30/2019 9  % Final  . Monocytes Absolute 12/30/2019 0.8  0.1 - 1.0 K/uL Final  . Eosinophils Relative 12/30/2019 3  % Final  . Eosinophils Absolute 12/30/2019 0.2  0.0 - 0.5 K/uL Final  . Basophils Relative 12/30/2019 0  % Final  . Basophils Absolute 12/30/2019 0.0  0.0 - 0.1 K/uL Final  . Immature Granulocytes 12/30/2019 1  % Final  . Abs Immature Granulocytes 12/30/2019 0.04  0.00 - 0.07 K/uL Final   Performed at [facility], [place] [address], [place], [place] [address]  . Sodium 12/30/2019 138  135 - 145 mmol/L Final  . Potassium 12/30/2019 3.8  3.5 - 5.1 mmol/L Final  .  Chloride 12/30/2019 105  98 - 111 mmol/L Final  . CO2 12/30/2019 25  22 - 32 mmol/L Final  . Glucose, Bld 12/30/2019 96  70 - 99 mg/dL Final   Glucose reference range applies only to samples taken after fasting for at least 8 hours.  . BUN 12/30/2019 8  6 - 20 mg/dL Final  . Creatinine, Ser 12/30/2019 0.88  0.61 - 1.24 mg/dL Final  . Calcium 12/30/2019 9.1  8.9 - 10.3 mg/dL Final  . Total Protein 12/30/2019 7.2  6.5 - 8.1 g/dL Final  . Albumin 12/30/2019 3.8  3.5 - 5.0 g/dL Final  . AST 12/30/2019 41  15 - 41 U/L Final  . ALT 12/30/2019 61* 0 - 44 U/L Final  . Alkaline  Phosphatase 12/30/2019 84  38 - 126 U/L Final  . Total Bilirubin 12/30/2019 0.6  0.3 - 1.2 mg/dL Final  . GFR calc non Af Amer 12/30/2019 >60  >60 mL/min Final  . GFR calc Af Amer 12/30/2019 >60  >60 mL/min Final  . Anion gap 12/30/2019 8  5 - 15 Final   Performed at [place] [address], [place], [place] [address]  . Opiates 12/30/2019 NONE DETECTED  NONE DETECTED Final  . Cocaine 12/30/2019 NONE DETECTED  NONE DETECTED Final  . Benzodiazepines 12/30/2019 NONE DETECTED  NONE DETECTED Final  . Amphetamines 12/30/2019 NONE DETECTED  NONE DETECTED Final  . Tetrahydrocannabinol 12/30/2019 NONE DETECTED  NONE DETECTED Final  . Barbiturates 12/30/2019 NONE DETECTED  NONE DETECTED Final   Comment: (NOTE) DRUG SCREEN FOR MEDICAL PURPOSES ONLY.  IF CONFIRMATION IS NEEDED FOR ANY PURPOSE, NOTIFY LAB WITHIN 5 DAYS.  LOWEST DETECTABLE LIMITS FOR URINE DRUG SCREEN Drug Class                     Cutoff (ng/mL) Amphetamine and metabolites    1000 Barbiturate and metabolites    200 Benzodiazepine                 026 Tricyclics and metabolites     300 Opiates and metabolites        300 Cocaine and metabolites        300 THC                            50 Performed at [facility], [place] [address] [place], [place] [address]   . Color, Urine 12/30/2019 STRAW* YELLOW Final  . APPearance 12/30/2019 CLEAR   CLEAR Final  . Specific Gravity, Urine 12/30/2019 1.006  1.005 - 1.030 Final  . pH 12/30/2019 8.0  5.0 - 8.0 Final  . Glucose, UA 12/30/2019 NEGATIVE  NEGATIVE mg/dL Final  . Hgb urine dipstick 12/30/2019 NEGATIVE  NEGATIVE Final  . Bilirubin Urine 12/30/2019 NEGATIVE  NEGATIVE Final  . Ketones, ur 12/30/2019 NEGATIVE  NEGATIVE mg/dL Final  . Protein, ur 12/30/2019 NEGATIVE  NEGATIVE mg/dL Final  . Nitrite 12/30/2019 NEGATIVE  NEGATIVE Final  . Chalmers Guest 12/30/2019 NEGATIVE  NEGATIVE Final   Performed at [facility], [place] [address], [place], [place] [address]    Allergies: Tramadol and Latex  PTA Medications: (Not in a hospital admission)   Medical Decision Making  Patient was medically cleared in the ED Labs reviewed  Initiate clonidine opioid withdrawal protocol -clonidine taper -zofran 4 mg ODT every 6 hours prn nausea -naproxen 500 mg every BID prn for pain -robaxin 500 mg PO every 8 hours prn spasms -imodium 2-4 mg PO prn diarrhea -bentyl 20 mg PO every 6 hours prn abdominal cramping -hydroxyzine 25 mg every 6 hours prn anxiety     Recommendations  Based on my evaluation the patient does not appear to have an emergency medical condition.   He is requesting assistance with detox and residential substance abuse treatment.   Rozetta Nunnery, NP 06/29/20  3:35 AM

## 2020-06-29 NOTE — ED Provider Notes (Signed)
FBC/OBS ASAP Discharge Summary  Date and Time: 06/29/2020 2:22 PM  Name: Steve Henry  MRN:  [id number]   Discharge Diagnoses:  Final diagnoses:  Opioid use with withdrawal ([place])  Opioid use disorder, severe, dependence ([place])  Substance induced mood disorder ([place])    Subjective: Patient interviewed this AM in the observation area. Patient noted to be writhing in bed d/t opiate withdrawal, appearing in moderate distress. He reports SI, unable to contract for safety. Denies HI/AVH. He reports opiate withdrawal sx of chills, body aches, abdominal pain, diaphoresis. Denies diarrhea. Pt irritable, puts covers over his head and stops asking questions. Informed patient of PRN medications ordered as well as recommendation for inpatient admission as unable to contract for safety. Pt verbalized undestanding.  Stay Summary:  Steve Henry is a 40 y.o. male who presented to [facility] requesting assistance for heroin/fentanyl withdrawal. He was transferred to [facility] for treatment and stabilization.   On evaluation, patient is alert and oriented x 4. He is pleasant and cooperative. Speech is clear and coherent. He reports that he has been having a hard time since losing custody of his children due to substance abuse. He reports using 0.5 gm to 1 gm daily of heroin/fentanyl. Reports last use 2 days ago. Denies use of alcohol and other substances. UDS positive for opiates, amphetamines, and cocaine. BAL <10. He reported in the ED that he was having nausea, vomiting, diarrhea, and chills. He states that he has not any any vomiting or diarrhea after arrival to the ED.  He was given phenergan IV, bentyl IM, robaxin IV, imodium PO, zofran IV, and toradol IV while in the ED. He reports that he continues to have chills. He is shaking thouhgout the assessment. His thought process is coherent and thought content is logical. He denies auditory and visual hallucinations. No indication that he is responding to internal  stimuli. No evidence of delusional thought content. When asked about SI, he states "not yet." He denies homicidal ideations.  Patient admitted for overnight observation. The following morning, patient reported continued SI and was unable to contract for safety (see above for additional details) and recommended inpatient admission. Pt accepted by [facility]  Total Time spent with patient: 15 minutes  Past Psychiatric History: see H&P Past Medical History:  Past Medical History:  Diagnosis Date  . Chronic back pain   . Chronic hand pain   . DDD (degenerative disc disease), cervical   . De Quervain's tenosynovitis   . Drug-seeking behavior   . Narcotic dependence ([place])     Past Surgical History:  Procedure Laterality Date  . MASS EXCISION N/A 03/13/2018   Procedure: EXCISION LIPOMA SCALP;  Surgeon: Aviva Signs, MD;  Location: [facility];  Service: General;  Laterality: N/A;  pt knows to arrive at 6:15  . SPINAL FUSION     Family History:  Family History  Family history unknown: Yes   Family Psychiatric History: see H&P Social History:  Social History   Substance and Sexual Activity  Alcohol Use No     Social History   Substance and Sexual Activity  Drug Use Yes   Comment: heroin and Fentanyl    Social History   Socioeconomic History  . Marital status: Legally Separated    Spouse name: Not on file  . Number of children: Not on file  . Years of education: Not on file  . Highest education level: Not on file  Occupational History  . Not on file  Tobacco  Use  . Smoking status: Current Every Day Smoker    Packs/day: 0.50    Years: 16.00    Pack years: 8.00    Types: Cigarettes  . Smokeless tobacco: Never Used  Vaping Use  . Vaping Use: Never used  Substance and Sexual Activity  . Alcohol use: No  . Drug use: Yes    Comment: heroin and Fentanyl  . Sexual activity: Yes    Birth control/protection: None  Other Topics Concern  . Not on file  Social History  Narrative  . Not on file   Social Determinants of Health   Financial Resource Strain: Not on file  Food Insecurity: Not on file  Transportation Needs: Not on file  Physical Activity: Not on file  Stress: Not on file  Social Connections: Not on file   SDOH:  SDOH Screenings   Alcohol Screen: Not on file  Depression (DVV6-1): Not on file  Financial Resource Strain: Not on file  Food Insecurity: Not on file  Housing: Not on file  Physical Activity: Not on file  Social Connections: Not on file  Stress: Not on file  Tobacco Use: High Risk  . Smoking Tobacco Use: Current Every Day Smoker  . Smokeless Tobacco Use: Never Used  Transportation Needs: Not on file    Has this patient used any form of tobacco in the last 30 days? (Cigarettes, Smokeless Tobacco, Cigars, and/or Pipes) Prescription not provided because: n/a  Current Medications:  Current Facility-Administered Medications  Medication Dose Route Frequency Provider Last Rate Last Admin  . alum & mag hydroxide-simeth (MAALOX/MYLANTA) 200-200-20 MG/5ML suspension 30 mL  30 mL Oral Q4H PRN Lindon Romp A, NP      . cloNIDine (CATAPRES) tablet 0.1 mg  0.1 mg Oral QID Lindon Romp A, NP   0.1 mg at 06/29/20 0856   Followed by  . [START ON 06/30/2020] cloNIDine (CATAPRES) tablet 0.1 mg  0.1 mg Oral BH-qamhs Rozetta Nunnery, NP       Followed by  . [START ON 07/03/2020] cloNIDine (CATAPRES) tablet 0.1 mg  0.1 mg Oral QAC breakfast Lindon Romp A, NP      . dicyclomine (BENTYL) tablet 20 mg  20 mg Oral Q6H PRN Lindon Romp A, NP   20 mg at 06/29/20 0855  . hydrOXYzine (ATARAX/VISTARIL) tablet 25 mg  25 mg Oral Q6H PRN Lindon Romp A, NP   25 mg at 06/29/20 0506  . loperamide (IMODIUM) capsule 2-4 mg  2-4 mg Oral PRN Lindon Romp A, NP      . magnesium hydroxide (MILK OF MAGNESIA) suspension 30 mL  30 mL Oral Daily PRN Lindon Romp A, NP      . methocarbamol (ROBAXIN) tablet 500 mg  500 mg Oral Q8H PRN Lindon Romp A, NP   500 mg at  06/29/20 0854  . naproxen (NAPROSYN) tablet 500 mg  500 mg Oral BID PRN Lindon Romp A, NP   500 mg at 06/29/20 0853  . ondansetron (ZOFRAN-ODT) disintegrating tablet 4 mg  4 mg Oral Q6H PRN Lindon Romp A, NP      . traZODone (DESYREL) tablet 50 mg  50 mg Oral QHS PRN Rozetta Nunnery, NP       No current outpatient medications on file.    PTA Medications: (Not in a hospital admission)   Musculoskeletal  Strength & Muscle Tone: within normal limits Gait & Station: normal Patient leans: N/A  Psychiatric Specialty Exam  Presentation  General Appearance: Appropriate for  Environment; Fairly Groomed (moderate distress d/t opiate withdrawal)  Eye Contact:Fair  Speech:Clear and Coherent; Normal Rate (irritable tone)  Speech Volume:Normal  Handedness:Right   Mood and Affect  Mood:Anxious; Irritable  Affect:Congruent   Thought Process  Thought Processes:Coherent; Goal Directed; Linear  Descriptions of Associations:Intact  Orientation:Full (Time, Place and Person)  Thought Content:WDL  Hallucinations:Hallucinations: None  Ideas of Reference:None  Suicidal Thoughts:Suicidal Thoughts: Yes, Active  Homicidal Thoughts:Homicidal Thoughts: No   Sensorium  Memory:Immediate Good; Recent Good; Remote Good  Judgment:Intact  Insight:Fair   Executive Functions  Concentration:Fair  Attention Span:Fair  [place]  Language:Good   Psychomotor Activity  Psychomotor Activity:Psychomotor Activity: Restlessness   Assets  Assets:Communication Skills; Desire for Improvement; Physical Health; Resilience   Sleep  Sleep:Sleep: Fair   Physical Exam   Physical Exam Constitutional:      General: He is in acute distress.     Appearance: Normal appearance.  HENT:     Head: Normocephalic and atraumatic.  Eyes:     Extraocular Movements: Extraocular movements intact.  Pulmonary:     Effort: Pulmonary effort is normal.  Neurological:      Mental Status: He is alert.    Review of Systems  Constitutional: Positive for chills.  Gastrointestinal: Negative for diarrhea and nausea.  Musculoskeletal: Positive for joint pain and myalgias.  Psychiatric/Behavioral: Positive for suicidal ideas.  Blood pressure 110/70, pulse 72, temperature (!) 97.5 F (36.4 C), temperature source Oral, resp. rate 18, SpO2 98 %. There is no height or weight on file to calculate BMI.  Demographic Factors:  Low socioeconomic status, Living alone and Unemployed  Loss Factors: Financial problems/change in socioeconomic status and lost custody of kids  Historical Factors: Impulsivity  Risk Reduction Factors:   Sense of responsibility to family  Continued Clinical Symptoms:  Alcohol/Substance Abuse/Dependencies Previous Psychiatric Diagnoses and Treatments  Cognitive Features That Contribute To Risk:  Closed-mindedness and Thought constriction (tunnel vision)    Suicide Risk:  Mild:  Suicidal ideation of limited frequency, intensity, duration, and specificity.  There are no identifiable plans, no associated intent, mild dysphoria and related symptoms, good self-control (both objective and subjective assessment), few other risk factors, and identifiable protective factors, including available and accessible social support.  Plan Of Care/Follow-up recommendations:  Transfer to [facility]  Disposition: transfer to [place], MD 06/29/2020, 2:22 PM

## 2020-06-29 NOTE — ED Notes (Signed)
Pt resting in pull out bed. No s&s of distress observed. Safety maintained and will continue to monitor.

## 2020-06-29 NOTE — ED Notes (Signed)
Patient is alert and verbal. Patient voices passive SI with no intent. Patient is able to contract for safety on unit. Patient denies HI. Patient skin assessed and is intact with multiple tattoos. Patient searched and no contraband found.

## 2020-06-29 NOTE — ED Notes (Signed)
Pt pacing the unit

## 2020-06-29 NOTE — Plan of Care (Signed)
Patient has been experiencing withdrawal symptoms including increased anxiety, restlessness, abdominal cramps, body aching, dry mouth etc. Unable to rest on HS medications including Vistaril, Clonidine and Bentyl. . Presented to the medication window reporting that "I  can't take it anymore, I need medicine". Provider was notified: patient received additional Vistaril 25 mg, Trazodone 100 mg, and Robaxin. Currently in room and remains restless. Safety precautions reinforced.

## 2020-06-29 NOTE — Telephone Encounter (Signed)
Patient 's mother called to request location of where patient was admitted in hospital. Unable to give patient's mother information of location where patient admitted. Patient's mother reported she contacted [facility] and requested patients status. Patient's mother is going to contact behavioral health to attempt to get more information on patient's status.   Answer Assessment - Initial Assessment Questions 1. REASON FOR CALL or QUESTION: "What is your reason for calling today?" or "How can I best help you?" or "What question do you have that I can help answer?"     Patient mother requesting information about her son and where he is located to get information if son has been admitted to hospital.  Protocols used: [place]

## 2020-06-29 NOTE — BH Assessment (Signed)
Comprehensive Clinical Assessment (CCA) Note  06/29/2020 Steve Henry [id number]   Steve Henry is a 40 year old male who presents voluntary ad unaccompanied to [facility] Clinician asked the pt, "what brought you to the hospital?" Pt reported, having a hard time after loosing full custody of his kids to their mother because of drug addiction, his life is going down hill. Pt reported, he is suicidal with no plan "yet." Pt denies, previous suicide attempts. Pt reported, he can get any kind of weapon (guns, knives, etc.) Pt is currently homeless with depression symptoms. Pt denies, HI, AVH, self-injurious behaviors.   Pt reported, using a half a gram and a gram of Heroin and Fentanyl, everyday. Pt's UDS is positive for opiates. Pt's UDS is positive for Opiates, Amphetamines and Cocaine. Pt reported, experiencing diarrhea, chills, vomiting, abdominal pain and nausea since stop using Heroin and Fentanyl on Sunday (06/27/2020). Pt denies, being linked to OPT resources (medication management and/or counseling.) Pt denies, previous inpatient admissions.   Pt presents alert, disheveled with normal speech. Pt's mood, affect was depressed. Pt's thought content was appropriate to mood and circumstances. Pt's insight, judgement was poor. Pt reported, if discharged from [facility] he would hut himself.   Disposition: Steve Henry, PMHNP recommends pt to be admitted to [facility] for Continuous Observation. Disposition discussed with Steve Settle, PA. PA to discuss disposition with RN.   Diagnosis: Substance Induced Mood Disorder                    Opioid use Disorder, severe.   *Pt denies, having family, friend supports.*   Chief Complaint:  Chief Complaint  Patient presents with  . Emesis  . Abdominal Pain  . Chills  . Diarrhea  . Suicidal   Visit Diagnosis:    CCA Screening, Triage and Referral (STR)  Patient Reported Information How did you hear about [place]? Self  Referral name: No data recorded Referral  phone number: No data recorded  Whom do you see for routine medical problems? -- (unable to assess)  Practice/Facility Name: No data recorded Practice/Facility Phone Number: No data recorded Name of Contact: No data recorded Contact Number: No data recorded Contact Fax Number: No data recorded Prescriber Name: No data recorded Prescriber Address (if known): No data recorded  What Is the Reason for Your Visit/Call Today? detox  How Long Has This Been Causing You Problems? 1 wk - 1 month  What Do You Feel Would Help You the Most Today? Medication; Other (Comment) (detox)   Have You Recently Been in Any Inpatient Treatment (Hospital/Detox/Crisis Center/28-Day Program)? No  Name/Location of Program/Hospital:No data recorded How Long Were You There? No data recorded When Were You Discharged? No data recorded  Have You Ever Received Services From [facility] Before? No  Who Do You See at [facility]? No data recorded  Have You Recently Had Any Thoughts About Hurting Yourself? No  Are You Planning to Commit Suicide/Harm Yourself At This time? No   Have you Recently Had Thoughts About [place]? No  Explanation: No data recorded  Have You Used Any Alcohol or Drugs in the Past 24 Hours? No  How Long Ago Did You Use Drugs or Alcohol? No data recorded What Did You Use and How Much? No data recorded  Do You Currently Have a Therapist/Psychiatrist? No  Name of Therapist/Psychiatrist: No data recorded  Have You Been Recently Discharged From Any Office Practice or Programs? No  Explanation of Discharge From Practice/Program: No  data recorded    CCA Screening Triage Referral Assessment Type of Contact: Face-to-Face  Is this Initial or Reassessment? No data recorded Date Telepsych consult ordered in [facility]:  No data recorded Time Telepsych consult ordered in [facility]:  No data recorded  Patient Reported Information Reviewed? Yes  Patient Left Without Being Seen? No data  recorded Reason for Not Completing Assessment: No data recorded  Collateral Involvement: none   Does Patient Have a [place]? No data recorded Name and Contact of Legal Guardian: No data recorded If Minor and Not Living with Parent(s), Who has Custody? No data recorded Is CPS involved or ever been involved? Never  Is APS involved or ever been involved? Never   Patient Determined To Be At Risk for Harm To Self or Others Based on Review of Patient Reported Information or Presenting Complaint? No  Method: No data recorded Availability of Means: No data recorded Intent: No data recorded Notification Required: No data recorded Additional Information for Danger to Others Potential: No data recorded Additional Comments for Danger to Others Potential: No data recorded Are There Guns or Other Weapons in Your Home? No data recorded Types of Guns/Weapons: No data recorded Are These Weapons Safely Secured?                            No data recorded Who Could Verify You Are Able To Have These Secured: No data recorded Do You Have any Outstanding Charges, Pending Court Dates, Parole/Probation? No data recorded Contacted To Inform of Risk of Harm To Self or Others: No data recorded  Location of Assessment: [facility] Assessment Services   Does Patient Present under Involuntary Commitment? No  IVC Papers Initial File Date: No data recorded  [place] of Residence: [place]   Patient Currently Receiving the Following Services: CD--IOP (Intensive Chemical Dependency Program)   Determination of Need: Emergent (2 hours)   Options For Referral: Chemical Dependency Intensive Outpatient Therapy (CDIOP)     CCA Biopsychosocial Intake/Chief Complaint:  Per EDP note: "40yo male with history of narcotic dependence presents with concern for withdrawal from fentanyl and heroin with nausea, vomiting, diarrhea, abdominal pain and chills. Last used 2 days ago and symptoms began  today. Reports 3 episodes each of vomiting and diarrhea. Reports chills, body aches, diffuse abdominal pain.  Denies IV drug use.  Denies fevers.  Denies known COVID-19 exposures. He reports he does have suicidal ideation but no plan. He is the father of a 20 and 66 year old. He would like help for his SI and rehab."  Current Symptoms/Problems: Polysubstance use, depressive symptoms, passive suicidal ideations with no plan ("yet").   Patient Reported Schizophrenia/Schizoaffective Diagnosis in Past: No data recorded  Strengths: Not assessed.  Preferences: Not assessed.  Abilities: Not assessed.   Type of Services Patient Feels are Needed: Pt reported, he feels he will hurt himself if discharged from [facility].   Initial Clinical Notes/Concerns: No data recorded  Mental Health Symptoms Depression:  Irritability; Hopelessness; Worthlessness; Fatigue; Sleep (too much or little); Difficulty Concentrating (Isolation. Pt reported, he ate then he threw it up.)   Duration of Depressive symptoms: No data recorded  Mania:  None   Anxiety:   Worrying   Psychosis:  None   Duration of Psychotic symptoms: No data recorded  Trauma:  None   Obsessions:  None   Compulsions:  None   Inattention:  None   Hyperactivity/Impulsivity:  N/A  Oppositional/Defiant Behaviors:  None   Emotional Irregularity:  No data recorded  Other Mood/Personality Symptoms:  No data recorded   Mental Status Exam Appearance and self-care  Stature:  Small   Weight:  Average weight   Clothing:  Disheveled   Grooming:  Neglected   Cosmetic use:  None   Posture/gait:  Normal   Motor activity:  Not Remarkable   Sensorium  Attention:  Normal   Concentration:  Normal   Orientation:  X5   Recall/memory:  Defective in Immediate   Affect and Mood  Affect:  Depressed   Mood:  Depressed   Relating  Eye contact:  Normal   Facial expression:  Depressed   Attitude toward examiner:  Cooperative    Thought and Language  Speech flow: Normal   Thought content:  Appropriate to Mood and Circumstances   Preoccupation:  None   Hallucinations:  None   Organization:  No data recorded  [employer] of Knowledge:  Fair   Intelligence:  Average   Abstraction:  -- (UTA)   Judgement:  Poor   Reality Testing:  -- (UTA)   Insight:  Poor   Decision Making:  Impulsive   Social Functioning  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   CCA Employment/Education Employment/Work Situation: Employment / Work Situation Employment situation:  (Pending disability.) What is the longest time patient has a held a job?: Not assessed. Where was the patient employed at that time?: Not assessed.  Education: Education Is Patient Currently Attending School?: No   CCA Family/Childhood History Family and Relationship History: Family history Marital status: Single What is your sexual orientation?: Not assessed. Has your sexual activity been affected by drugs, alcohol, medication, or emotional stress?: Not assessed. Does patient have children?: Yes How many children?: 2 How is patient's relationship with their children?: Pt reported, he lost custody of his children because of his addiction. Pt reported, his childrens mother have custody of thier children.  Childhood History:  Childhood History By whom was/is the patient raised?:  (Not  assessed.) Additional childhood history information: Not assessed. Description of patient's relationship with caregiver when they were a child: Not assessed. Patient's description of current relationship with people who raised him/her: Not assessed. How were you disciplined when you got in trouble as a child/adolescent?: Not assessed. Does patient have siblings?: Yes Number of Siblings: 2 Description of patient's current relationship with siblings: Not assessed. Did patient suffer any verbal/emotional/physical/sexual abuse as a child?: No Has patient ever been sexually abused/assaulted/raped as an adolescent or adult?: No  Child/Adolescent Assessment:     CCA Substance Use Alcohol/Drug Use: Alcohol / Drug Use Pain Medications: See MAR Prescriptions: See MAR Over the Counter: See MAR History of alcohol / drug use?: Yes Withdrawal Symptoms: Diarrhea,Nausea / Vomiting,Cramps,Fever / Chills Substance #1 Name of Substance 1: Heroin and Fentanyl. 1 - Age of First Use: UTA 1 - Amount (size/oz): Pt reported, using a half a gram and  a gram, everyday. Pt's UDS is positive for opiates. 1 - Frequency: Everyday. 1 - Duration: Ongoing. 1 - Last Use / Amount: Sunday 06/27/2020) Substance #2 Name of Substance 2: Amphetamines. 2 - Age of First Use: UTA 2 - Amount (size/oz): UTA 2 - Frequency: UTA 2 - Duration: UTA 2 - Last Use / Amount: UTA Substance #3 Name of Substance 3: Cocaine. 3 - Age of First Use: UTA 3 - Amount (size/oz): UTA 3 - Frequency: UTA 3 - Duration: UTA 3 - Last Use / Amount: UTA     ASAM's:  Six Dimensions of Multidimensional Assessment  Dimension 1:  Acute Intoxication and/or Withdrawal Potential:      Dimension 2:  Biomedical Conditions and Complications:      Dimension 3:  Emotional, Behavioral, or Cognitive Conditions and Complications:     Dimension 4:  Readiness to Change:     Dimension 5:  Relapse, Continued use, or Continued Problem Potential:      Dimension 6:  Recovery/Living Environment:     ASAM Severity Score:    ASAM Recommended Level of Treatment:     Substance use Disorder (SUD)    Recommendations for Services/Supports/Treatments: Recommendations for Services/Supports/Treatments Recommendations For Services/Supports/Treatments: Other (Comment) ([facility] for Continuous Observation.)  DSM5 Diagnoses: Patient Active Problem List   Diagnosis Date Noted  . Lipoma   . Numbness 09/21/2015     Referrals to Alternative Service(s): Referred to Alternative Service(s):   Place:   Date:   Time:    Referred to Alternative Service(s):   Place:   Date:   Time:    Referred to Alternative Service(s):   Place:   Date:   Time:    Referred to Alternative Service(s):   Place:   Date:   Time:     Vertell Novak, [facility]  Comprehensive Clinical Assessment (CCA) Screening, Triage and Referral Note  06/29/2020 Steve Henry [id number]  Chief Complaint:  Chief Complaint  Patient presents with  . Emesis  . Abdominal Pain  . Chills  . Diarrhea  . Suicidal   Visit Diagnosis:   Patient Reported Information How did you hear about [place]? Self   Referral name: No data recorded  Referral phone number: No data recorded Whom do you see for routine medical problems? -- (unable to assess)   Practice/Facility Name: No data recorded  Practice/Facility Phone Number: No data recorded  Name of Contact: No data recorded  Contact Number: No data recorded  Contact Fax Number: No data recorded  Prescriber Name: No data recorded  Prescriber Address (if known): No data recorded What Is the Reason for Your Visit/Call Today? detox  How Long Has This Been Causing You Problems? 1 wk - 1 month  Have You Recently Been in Any Inpatient Treatment (Hospital/Detox/Crisis Center/28-Day Program)? No   Name/Location of Program/Hospital:No data recorded  How Long Were You There? No data recorded  When Were You Discharged? No data recorded Have You  Ever Received Services From [facility] Before? No   Who Do You See at [facility]? No data recorded Have You Recently Had Any Thoughts About Hurting Yourself? No   Are You Planning to Commit Suicide/Harm Yourself At This time?  No  Have you Recently Had Thoughts About [place]? No   Explanation: No data recorded Have You Used Any Alcohol or Drugs in the Past 24 Hours? No   How Long Ago Did You Use Drugs or Alcohol?  No data recorded  What Did You  Use and How Much? No data recorded What Do You Feel Would Help You the Most Today? Medication; Other (Comment) (detox)  Do You Currently Have a Therapist/Psychiatrist? No   Name of Therapist/Psychiatrist: No data recorded  Have You Been Recently Discharged From Any Office Practice or Programs? No   Explanation of Discharge From Practice/Program:  No data recorded    CCA Screening Triage Referral Assessment Type of Contact: Face-to-Face   Is this Initial or Reassessment? No data recorded  Date Telepsych consult ordered in [facility]:  No data recorded  Time Telepsych consult ordered in [facility]:  No data recorded Patient Reported Information Reviewed? Yes   Patient Left Without Being Seen? No data recorded  Reason for Not Completing Assessment: No data recorded Collateral Involvement: none  Does Patient Have a [place]? No data recorded  Name and Contact of Legal Guardian:  No data recorded If Minor and Not Living with Parent(s), Who has Custody? No data recorded Is CPS involved or ever been involved? Never  Is APS involved or ever been involved? Never  Patient Determined To Be At Risk for Harm To Self or Others Based on Review of Patient Reported Information or Presenting Complaint? No   Method: No data recorded  Availability of Means: No data recorded  Intent: No data recorded  Notification Required: No data recorded  Additional Information for Danger to Others Potential:  No data  recorded  Additional Comments for Danger to Others Potential:  No data recorded  Are There Guns or Other Weapons in Your Home?  No data recorded   Types of Guns/Weapons: No data recorded   Are These Weapons Safely Secured?                              No data recorded   Who Could Verify You Are Able To Have These Secured:    No data recorded Do You Have any Outstanding Charges, Pending Court Dates, Parole/Probation? No data recorded Contacted To Inform of Risk of Harm To Self or Others: No data recorded Location of Assessment: [facility] Assessment Services  Does Patient Present under Involuntary Commitment? No   IVC Papers Initial File Date: No data recorded  [place] of Residence: [place]  Patient Currently Receiving the Following Services: CD--IOP (Intensive Chemical Dependency Program)   Determination of Need: Emergent (2 hours)   Options For Referral: Chemical Dependency Intensive Outpatient Therapy (CDIOP)   Vertell Novak, [place], [place], [facility], [facility] Triage Specialist [phone]

## 2020-06-29 NOTE — Tx Team (Signed)
Initial Treatment Plan 06/29/2020 8:11 PM [name] [id number]    PATIENT STRESSORS: Financial difficulties Marital or family conflict Substance abuse Other: homeless   PATIENT STRENGTHS: Curator fund of knowledge Physical Health   PATIENT IDENTIFIED PROBLEMS: Substance abuse  Depression  Suicidal ideation  "Detox and get clean"  "Depression             DISCHARGE CRITERIA:  Improved stabilization in mood, thinking, and/or behavior Need for constant or close observation no longer present Reduction of life-threatening or endangering symptoms to within safe limits Verbal commitment to aftercare and medication compliance  PRELIMINARY DISCHARGE PLAN: Outpatient therapy  PATIENT/FAMILY INVOLVEMENT: This treatment plan has been presented to and reviewed with the patient, Steve Henry.  The patient and family have been given the opportunity to ask questions and make suggestions.  Windell Moment, RN 06/29/2020, 8:11 PM

## 2020-06-29 NOTE — ED Notes (Signed)
Breakfast given.  

## 2020-06-30 ENCOUNTER — Telehealth (HOSPITAL_COMMUNITY): Payer: Self-pay | Admitting: General Practice

## 2020-06-30 NOTE — Plan of Care (Signed)
Nurse discussed anxiety, depression and coping skills with patient.  

## 2020-06-30 NOTE — BHH Suicide Risk Assessment (Signed)
[facility] Admission Suicide Risk Assessment   Nursing information obtained from:  Patient Demographic factors:  Male,Access to firearms,Unemployed Current Mental Status:  Suicidal ideation indicated by patient Loss Factors:  Legal issues,Financial problems / change in socioeconomic status Historical Factors:  NA Risk Reduction Factors:  NA  Total Time spent with patient: 20 minutes Principal Problem: <principal problem not specified> Diagnosis:  Active Problems:   Substance induced mood disorder (HCC)  Subjective Data: 40 year old male presenting with polysusbtance abuse, withdrawal and depression and SI  Continued Clinical Symptoms:  Alcohol Use Disorder Identification Test Final Score (AUDIT): 0 The "Alcohol Use Disorders Identification Test", Guidelines for Use in Primary Care, Second Edition.  World Pharmacologist [facility]). Score between 0-7:  no or low risk or alcohol related problems. Score between 8-15:  moderate risk of alcohol related problems. Score between 16-19:  high risk of alcohol related problems. Score 20 or above:  warrants further diagnostic evaluation for alcohol dependence and treatment.   CLINICAL FACTORS:   Depression:   Comorbid alcohol abuse/dependence Hopelessness Impulsivity Alcohol/Substance Abuse/Dependencies   Musculoskeletal: Strength & Muscle Tone: within normal limits Gait & Station: normal Patient leans: N/A  Psychiatric Specialty Exam: Physical Exam  Review of Systems  Blood pressure 106/68, pulse 76, temperature 98.4 F (36.9 C), temperature source Oral, resp. rate 18, height 5\' 3"  (1.6 m), weight 61.5 kg, SpO2 100 %.Body mass index is 24 kg/m.  General Appearance: Disheveled  Eye Contact:  Fair  Speech:  Garbled  Volume:  Decreased  Mood:  Depressed  Affect:  Appropriate  Thought Process:  Coherent  Orientation:  Full (Time, Place, and Person)  Thought Content:  Logical  Suicidal Thoughts:  No  Homicidal Thoughts:  No  Memory:   Recent;   Poor  Judgement:  Impaired  Insight:  Fair  Psychomotor Activity:  Normal  Concentration:  Concentration: Fair  Recall:  [employer] of Knowledge:  Fair  Language:  Fair  Akathisia:  No  Handed:  Right  AIMS (if indicated):     Assets:  Desire for Improvement Resilience  ADL's:  Intact  Cognition:  WNL  Sleep:         COGNITIVE FEATURES THAT CONTRIBUTE TO RISK:  None    SUICIDE RISK:   Moderate:  Frequent suicidal ideation with limited intensity, and duration, some specificity in terms of plans, no associated intent, good self-control, limited dysphoria/symptomatology, some risk factors present, and identifiable protective factors, including available and accessible social support.  PLAN OF CARE: Continue care on inpatient basis  I certify that inpatient services furnished can reasonably be expected to improve the patient's condition.   Dixie Dials, MD 06/30/2020, 2:13 PM

## 2020-06-30 NOTE — Tx Team (Signed)
Interdisciplinary Treatment and Diagnostic Plan Update  06/30/2020 Time of Session: 9am Steve Henry MRN: [id number]  Principal Diagnosis: <principal problem not specified>  Secondary Diagnoses: Active Problems:   Substance induced mood disorder (HCC)   Current Medications:  Current Facility-Administered Medications  Medication Dose Route Frequency Provider Last Rate Last Admin  . acetaminophen (TYLENOL) tablet 650 mg  650 mg Oral Q6H PRN Antonieta Pert, MD      . alum & mag hydroxide-simeth (MAALOX/MYLANTA) 200-200-20 MG/5ML suspension 30 mL  30 mL Oral Q4H PRN Antonieta Pert, MD      . cloNIDine (CATAPRES) tablet 0.1 mg  0.1 mg Oral Q4H PRN Antonieta Pert, MD      . cloNIDine (CATAPRES) tablet 0.1 mg  0.1 mg Oral QID Antonieta Pert, MD   0.1 mg at 06/30/20 0859   Followed by  . [START ON 07/02/2020] cloNIDine (CATAPRES) tablet 0.1 mg  0.1 mg Oral BH-qamhs Clary, Marlane Mingle, MD       Followed by  . [START ON 07/04/2020] cloNIDine (CATAPRES) tablet 0.1 mg  0.1 mg Oral QAC breakfast Antonieta Pert, MD      . dicyclomine (BENTYL) tablet 20 mg  20 mg Oral Q6H PRN Antonieta Pert, MD   20 mg at 06/30/20 0913  . hydrOXYzine (ATARAX/VISTARIL) tablet 25 mg  25 mg Oral Q6H PRN Antonieta Pert, MD   25 mg at 06/30/20 0910  . loperamide (IMODIUM) capsule 2-4 mg  2-4 mg Oral PRN Antonieta Pert, MD      . magnesium hydroxide (MILK OF MAGNESIA) suspension 30 mL  30 mL Oral Daily PRN Antonieta Pert, MD      . methocarbamol (ROBAXIN) tablet 500 mg  500 mg Oral Q8H PRN Antonieta Pert, MD   500 mg at 06/30/20 0905  . naproxen (NAPROSYN) tablet 500 mg  500 mg Oral BID PRN Antonieta Pert, MD   500 mg at 06/30/20 0913  . nicotine (NICODERM CQ - dosed in mg/24 hours) patch 21 mg  21 mg Transdermal Daily Antonieta Pert, MD   21 mg at 06/30/20 0900  . ondansetron (ZOFRAN-ODT) disintegrating tablet 4 mg  4 mg Oral Q6H PRN Antonieta Pert, MD       PTA  Medications: Medications Prior to Admission  Medication Sig Dispense Refill Last Dose  . Buprenorphine HCl-Naloxone HCl 8-2 MG FILM Place 1 Film under the tongue in the morning and at bedtime. 11 days supply filled 12/15 (Patient not taking: Reported on 06/30/2020)   Not Taking at Unknown time    Patient Stressors: Financial difficulties Marital or family conflict Substance abuse Other: homeless  Patient Strengths: Wellsite geologist fund of knowledge Physical Health  Treatment Modalities: Medication Management, Group therapy, Case management,  1 to 1 session with clinician, Psychoeducation, Recreational therapy.   Physician Treatment Plan for Primary Diagnosis: <principal problem not specified> Long Term Goal(s):     Short Term Goals:    Medication Management: Evaluate patient's response, side effects, and tolerance of medication regimen.  Therapeutic Interventions: 1 to 1 sessions, Unit Group sessions and Medication administration.  Evaluation of Outcomes: Not Met  Physician Treatment Plan for Secondary Diagnosis: Active Problems:   Substance induced mood disorder (HCC)  Long Term Goal(s):     Short Term Goals:       Medication Management: Evaluate patient's response, side effects, and tolerance of medication regimen.  Therapeutic Interventions: 1 to 1 sessions, Unit Group  sessions and Medication administration.  Evaluation of Outcomes: Not Met   RN Treatment Plan for Primary Diagnosis: <principal problem not specified> Long Term Goal(s): Knowledge of disease and therapeutic regimen to maintain health will improve  Short Term Goals: Ability to demonstrate self-control, Ability to verbalize feelings will improve and Ability to identify and develop effective coping behaviors will improve  Medication Management: RN will administer medications as ordered by provider, will assess and evaluate patient's response and provide education to patient for prescribed  medication. RN will report any adverse and/or side effects to prescribing provider.  Therapeutic Interventions: 1 on 1 counseling sessions, Psychoeducation, Medication administration, Evaluate responses to treatment, Monitor vital signs and CBGs as ordered, Perform/monitor CIWA, COWS, AIMS and Fall Risk screenings as ordered, Perform wound care treatments as ordered.  Evaluation of Outcomes: Not Met   LCSW Treatment Plan for Primary Diagnosis: <principal problem not specified> Long Term Goal(s): Safe transition to appropriate next level of care at discharge, Engage patient in therapeutic group addressing interpersonal concerns.  Short Term Goals: Engage patient in aftercare planning with referrals and resources, Facilitate acceptance of mental health diagnosis and concerns and Facilitate patient progression through stages of change regarding substance use diagnoses and concerns  Therapeutic Interventions: Assess for all discharge needs, 1 to 1 time with Social worker, Explore available resources and support systems, Assess for adequacy in community support network, Educate family and significant other(s) on suicide prevention, Complete Psychosocial Assessment, Interpersonal group therapy.  Evaluation of Outcomes: Not Met   Progress in Treatment: Attending groups: No. Participating in groups: No. Taking medication as prescribed: Yes. Toleration medication: Yes. Family/Significant other contact made: No, will contact:  CSW will contact once consents are obtained Patient understands diagnosis: No. Discussing patient identified problems/goals with staff: Yes. Medical problems stabilized or resolved: Yes. Denies suicidal/homicidal ideation: Yes. Issues/concerns per patient self-inventory: No. Other: None  New problem(s) identified: No, Describe:  CSW will continue to assess  New Short Term/Long Term Goal(s):medication stabilization, elimination of SI thoughts, development of comprehensive  mental wellness plan.  Patient Goals:  "get better  Discharge Plan or Barriers: Patient recently admitted. CSW will continue to follow and assess for appropriate referrals and possible discharge planning.  Reason for Continuation of Hospitalization: Depression Medication stabilization Withdrawal symptoms  Estimated Length of Stay: 3-5 days   Attendees: Patient: Steve Henry 06/30/2020   Physician: Dr. Claris Gower 06/30/2020   Nursing:  06/30/2020  RN Care Manager: 06/30/2020  Social Worker: Toney Reil, [place] 06/30/2020   Recreational Therapist:  06/30/2020   Other:  06/30/2020   Other:  06/30/2020   Other: 06/30/2020     Scribe for Treatment Team: Mliss Fritz, Latanya Presser 06/30/2020 12:03 PM

## 2020-06-30 NOTE — Progress Notes (Signed)
Patient shared in group this evening that his day was rated as a 7 out of 10. He states that he is working on his sobriety and that he is no longer going through withdrawals. His goal for tomorrow is to stay sober and attend more of the groups.

## 2020-06-30 NOTE — Progress Notes (Signed)
Recreation Therapy Notes  Date: 1.19.22 Time: 0930 Location: [address] Dayroom  Group Topic: Stress Management  Goal Area(s) Addresses:  Patient will identify positive stress management techniques. Patient will identify benefits of using stress management post d/c.  Intervention: Stress Management  Activity: Meditation.  LRT played a meditation that focused on calming anxiety.  Patients were to listen and follow along as meditation played to fully engage.    Education:  Stress Management, Discharge Planning.   Education Outcome: Acknowledges Education  Clinical Observations/Feedback: Pt did not attend activity.    Victorino Sparrow, LRT/CTRS        Victorino Sparrow A 06/30/2020 11:14 AM

## 2020-06-30 NOTE — [facility] Group Notes (Signed)
LCSW Group Therapy Note  Type of Therapy/Topic: Group Therapy: Six Dimensions of Wellness  Participation Level: CSW provided worksheet to patient due to COVID. CSW offered to meet individually with patient as needed.   Description of Group:  This group will address the concept of wellness and the six concepts of wellness: occupational, physical, social, intellectual, spiritual, and emotional. Patients will be encouraged to process areas in their lives that are out of balance and identify reasons for remaining unbalanced. Patients will be encouraged to explore ways to practice healthy habits daily to attain better physical and mental health outcomes.  Therapeutic Goals:  1. Identify aspects of wellness that they are doing well.  2. Identify aspects of wellness that they would like to improve upon.  3. Identify one action they can take to improve an aspect of wellness in their lives.  Summary of Patient Progress:  Therapeutic Modalities: Cognitive Behavioral Therapy Solution-Focused Therapy Relapse Prevention  Asencion Guisinger, LCSWA Clinicial Social Worker [facility] 

## 2020-06-30 NOTE — Progress Notes (Signed)
D:  Patient denied SI and HI, contracts for safety.  Denied A/V hallucinations.  Denied pain. A:  Medications administered per MD orders.  Emotional support and encouragement given patient. R:  Safety maintained with 15 minute checks.  

## 2020-06-30 NOTE — [facility] Counselor (Signed)
CSW attempted to complete PSA assessment with pt but he politely asked her to come back at a later time due to him not feeling well from withdrawal symptoms. CSW will make another attempt at a later time.   Toney Reil, [place] Worker [employer]

## 2020-06-30 NOTE — Progress Notes (Signed)
Pt presents sad but pleasant upon interaction. He denied SI/HI/AVH/self harm thoughts or w/d's r/t substance use. He reports feeling "much better" but still depressed over the fact that he lost custody of his children over "selfish acts." He is med compliant, was out of his room for snacks and interacted well with staff. No unsafe behavior noted thus far. Q15 minute observations maintained for safety and support provided as needed.   06/30/20 2100  Psych Admission Type (Psych Patients Only)  Admission Status Voluntary  Psychosocial Assessment  Patient Complaints Worrying  Eye Contact Avoids  Facial Expression Sad  Affect Depressed  Speech Logical/coherent  Interaction Forwards little;Guarded  Motor Activity Other (Comment) (Unremarkable)  Appearance/Hygiene Disheveled  Behavior Characteristics Cooperative;Calm  Mood Depressed  Thought Process  Coherency WDL  Content WDL  Delusions None reported or observed  Perception WDL  Hallucination None reported or observed  Judgment Poor  Confusion None  Danger to Self  Current suicidal ideation? Denies  Self-Injurious Behavior No self-injurious ideation or behavior indicators observed or expressed   Agreement Not to Harm Self Yes  Description of Agreement Verbally contracted for safety  Danger to Others  Danger to Others None reported or observed

## 2020-06-30 NOTE — Telephone Encounter (Signed)
Care Management - Follow Up [facility] Discharges   Patient was accepted to a psychiatric inpatient hospital at [facility].

## 2020-06-30 NOTE — H&P (Signed)
Psychiatric Admission Assessment Adult  Patient Identification: Steve Henry MRN:  [id number] Date of Evaluation:  06/30/2020 Chief Complaint:  Substance induced mood disorder ([place]) [F19.94] Principal Diagnosis: <principal problem not specified> Diagnosis:  Active Problems:   Substance induced mood disorder ([place])  History of Present Illness:    As per admitting clinician"   Subjective: Patient interviewed this AM in the observation area. Patient noted to be writhing in bed d/t opiate withdrawal, appearing in moderate distress. He reports SI, unable to contract for safety. Denies HI/AVH. He reports opiate withdrawal sx of chills, body aches, abdominal pain, diaphoresis. Denies diarrhea. Pt irritable, puts covers over his head and stops asking questions. Informed patient of PRN medications ordered as well as recommendation for inpatient admission as unable to contract for safety. Pt verbalized undestanding.  Stay Summary:  Steve Henry is a 40 y.o. male who presented to [facility] requesting assistance for heroin/fentanyl withdrawal. He was transferred to [facility] for treatment and stabilization.   On evaluation, patient is alert and oriented x 4. He is pleasant and cooperative. Speech is clear and coherent. He reports that he has been having a hard time since losing custody of his children due to substance abuse. He reports using 0.5 gm to 1 gm daily of heroin/fentanyl. Reports last use 2 days ago. Denies use of alcohol and other substances. UDS positive for opiates, amphetamines, and cocaine. BAL <10. He reported in the ED that he was having nausea, vomiting, diarrhea, and chills. He states that he has not any any vomiting or diarrhea after arrival to the ED. He was given phenergan IV, bentyl IM, robaxin IV, imodium PO, zofran IV, and toradol IV while in the ED. He reports that he continues to have chills. He is shaking thouhgout the assessment. His thought process is coherent and thought  content is logical. He denies auditory and visual hallucinations. No indication that he is responding to internal stimuli. No evidence of delusional thought content. When asked about SI, he states "not yet." He denies homicidal ideations.  Patient admitted for overnight observation. The following morning, patient reported continued SI and was unable to contract for safety (see above for additional details) and recommended inpatient admission. Pt accepted by [facility]"   Associated Signs/Symptoms: Depression Symptoms:  depressed mood, anhedonia, hypersomnia, fatigue, feelings of worthlessness/guilt, hopelessness, impaired memory, recurrent thoughts of death, suicidal thoughts without plan, loss of energy/fatigue, disturbed sleep, decreased appetite, Duration of Depression Symptoms: No data recorded (Hypo) Manic Symptoms:  NA Anxiety Symptoms:  Social Anxiety, Psychotic Symptoms:  NA Duration of Psychotic Symptoms: No data recorded PTSD Symptoms: NA Total Time spent with patient: 30 minutes  Past Psychiatric History: Notable for poly susbtance abuse  Is the patient at risk to self? Yes.    Has the patient been a risk to self in the past 6 months? Yes.    Has the patient been a risk to self within the distant past? Yes.    Is the patient a risk to others? No.  Has the patient been a risk to others in the past 6 months? No.  Has the patient been a risk to others within the distant past? No.   Prior Inpatient Therapy:   Prior Outpatient Therapy:    Alcohol Screening: 1. How often do you have a drink containing alcohol?: Never 2. How many drinks containing alcohol do you have on a typical day when you are drinking?: 1 or 2 3. How often do you have six or  more drinks on one occasion?: Never AUDIT-C Score: 0 9. Have you or someone else been injured as a result of your drinking?: No 10. Has a relative or friend or a doctor or another health worker been concerned about your  drinking or suggested you cut down?: No Alcohol Use Disorder Identification Test Final Score (AUDIT): 0 Alcohol Brief Interventions/Follow-up: AUDIT Score <7 follow-up not indicated Substance Abuse History in the last 12 months:  Yes.   Consequences of Substance Abuse: Medical Consequences:  withdrawal Family Consequences:  . Previous Psychotropic Medications: Yes  Psychological Evaluations: Yes  Past Medical History:  Past Medical History:  Diagnosis Date  . Chronic back pain   . Chronic hand pain   . DDD (degenerative disc disease), cervical   . De Quervain's tenosynovitis   . Drug-seeking behavior   . Narcotic dependence ([place])     Past Surgical History:  Procedure Laterality Date  . MASS EXCISION N/A 03/13/2018   Procedure: EXCISION LIPOMA SCALP;  Surgeon: Aviva Signs, MD;  Location: [facility];  Service: General;  Laterality: N/A;  pt knows to arrive at 6:15  . SPINAL FUSION     Family History:  Family History  Family history unknown: Yes   Family Psychiatric  History: Unknown Tobacco Screening: Have you used any form of tobacco in the last 30 days? (Cigarettes, Smokeless Tobacco, Cigars, and/or Pipes): Yes Tobacco use, Select all that apply: 5 or more cigarettes per day Are you interested in Tobacco Cessation Medications?: Yes, will notify MD for an order Counseled patient on smoking cessation including recognizing danger situations, developing coping skills and basic information about quitting provided: Refused/Declined practical counseling Social History:  Social History   Substance and Sexual Activity  Alcohol Use No     Social History   Substance and Sexual Activity  Drug Use Yes   Comment: heroin and Fentanyl    Additional Social History:                           Allergies:   Allergies  Allergen Reactions  . Tramadol Hives  . Latex Rash   Lab Results:  Results for orders placed or performed during the hospital encounter of 06/28/20 (from the  past 48 hour(s))  Comprehensive metabolic panel     Status: Abnormal   Collection Time: 06/28/20  7:28 PM  Result Value Ref Range   Sodium 136 135 - 145 mmol/L   Potassium 4.1 3.5 - 5.1 mmol/L   Chloride 99 98 - 111 mmol/L   CO2 26 22 - 32 mmol/L   Glucose, Bld 108 (H) 70 - 99 mg/dL    Comment: Glucose reference range applies only to samples taken after fasting for at least 8 hours.   BUN 17 6 - 20 mg/dL   Creatinine, Ser 1.03 0.61 - 1.24 mg/dL   Calcium 9.4 8.9 - 10.3 mg/dL   Total Protein 8.4 (H) 6.5 - 8.1 g/dL   Albumin 4.4 3.5 - 5.0 g/dL   AST 17 15 - 41 U/L   ALT 13 0 - 44 U/L   Alkaline Phosphatase 107 38 - 126 U/L   Total Bilirubin 0.7 0.3 - 1.2 mg/dL   GFR, Estimated >60 >60 mL/min    Comment: (NOTE) Calculated using the CKD-EPI Creatinine Equation (2021)    Anion gap 11 5 - 15    Comment: Performed at [facility], [place] [address], [place], [place] [address]  cbc  Status: None   Collection Time: 06/28/20  7:28 PM  Result Value Ref Range   WBC 6.7 4.0 - 10.5 K/uL   RBC 5.46 4.22 - 5.81 MIL/uL   Hemoglobin 15.3 13.0 - 17.0 g/dL   HCT 46.4 39.0 - 52.0 %   MCV 85.0 80.0 - 100.0 fL   MCH 28.0 26.0 - 34.0 pg   MCHC 33.0 30.0 - 36.0 g/dL   RDW 12.1 11.5 - 15.5 %   Platelets 395 150 - 400 K/uL   nRBC 0.0 0.0 - 0.2 %    Comment: Performed at [facility], [place] [address] [place], [place] [address]  Ethanol     Status: None   Collection Time: 06/28/20  7:29 PM  Result Value Ref Range   Alcohol, Ethyl (B) <10 <10 mg/dL    Comment: (NOTE) Lowest detectable limit for serum alcohol is 10 mg/dL.  For medical purposes only. Performed at [facility], [place] [address], [place], [place] [id number]   Salicylate level     Status: Abnormal   Collection Time: 06/28/20  7:29 PM  Result Value Ref Range   Salicylate Lvl [id number] (L) 7.0 - 30.0 mg/dL    Comment: Performed at [facility], [place] [address],  [place], [place] [address]  Acetaminophen level     Status: Abnormal   Collection Time: 06/28/20  7:29 PM  Result Value Ref Range   Acetaminophen (Tylenol), Serum <10 (L) 10 - 30 ug/mL    Comment: (NOTE) Therapeutic concentrations vary significantly. A range of 10-30 ug/mL  may be an effective concentration for many patients. However, some  are best treated at concentrations outside of this range. Acetaminophen concentrations >150 ug/mL at 4 hours after ingestion  and >50 ug/mL at 12 hours after ingestion are often associated with  toxic reactions.  Performed at [facility], [place] [address] [place], [place] [address]   Rapid urine drug screen (hospital performed)     Status: Abnormal   Collection Time: 06/28/20  9:30 PM  Result Value Ref Range   Opiates POSITIVE (A) NONE DETECTED   Cocaine POSITIVE (A) NONE DETECTED   Benzodiazepines NONE DETECTED NONE DETECTED   Amphetamines POSITIVE (A) NONE DETECTED   Tetrahydrocannabinol NONE DETECTED NONE DETECTED   Barbiturates NONE DETECTED NONE DETECTED    Comment: (NOTE) DRUG SCREEN FOR MEDICAL PURPOSES ONLY.  IF CONFIRMATION IS NEEDED FOR ANY PURPOSE, NOTIFY LAB WITHIN 5 DAYS.  LOWEST DETECTABLE LIMITS FOR URINE DRUG SCREEN Drug Class                     Cutoff (ng/mL) Amphetamine and metabolites    1000 Barbiturate and metabolites    200 Benzodiazepine                 [id number] Tricyclics and metabolites     300 Opiates and metabolites        300 Cocaine and metabolites        300 THC                            50 Performed at [facility], [place] [address], [place], [place] [address]   Resp Panel by RT-PCR (Flu A&B, Covid) Nasopharyngeal Swab     Status: None   Collection Time: 06/28/20 10:39 PM   Specimen: Nasopharyngeal Swab; Nasopharyngeal(NP) swabs in vial transport medium  Result Value Ref Range  SARS Coronavirus 2 by RT PCR NEGATIVE NEGATIVE    Comment: (NOTE) SARS-CoV-2 target nucleic  acids are NOT DETECTED.  The SARS-CoV-2 RNA is generally detectable in upper respiratory specimens during the acute phase of infection. The lowest concentration of SARS-CoV-2 viral copies this assay can detect is 138 copies/mL. A negative result does not preclude SARS-Cov-2 infection and should not be used as the sole basis for treatment or other patient management decisions. A negative result may occur with  improper specimen collection/handling, submission of specimen other than nasopharyngeal swab, presence of viral mutation(s) within the areas targeted by this assay, and inadequate number of viral copies(<138 copies/mL). A negative result must be combined with clinical observations, patient history, and epidemiological information. The expected result is Negative.  Fact Sheet for Patients:  EntrepreneurPulse.com.au  Fact Sheet for Healthcare Providers:  IncredibleEmployment.be  This test is no t yet approved or cleared by the [place] FDA and  has been authorized for detection and/or diagnosis of SARS-CoV-2 by FDA under an Emergency Use Authorization (EUA). This EUA will remain  in effect (meaning this test can be used) for the duration of the COVID-19 declaration under Section 564(b)(1) of the Act, 21 U.S.C.section 360bbb-3(b)(1), unless the authorization is terminated  or revoked sooner.       Influenza A by PCR NEGATIVE NEGATIVE   Influenza B by PCR NEGATIVE NEGATIVE    Comment: (NOTE) The Xpert Xpress SARS-CoV-2/FLU/RSV plus assay is intended as an aid in the diagnosis of influenza from Nasopharyngeal swab specimens and should not be used as a sole basis for treatment. Nasal washings and aspirates are unacceptable for Xpert Xpress SARS-CoV-2/FLU/RSV testing.  Fact Sheet for Patients: EntrepreneurPulse.com.au  Fact Sheet for Healthcare Providers: IncredibleEmployment.be  This test is not  yet approved or cleared by the [place] FDA and has been authorized for detection and/or diagnosis of SARS-CoV-2 by FDA under an Emergency Use Authorization (EUA). This EUA will remain in effect (meaning this test can be used) for the duration of the COVID-19 declaration under Section 564(b)(1) of the Act, 21 U.S.C. section 360bbb-3(b)(1), unless the authorization is terminated or revoked.  Performed at [facility], [place] [address] [place], [place] [address]     Blood Alcohol level:  Lab Results  Component Value Date   ETH <10 06/28/2020   ETH <10 [id number]    Metabolic Disorder Labs:  No results found for: HGBA1C, MPG No results found for: PROLACTIN No results found for: CHOL, TRIG, HDL, CHOLHDL, VLDL, LDLCALC  Current Medications: Current Facility-Administered Medications  Medication Dose Route Frequency Provider Last Rate Last Admin  . acetaminophen (TYLENOL) tablet 650 mg  650 mg Oral Q6H PRN Sharma Covert, MD      . alum & mag hydroxide-simeth (MAALOX/MYLANTA) 200-200-20 MG/5ML suspension 30 mL  30 mL Oral Q4H PRN Sharma Covert, MD      . cloNIDine (CATAPRES) tablet 0.1 mg  0.1 mg Oral Q4H PRN Sharma Covert, MD      . cloNIDine (CATAPRES) tablet 0.1 mg  0.1 mg Oral QID Sharma Covert, MD   0.1 mg at 06/30/20 1211   Followed by  . [START ON 07/02/2020] cloNIDine (CATAPRES) tablet 0.1 mg  0.1 mg Oral BH-qamhs Clary, Cordie Grice, MD       Followed by  . [START ON 07/04/2020] cloNIDine (CATAPRES) tablet 0.1 mg  0.1 mg Oral QAC breakfast Sharma Covert, MD      . dicyclomine (BENTYL) tablet 20  mg  20 mg Oral Q6H PRN Sharma Covert, MD   20 mg at 06/30/20 0913  . hydrOXYzine (ATARAX/VISTARIL) tablet 25 mg  25 mg Oral Q6H PRN Sharma Covert, MD   25 mg at 06/30/20 0910  . loperamide (IMODIUM) capsule 2-4 mg  2-4 mg Oral PRN Sharma Covert, MD      . magnesium hydroxide (MILK OF MAGNESIA) suspension 30 mL  30 mL Oral Daily  PRN Sharma Covert, MD      . methocarbamol (ROBAXIN) tablet 500 mg  500 mg Oral Q8H PRN Sharma Covert, MD   500 mg at 06/30/20 0905  . naproxen (NAPROSYN) tablet 500 mg  500 mg Oral BID PRN Sharma Covert, MD   500 mg at 06/30/20 0913  . nicotine (NICODERM CQ - dosed in mg/24 hours) patch 21 mg  21 mg Transdermal Daily Sharma Covert, MD   21 mg at 06/30/20 0900  . ondansetron (ZOFRAN-ODT) disintegrating tablet 4 mg  4 mg Oral Q6H PRN Sharma Covert, MD       PTA Medications: Medications Prior to Admission  Medication Sig Dispense Refill Last Dose  . Buprenorphine HCl-Naloxone HCl 8-2 MG FILM Place 1 Film under the tongue in the morning and at bedtime. 11 days supply filled 12/15 (Patient not taking: Reported on 06/30/2020)   Not Taking at Unknown time    Musculoskeletal: Strength & Muscle Tone: within normal limits Gait & Station: normal Patient leans: N/A  Psychiatric Specialty Exam: Physical Exam  Review of Systems  Blood pressure 106/68, pulse 76, temperature 98.4 F (36.9 C), temperature source Oral, resp. rate 18, height 5\' 3"  (1.6 m), weight 61.5 kg, SpO2 100 %.Body mass index is 24 kg/m.  General Appearance: Disheveled  Eye Contact:  Minimal  Speech:  Slow  Volume:  Decreased  Mood:  Depressed  Affect:  Congruent  Thought Process:  Coherent  Orientation:  Full (Time, Place, and Person)  Thought Content:  Logical  Suicidal Thoughts:  Yes.  without intent/plan  Homicidal Thoughts:  No  Memory:  Recent;   Fair  Judgement:  Impaired  Insight:  Fair  Psychomotor Activity:  Decreased  Concentration:  Concentration: Fair  Recall:  [employer] of Knowledge:  Fair  Language:  Fair  Akathisia:  No  Handed:  Right  AIMS (if indicated):     Assets:  Desire for Improvement  ADL's:  Intact  Cognition:  WNL  Sleep:       Treatment Plan Summary: Daily contact with patient to assess and evaluate symptoms and progress in treatment and Medication  management  Patient spent most of the day in bed, had minimal complaints, and took PRN medication to treat his withdrawal.   Start Lexapro 10mg  po QAM fro mood Continue with Opiate Withdrawal protocol Patient was formerly receiving Suboxone prior to relapse, a call was placed to outpatient to confirm reinstating of outpatient care prior to making decisions about suboxone treatment.    Observation Level/Precautions:  15 minute checks  Laboratory:  CBC Chemistry Profile  Psychotherapy:    Medications:    Consultations:    Discharge Concerns:    Estimated LOS:  Other:     Physician Treatment Plan for Primary Diagnosis: <principal problem not specified> Long Term Goal(s): Improvement in symptoms so as ready for discharge  Short Term Goals: Ability to identify changes in lifestyle to reduce recurrence of condition will improve, Ability to verbalize feelings will improve, Ability to  disclose and discuss suicidal ideas, Ability to demonstrate self-control will improve, Ability to identify and develop effective coping behaviors will improve, Ability to maintain clinical measurements within normal limits will improve, Compliance with prescribed medications will improve and Ability to identify triggers associated with substance abuse/mental health issues will improve  Physician Treatment Plan for Secondary Diagnosis: Active Problems:   Substance induced mood disorder ([place])  Long Term Goal(s): Improvement in symptoms so as ready for discharge  Short Term Goals: Ability to identify changes in lifestyle to reduce recurrence of condition will improve, Ability to verbalize feelings will improve, Ability to disclose and discuss suicidal ideas, Ability to demonstrate self-control will improve, Ability to identify and develop effective coping behaviors will improve, Ability to maintain clinical measurements within normal limits will improve, Compliance with prescribed medications will improve and  Ability to identify triggers associated with substance abuse/mental health issues will improve  I certify that inpatient services furnished can reasonably be expected to improve the patient's condition.    Dixie Dials, MD 1/19/20222:26 PM

## 2020-07-01 DIAGNOSIS — F321 Major depressive disorder, single episode, moderate: Secondary | ICD-10-CM

## 2020-07-01 MED ORDER — CLONIDINE HCL 0.1 MG PO TABS
0.1000 mg | ORAL_TABLET | Freq: Three times a day (TID) | ORAL | Status: DC
  Administered 2020-07-01 – 2020-07-03 (×7): 0.1 mg via ORAL
  Filled 2020-07-01 (×14): qty 1

## 2020-07-01 MED ORDER — CLONIDINE HCL 0.1 MG PO TABS
0.1000 mg | ORAL_TABLET | Freq: Once | ORAL | Status: AC
  Administered 2020-07-01: 0.1 mg via ORAL
  Filled 2020-07-01 (×2): qty 1

## 2020-07-01 NOTE — Progress Notes (Signed)
   07/01/20 1000  Psych Admission Type (Psych Patients Only)  Admission Status Voluntary  Psychosocial Assessment  Patient Complaints Worrying;Depression;Irritability  Eye Contact Brief  Facial Expression Sad  Affect Depressed  Speech Logical/coherent  Interaction Guarded  Motor Activity Slow  Appearance/Hygiene Disheveled  Behavior Characteristics Cooperative  Mood Depressed;Anxious  Thought Process  Coherency WDL  Content WDL  Delusions None reported or observed  Perception WDL  Hallucination None reported or observed  Judgment Poor  Confusion None  Danger to Self  Current suicidal ideation? Passive  Self-Injurious Behavior No self-injurious ideation or behavior indicators observed or expressed   Agreement Not to Harm Self Yes  Description of Agreement verbal agreement to not harm self  Danger to Others  Danger to Others None reported or observed  Pt was visible on the unit.  Interacting with peers and peers.  He reported appetite is improving and sleep is fair.  Self inventory completed with 8/10 for depression, hopelessness 9/10 and 10/10 for anxiety.  Support and encouragement provided.  Q 15 minute checks for safety maintained.
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
## 2020-07-01 NOTE — Progress Notes (Signed)
[facility] MD Progress Note  07/01/2020 11:17 AM Steve Henry  MRN:  [id number] Subjective:  40 year old male presenting with Depression, si and polysubstance abuse. Principal Problem: Major depressive disorder, single episode, moderate (HCC) Diagnosis: Principal Problem:   Major depressive disorder, single episode, moderate (HCC) Active Problems:   Substance induced mood disorder ([place])  Total Time spent with patient: 30 minutes  Daily Note:  Patient reports that from a physical perspective he is feeling somewhat better although still experiencing some withdrawal symptoms. He was encouraged to ask nurse for PRN medications if the needs arise.  He denies any adverse effects from the Lexapro medication, states that his mood is still low and that he has thoughts of SI. He is agreeable to continue treatment with plan to uptitrate dose for tomorrow morning.  Patient states that he is no longer interested in replacement therapy. He feels that he would like to continue forward with detox, and is hopeful to get into a substance abuse program.    Past Medical History:  Past Medical History:  Diagnosis Date  . Chronic back pain   . Chronic hand pain   . DDD (degenerative disc disease), cervical   . De Quervain's tenosynovitis   . Drug-seeking behavior   . Narcotic dependence ([place])     Past Surgical History:  Procedure Laterality Date  . MASS EXCISION N/A 03/13/2018   Procedure: EXCISION LIPOMA SCALP;  Surgeon: Aviva Signs, MD;  Location: [facility];  Service: General;  Laterality: N/A;  pt knows to arrive at 6:15  . SPINAL FUSION     Family History:  Family History  Family history unknown: Yes    Social History:  Social History   Substance and Sexual Activity  Alcohol Use No     Social History   Substance and Sexual Activity  Drug Use Yes   Comment: heroin and Fentanyl    Social History   Socioeconomic History  . Marital status: Legally Separated    Spouse name: Not on  file  . Number of children: Not on file  . Years of education: Not on file  . Highest education level: Not on file  Occupational History  . Not on file  Tobacco Use  . Smoking status: Current Every Day Smoker    Packs/day: 0.50    Years: 16.00    Pack years: 8.00    Types: Cigarettes  . Smokeless tobacco: Never Used  Vaping Use  . Vaping Use: Never used  Substance and Sexual Activity  . Alcohol use: No  . Drug use: Yes    Comment: heroin and Fentanyl  . Sexual activity: Yes    Birth control/protection: None  Other Topics Concern  . Not on file  Social History Narrative  . Not on file   Social Determinants of Health   Financial Resource Strain: Not on file  Food Insecurity: Not on file  Transportation Needs: Not on file  Physical Activity: Not on file  Stress: Not on file  Social Connections: Not on file   Additional Social History:                         Sleep: Fair  Appetite:  Fair  Current Medications: Current Facility-Administered Medications  Medication Dose Route Frequency Provider Last Rate Last Admin  . acetaminophen (TYLENOL) tablet 650 mg  650 mg Oral Q6H PRN Sharma Covert, MD      . alum & mag hydroxide-simeth (MAALOX/MYLANTA)  200-200-20 MG/5ML suspension 30 mL  30 mL Oral Q4H PRN Sharma Covert, MD      . cloNIDine (CATAPRES) tablet 0.1 mg  0.1 mg Oral Q4H PRN Sharma Covert, MD      . cloNIDine (CATAPRES) tablet 0.1 mg  0.1 mg Oral TID Shreshta Medley, Dorene Ar, MD      . dicyclomine (BENTYL) tablet 20 mg  20 mg Oral Q6H PRN Sharma Covert, MD   20 mg at 06/30/20 0913  . hydrOXYzine (ATARAX/VISTARIL) tablet 25 mg  25 mg Oral Q6H PRN Sharma Covert, MD   25 mg at 07/01/20 0020  . loperamide (IMODIUM) capsule 2-4 mg  2-4 mg Oral PRN Sharma Covert, MD      . magnesium hydroxide (MILK OF MAGNESIA) suspension 30 mL  30 mL Oral Daily PRN Sharma Covert, MD      . methocarbamol (ROBAXIN) tablet 500 mg  500 mg Oral Q8H PRN  Sharma Covert, MD   500 mg at 06/30/20 0905  . naproxen (NAPROSYN) tablet 500 mg  500 mg Oral BID PRN Sharma Covert, MD   500 mg at 06/30/20 0913  . nicotine (NICODERM CQ - dosed in mg/24 hours) patch 21 mg  21 mg Transdermal Daily Sharma Covert, MD   21 mg at 07/01/20 0816  . ondansetron (ZOFRAN-ODT) disintegrating tablet 4 mg  4 mg Oral Q6H PRN Sharma Covert, MD        Lab Results: No results found for this or any previous visit (from the past 48 hour(s)).  Blood Alcohol level:  Lab Results  Component Value Date   ETH <10 06/28/2020   ETH <10 01/75/1025    Metabolic Disorder Labs: No results found for: HGBA1C, MPG No results found for: PROLACTIN No results found for: CHOL, TRIG, HDL, CHOLHDL, VLDL, LDLCALC  Physical Findings: AIMS: Facial and Oral Movements Muscles of Facial Expression: None, normal Lips and Perioral Area: None, normal Jaw: None, normal Tongue: None, normal,Extremity Movements Upper (arms, wrists, hands, fingers): None, normal Lower (legs, knees, ankles, toes): None, normal, Trunk Movements Neck, shoulders, hips: None, normal, Overall Severity Severity of abnormal movements (highest score from questions above): None, normal Incapacitation due to abnormal movements: None, normal Patient's awareness of abnormal movements (rate only patient's report): No Awareness, Dental Status Current problems with teeth and/or dentures?: No Does patient usually wear dentures?: No  CIWA:    COWS:  COWS Total Score: 2  Musculoskeletal: Strength & Muscle Tone: within normal limits Gait & Station: normal Patient leans: N/A  Psychiatric Specialty Exam: Physical Exam HENT:     Head: Normocephalic.     Mouth/Throat:     Mouth: Mucous membranes are moist.  Eyes:     Pupils: Pupils are equal, round, and reactive to light.  Musculoskeletal:        General: Normal range of motion.  Skin:    General: Skin is warm.  Neurological:     General: No focal  deficit present.     Mental Status: He is alert.     Review of Systems  Constitutional: Positive for chills and fatigue.  Eyes: Negative for discharge.  Cardiovascular: Negative for chest pain.  Gastrointestinal: Negative for abdominal distention.  Genitourinary: Negative for difficulty urinating.  Psychiatric/Behavioral: Negative for agitation.    Blood pressure 118/81, pulse 79, temperature 98.6 F (37 C), temperature source Oral, resp. rate 18, height 5\' 3"  (1.6 m), weight 61.5 kg, SpO2 100 %.Body mass index  is 24 kg/m.  General Appearance: Casual  Eye Contact:  Good  Speech:  Slow  Volume:  Decreased  Mood:  Depressed and Dysphoric  Affect:  Congruent  Thought Process:  Coherent  Orientation:  Full (Time, Place, and Person)  Thought Content:  Logical  Suicidal Thoughts:  Yes.  without intent/plan  Homicidal Thoughts:  No  Memory:  Recent;   Fair  Judgement:  Fair  Insight:  Fair  Psychomotor Activity:  Normal  Concentration:  Concentration: Fair  Recall:  [employer] of Knowledge:  Fair  Language:  Fair  Akathisia:  No  Handed:  Right  AIMS (if indicated):     Assets:  Desire for Improvement Resilience  ADL's:  Intact  Cognition:  WNL  Sleep:  Number of Hours: 5     Treatment Plan Summary: Daily contact with patient to assess and evaluate symptoms and progress in treatment and Medication management   40 year old male with history of polysubstance abuse, presenting with depressed mood. So far patient has been in good behavioral control and shows desire to come off of drugs and obtain psychiatric treatment. His SI is still persisting at this time. Diagnosis remains MDD without psychotic features.    Continue with Lexapro, dose will be increased to 20 mg qam starting tomorrorw morning  Continue with Withdrawal protocol, Clonidine 0.1 mg TID PRN Mylanta, Imodium, Bentyl, Robaxin -Dispo planning: rehab   Dixie Dials, MD 07/01/2020, 11:17 AM

## 2020-07-01 NOTE — Progress Notes (Signed)
Pt noted standing up at the nurses station, laughing and joking with staff and peers.  He was animated in his interactions.  Affect bright.  He appears to be in no physical distress.

## 2020-07-02 MED ORDER — ESCITALOPRAM OXALATE 10 MG PO TABS
10.0000 mg | ORAL_TABLET | Freq: Every day | ORAL | Status: DC
  Administered 2020-07-02 – 2020-07-07 (×5): 10 mg via ORAL
  Filled 2020-07-02 (×8): qty 1

## 2020-07-02 MED ORDER — TRAZODONE HCL 100 MG PO TABS
100.0000 mg | ORAL_TABLET | Freq: Every evening | ORAL | Status: DC | PRN
  Administered 2020-07-02 (×2): 100 mg via ORAL
  Filled 2020-07-02 (×2): qty 1

## 2020-07-02 MED ORDER — MELATONIN 3 MG PO TABS
3.0000 mg | ORAL_TABLET | Freq: Every day | ORAL | Status: DC
  Administered 2020-07-02: 3 mg via ORAL
  Filled 2020-07-02 (×3): qty 1

## 2020-07-02 NOTE — [facility] Counselor (Signed)
CSW completed and faxed referrals to [facility] for possible rehab placement.   [employer] authorization #[id number] and is valid from 07/02/20 to 07/08/20.    Darletta Moll MSW, LCSW Clincal Social Worker  [facility]

## 2020-07-02 NOTE — Progress Notes (Signed)
°   07/02/20 2000  Psychosocial Assessment  Patient Complaints Substance abuse  Eye Contact Fair  Facial Expression Anxious  Affect Anxious;Depressed  Speech Logical/coherent  Interaction Guarded  Motor Activity Slow  Appearance/Hygiene Unremarkable  Behavior Characteristics Appropriate to situation;Cooperative;Anxious  Mood Anxious;Pleasant  Thought Process  Coherency WDL  Content WDL  Delusions None reported or observed  Perception WDL  Hallucination None reported or observed  Judgment Poor  Confusion None  Danger to Self  Current suicidal ideation? Passive  Self-Injurious Behavior No self-injurious ideation or behavior indicators observed or expressed   Agreement Not to Harm Self Yes  Description of Agreement Pt states he will try to notify staff  Danger to Others  Danger to Others None reported or observed

## 2020-07-02 NOTE — Progress Notes (Signed)
   07/01/20 2000  Psych Admission Type (Psych Patients Only)  Admission Status Voluntary  Psychosocial Assessment  Patient Complaints Anxiety;Irritability  Eye Contact Fair  Facial Expression Anxious  Affect Anxious;Depressed  Speech Logical/coherent  Interaction Guarded;Defensive  Motor Activity Slow  Appearance/Hygiene Unremarkable  Behavior Characteristics Cooperative;Anxious;Irritable  Mood Depressed;Anxious  Thought Process  Coherency WDL  Content WDL  Delusions None reported or observed  Perception WDL  Hallucination None reported or observed  Judgment Poor  Confusion None  Danger to Self  Current suicidal ideation? Passive  Self-Injurious Behavior No self-injurious ideation or behavior indicators observed or expressed   Agreement Not to Harm Self Yes  Description of Agreement Pt states he will try to notify staff  Danger to Others  Danger to Others None reported or observed   Pt seems irritable tonight. Pt states that he is experiencing symptoms of withdrawal: chills, tremors, diarrhea and stomach pain.He endorses anxiety 9/10 d/t increased cravings for opiates. Pt endorses passive SI, stating, "I don't know if I'm gonna tell. If I wanted to do it why would I tell. Then you all can pick up the pieces." Pt informed that if he couldn't contract for safety, we would have to remove all items from his room that could hurt him in an effort to keep him safe.  Pt then stated that "I will try to tell you. I don't want to want to hurt myself." Pt because calmer as this Probation officer spoke with him. Came up with medication plan to help alleviate his symptoms so he could sleep. Pt denied HI, AVH.

## 2020-07-02 NOTE — Progress Notes (Signed)
[place] NOVEL CORONAVIRUS (COVID-19) DAILY CHECK-OFF SYMPTOMS - answer yes or no to each - every day NO YES  Have you had a fever in the past 24 hours?  . Fever (Temp > 37.80C / 100F) X   Have you had any of these symptoms in the past 24 hours? . New Cough .  Sore Throat  .  Shortness of Breath .  Difficulty Breathing .  Unexplained Body Aches   X   Have you had any one of these symptoms in the past 24 hours not related to allergies?   . Runny Nose .  Nasal Congestion .  Sneezing   X   If you have had runny nose, nasal congestion, sneezing in the past 24 hours, has it worsened?  X   EXPOSURES - check yes or no X   Have you traveled outside the state in the past 14 days?  X   Have you been in contact with someone with a confirmed diagnosis of COVID-19 or PUI in the past 14 days without wearing appropriate PPE?  X   Have you been living in the same home as a person with confirmed diagnosis of COVID-19 or a PUI (household contact)?    X   Have you been diagnosed with COVID-19?    X              What to do next: Answered NO to all: Answered YES to anything:   Proceed with unit schedule Follow the [facility] Inpatient Flowsheet.   

## 2020-07-02 NOTE — Progress Notes (Signed)
Pt c/o chills, tremors, muscle aches. "I feel like I'm going backwards into withdrawal again. I haven't been able to sleep through tonight and I was able to sleep through last night." Pt given Tylenol and Robaxin per [facility]. Pt previously didn't want Robaxin because it makes him groggy. Will continue to monitor.

## 2020-07-02 NOTE — Progress Notes (Signed)
   07/02/20 1400  Psychosocial Assessment  Eye Contact Fair  Facial Expression Anxious  Affect Anxious;Depressed  Speech Logical/coherent  Interaction Guarded;Defensive  Motor Activity Slow  Appearance/Hygiene Unremarkable  Thought Process  Coherency WDL  Content WDL  Delusions None reported or observed  Perception WDL  Hallucination None reported or observed  Judgment Poor  Confusion None  Danger to Self  Current suicidal ideation? Passive  Self-Injurious Behavior No self-injurious ideation or behavior indicators observed or expressed   Agreement Not to Harm Self Yes  Description of Agreement Pt states he will try to notify staff  Danger to Others  Danger to Others None reported or observed

## 2020-07-02 NOTE — Progress Notes (Signed)
Recreation Therapy Notes  Date:  1.21.22 Time: 0935 Location: [address] Hall Dayroom  Group Topic: Stress Management  Goal Area(s) Addresses:  Patient will identify positive stress management techniques. Patient will identify benefits of using stress management post d/c.  Intervention: Stress Management  Activity: Guided Imagery.  LRT read a script that took patients on a journey to anywhere they imagined by floating on a cloud.  Patients were to listen and follow along as script was read to fully engage in activity.    Education:  Stress Management, Discharge Planning.   Education Outcome: Acknowledges Education  Clinical Observations/Feedback:  Pt did not attend group session.    Victorino Sparrow, LRT/CTRS         Victorino Sparrow A 07/02/2020 11:59 AM

## 2020-07-02 NOTE — Progress Notes (Signed)
Adult Psychoeducational Group Note  Date:  07/02/2020 Time:  4:30 AM  Group Topic/Focus:  Wrap-Up Group:   The focus of this group is to help patients review their daily goal of treatment and discuss progress on daily workbooks.  Participation Level:  Active  Participation Quality:  Appropriate  Affect:  Appropriate  Cognitive:  Appropriate  Insight: Appropriate  Engagement in Group:  Engaged  Modes of Intervention:  Discussion  Additional Comments:  Pt attend wrap up group. His day was a 6. The one thing that happen to him his day was clear.  Lenice Llamas Long 07/02/2020, 4:30 AM

## 2020-07-02 NOTE — Progress Notes (Signed)
Pt c/o trouble sleeping. Contacted provider. Given verbal order for Trazodone 100 mg QHS PRN. Medications given as ordered.

## 2020-07-02 NOTE — Progress Notes (Signed)
Patient ID: Steve Henry, male   DOB: January 09, 1981, 40 y.o.   MRN: [id number]  [facility] MD Progress Note  07/02/2020 2:38 PM SAMYAK SACKMANN  MRN:  [id number] Subjective:  40 year old male presenting with Depression, si and polysubstance abuse. Principal Problem: Major depressive disorder, single episode, moderate (HCC) Diagnosis: Principal Problem:   Major depressive disorder, single episode, moderate (HCC) Active Problems:   Substance induced mood disorder ([place])  Total Time spent with patient: 30 minutes  Daily Note:  Patient reports that from a physical perspective he is feeling somewhat better although still experiencing some withdrawal symptomsm, especially at night. He was encouraged to ask nurse for PRN medications if the needs arise.  Patient states that he is still not sleeping well, agreeable to try melatonin QHS.  He states that his mood is still low and that he has thoughts of SI. He is agreeable to continue treatment.   Past Medical History:  Past Medical History:  Diagnosis Date   Chronic back pain    Chronic hand pain    DDD (degenerative disc disease), cervical    De Quervain's tenosynovitis    Drug-seeking behavior    Narcotic dependence ([place])     Past Surgical History:  Procedure Laterality Date   MASS EXCISION N/A 03/13/2018   Procedure: EXCISION LIPOMA SCALP;  Surgeon: Aviva Signs, MD;  Location: [facility];  Service: General;  Laterality: N/A;  pt knows to arrive at 6:15   SPINAL FUSION     Family History:  Family History  Family history unknown: Yes    Social History:  Social History   Substance and Sexual Activity  Alcohol Use No     Social History   Substance and Sexual Activity  Drug Use Yes   Comment: heroin and Fentanyl    Social History   Socioeconomic History   Marital status: Legally Separated    Spouse name: Not on file   Number of children: Not on file   Years of education: Not on file   Highest education level: Not  on file  Occupational History   Not on file  Tobacco Use   Smoking status: Current Every Day Smoker    Packs/day: 0.50    Years: 16.00    Pack years: 8.00    Types: Cigarettes   Smokeless tobacco: Never Used  Scientific laboratory technician Use: Never used  Substance and Sexual Activity   Alcohol use: No   Drug use: Yes    Comment: heroin and Fentanyl   Sexual activity: Yes    Birth control/protection: None  Other Topics Concern   Not on file  Social History Narrative   Not on file   Social Determinants of Health   Financial Resource Strain: Not on file  Food Insecurity: Not on file  Transportation Needs: Not on file  Physical Activity: Not on file  Stress: Not on file  Social Connections: Not on file   Additional Social History:                         Sleep: Fair  Appetite:  Fair  Current Medications: Current Facility-Administered Medications  Medication Dose Route Frequency Provider Last Rate Last Admin   acetaminophen (TYLENOL) tablet 650 mg  650 mg Oral Q6H PRN Sharma Covert, MD   650 mg at 07/02/20 0227   alum & mag hydroxide-simeth (MAALOX/MYLANTA) 200-200-20 MG/5ML suspension 30 mL  30 mL Oral Q4H PRN Clary,  Cordie Grice, MD       cloNIDine (CATAPRES) tablet 0.1 mg  0.1 mg Oral Q4H PRN Sharma Covert, MD       cloNIDine (CATAPRES) tablet 0.1 mg  0.1 mg Oral TID Dixie Dials, MD   0.1 mg at 07/02/20 1233   dicyclomine (BENTYL) tablet 20 mg  20 mg Oral Q6H PRN Sharma Covert, MD   20 mg at 07/01/20 1758   escitalopram (LEXAPRO) tablet 10 mg  10 mg Oral Daily Demaya Hardge, Dorene Ar, MD       hydrOXYzine (ATARAX/VISTARIL) tablet 25 mg  25 mg Oral Q6H PRN Sharma Covert, MD   25 mg at 07/02/20 0037   loperamide (IMODIUM) capsule 2-4 mg  2-4 mg Oral PRN Sharma Covert, MD   4 mg at 07/01/20 2120   magnesium hydroxide (MILK OF MAGNESIA) suspension 30 mL  30 mL Oral Daily PRN Sharma Covert, MD       melatonin tablet 3 mg   3 mg Oral QHS Derric Dealmeida, Dorene Ar, MD       methocarbamol (ROBAXIN) tablet 500 mg  500 mg Oral Q8H PRN Sharma Covert, MD   500 mg at 07/02/20 1607   naproxen (NAPROSYN) tablet 500 mg  500 mg Oral BID PRN Sharma Covert, MD   500 mg at 07/02/20 1235   nicotine (NICODERM CQ - dosed in mg/24 hours) patch 21 mg  21 mg Transdermal Daily Sharma Covert, MD   21 mg at 07/02/20 1236   ondansetron (ZOFRAN-ODT) disintegrating tablet 4 mg  4 mg Oral Q6H PRN Sharma Covert, MD       traZODone (DESYREL) tablet 100 mg  100 mg Oral QHS PRN Lindon Romp A, NP   100 mg at 07/02/20 0037    Lab Results: No results found for this or any previous visit (from the past 30 hour(s)).  Blood Alcohol level:  Lab Results  Component Value Date   ETH <10 06/28/2020   ETH <10 37/03/6268    Metabolic Disorder Labs: No results found for: HGBA1C, MPG No results found for: PROLACTIN No results found for: CHOL, TRIG, HDL, CHOLHDL, VLDL, LDLCALC  Physical Findings: AIMS: Facial and Oral Movements Muscles of Facial Expression: None, normal Lips and Perioral Area: None, normal Jaw: None, normal Tongue: None, normal,Extremity Movements Upper (arms, wrists, hands, fingers): None, normal Lower (legs, knees, ankles, toes): None, normal, Trunk Movements Neck, shoulders, hips: None, normal, Overall Severity Severity of abnormal movements (highest score from questions above): None, normal Incapacitation due to abnormal movements: None, normal Patient's awareness of abnormal movements (rate only patient's report): No Awareness, Dental Status Current problems with teeth and/or dentures?: No Does patient usually wear dentures?: No  CIWA:    COWS:  COWS Total Score: 0  Musculoskeletal: Strength & Muscle Tone: within normal limits Gait & Station: normal Patient leans: N/A  Psychiatric Specialty Exam: Physical Exam HENT:     Head: Normocephalic.     Mouth/Throat:     Mouth: Mucous membranes are  moist.  Eyes:     Pupils: Pupils are equal, round, and reactive to light.  Musculoskeletal:        General: Normal range of motion.  Skin:    General: Skin is warm.  Neurological:     General: No focal deficit present.     Mental Status: He is alert.     Review of Systems  Constitutional: Positive for chills and fatigue.  Eyes: Negative  for discharge.  Cardiovascular: Negative for chest pain.  Gastrointestinal: Negative for abdominal distention.  Genitourinary: Negative for difficulty urinating.  Psychiatric/Behavioral: Negative for agitation.    Blood pressure 129/75, pulse 66, temperature 98.6 F (37 C), temperature source Oral, resp. rate 18, height 5\' 3"  (1.6 m), weight 61.5 kg, SpO2 100 %.Body mass index is 24 kg/m.  General Appearance: Casual  Eye Contact:  Good  Speech:  Slow  Volume:  Decreased  Mood:  Depressed and Dysphoric  Affect:  Congruent  Thought Process:  Coherent  Orientation:  Full (Time, Place, and Person)  Thought Content:  Logical  Suicidal Thoughts:  Yes.  without intent/plan  Homicidal Thoughts:  No  Memory:  Recent;   Fair  Judgement:  Fair  Insight:  Fair  Psychomotor Activity:  Normal  Concentration:  Concentration: Fair  Recall:  [employer] of Knowledge:  Fair  Language:  Fair  Akathisia:  No  Handed:  Right  AIMS (if indicated):     Assets:  Desire for Improvement Resilience  ADL's:  Intact  Cognition:  WNL  Sleep:  Number of Hours: 3     Treatment Plan Summary: Daily contact with patient to assess and evaluate symptoms and progress in treatment and Medication management   40 year old male with history of polysubstance abuse, presenting with depressed mood. So far patient has been in good behavioral control and shows desire to come off of drugs and obtain psychiatric treatment. His SI is still persisting at this time. Diagnosis remains MDD without psychotic features.    Continue with Lexapro 10 mg po qam Add melatonin for sleep  3mg  po qhs Continue with Withdrawal protocol,  With modification, Clonidine 0.1 mg TID as patient is reporting strong withdrawal symptoms at night.  PRN Mylanta, Imodium, Bentyl, Robaxin -Dispo planning: rehab   Dixie Dials, MD 07/02/2020, 2:38 PM

## 2020-07-02 NOTE — Progress Notes (Signed)
°   07/02/20 2042  COVID-19 Daily Checkoff  Have you had a fever (temp > 37.80C/100F)  in the past 24 hours?  No  If you have had runny nose, nasal congestion, sneezing in the past 24 hours, has it worsened? No  COVID-19 EXPOSURE  Have you traveled outside the state in the past 14 days? No  Have you been in contact with someone with a confirmed diagnosis of COVID-19 or PUI in the past 14 days without wearing appropriate PPE? No  Have you been living in the same home as a person with confirmed diagnosis of COVID-19 or a PUI (household contact)? No  Have you been diagnosed with COVID-19? No

## 2020-07-02 NOTE — [facility] Suicide Risk Assessment (Signed)
[place] INPATIENT:  Family/Significant Other Suicide Prevention Education  Refusal of Consents:  Suicide Prevention Education:  Patient Refusal for Family/Significant Other Suicide Prevention Education: The patient Steve Henry has refused to provide written consent for family/significant other to be provided Family/Significant Other Suicide Prevention Education during admission and/or prior to discharge.  Physician notified.   SPE completed with patient, as patient refused to consent to family contact. SPI pamphlet provided to pt and pt was encouraged to share information with support network, ask questions, and talk about any concerns relating to SPE. Patient denies access to guns/firearms and verbalized understanding of information provided. Mobile Crisis information also provided to patient.   Darletta Moll MSW, LCSW Clincal Social Worker  [facility]

## 2020-07-03 DIAGNOSIS — F1410 Cocaine abuse, uncomplicated: Secondary | ICD-10-CM | POA: Diagnosis present

## 2020-07-03 DIAGNOSIS — F332 Major depressive disorder, recurrent severe without psychotic features: Secondary | ICD-10-CM | POA: Diagnosis present

## 2020-07-03 DIAGNOSIS — F1123 Opioid dependence with withdrawal: Principal | ICD-10-CM

## 2020-07-03 DIAGNOSIS — F1120 Opioid dependence, uncomplicated: Secondary | ICD-10-CM | POA: Diagnosis present

## 2020-07-03 LAB — SARS CORONAVIRUS 2 (TAT 6-24 HRS): SARS Coronavirus 2: NEGATIVE

## 2020-07-03 MED ORDER — GABAPENTIN 100 MG PO CAPS
100.0000 mg | ORAL_CAPSULE | Freq: Three times a day (TID) | ORAL | Status: DC
  Administered 2020-07-03 – 2020-07-05 (×6): 100 mg via ORAL
  Filled 2020-07-03 (×10): qty 1

## 2020-07-03 MED ORDER — NALTREXONE HCL 50 MG PO TABS
25.0000 mg | ORAL_TABLET | Freq: Every day | ORAL | Status: DC
  Administered 2020-07-04: 25 mg via ORAL
  Filled 2020-07-03 (×4): qty 1

## 2020-07-03 MED ORDER — QUETIAPINE FUMARATE 25 MG PO TABS
25.0000 mg | ORAL_TABLET | Freq: Every evening | ORAL | Status: DC | PRN
  Administered 2020-07-03 – 2020-07-04 (×3): 25 mg via ORAL
  Filled 2020-07-03 (×2): qty 1

## 2020-07-03 MED ORDER — CLONIDINE HCL 0.1 MG PO TABS
0.1000 mg | ORAL_TABLET | Freq: Two times a day (BID) | ORAL | Status: DC
  Administered 2020-07-04: 0.1 mg via ORAL
  Filled 2020-07-03 (×6): qty 1

## 2020-07-03 NOTE — Progress Notes (Signed)
   07/03/20 1600  Psych Admission Type (Psych Patients Only)  Admission Status Voluntary  Psychosocial Assessment  Patient Complaints Anxiety;Depression  Eye Contact Fair  Facial Expression Anxious  Affect Anxious;Depressed  Speech Logical/coherent  Interaction Guarded  Motor Activity Slow  Appearance/Hygiene Unremarkable  Behavior Characteristics Cooperative;Appropriate to situation  Mood Anxious;Pleasant  Thought Process  Coherency WDL  Content WDL  Delusions None reported or observed  Perception WDL  Hallucination None reported or observed  Judgment Poor  Confusion None  Danger to Self  Current suicidal ideation? Denies  Self-Injurious Behavior No self-injurious ideation or behavior indicators observed or expressed   Agreement Not to Harm Self Yes  Description of Agreement Pt states he will try to notify staff  Danger to Others  Danger to Others None reported or observed

## 2020-07-03 NOTE — Progress Notes (Signed)
[facility] NOVEL CORONAVIRUS (COVID-19) DAILY CHECK-OFF SYMPTOMS - answer yes or no to each - every day NO YES  Have you had a fever in the past 24 hours?  . Fever (Temp > 37.80C / 100F) X   Have you had any of these symptoms in the past 24 hours? . New Cough .  Sore Throat  .  Shortness of Breath .  Difficulty Breathing .  Unexplained Body Aches   X   Have you had any one of these symptoms in the past 24 hours not related to allergies?   . Runny Nose .  Nasal Congestion .  Sneezing   X   If you have had runny nose, nasal congestion, sneezing in the past 24 hours, has it worsened?  X   EXPOSURES - check yes or no X   Have you traveled outside the state in the past 14 days?  X   Have you been in contact with someone with a confirmed diagnosis of COVID-19 or PUI in the past 14 days without wearing appropriate PPE?  X   Have you been living in the same home as a person with confirmed diagnosis of COVID-19 or a PUI (household contact)?    X   Have you been diagnosed with COVID-19?    X              What to do next: Answered NO to all: Answered YES to anything:   Proceed with unit schedule Follow the [facility] Inpatient Flowsheet.   

## 2020-07-03 NOTE — Progress Notes (Signed)
Patient ID: Steve Henry, male   DOB: 02-08-1981, 40 y.o.   MRN: [id number]  [facility] MD Progress Note  07/03/2020 2:44 PM Steve Henry  MRN:  [id number]   Subjective:  40 year old male presenting with Depression, SI, and polysubstance abuse.  He reports feeling "so-so".  8/10 depression with passive intermittent suicidal ideations; none on assessment.  His withdrawal symptoms are making his mood worse and lack of sleep. Complains of cravings, anxiety, and tremors.  Does not like "feeling out of it" like he does at this time.  "I don't want to be a zombie".  7/10- anxiety, no panic attacks.  Discussed medication options and decided to discontinue the Trazodone, hydroxyzine, and melatonin to help prevent some of his side effects.  Starting gabapentin 100 mg TID and Seroquel 25 mg at bedtime PRN as he complains of his "mind racing" when he is trying to sleep as he is perseverating over the past.  Principal Problem: Major depressive disorder, recurrent severe without psychotic features ([place]) Diagnosis: Principal Problem:   Major depressive disorder, recurrent severe without psychotic features ([place]) Active Problems:   Opiate dependence ([place])   Cocaine abuse ([place])   Substance induced mood disorder ([place])  Total Time spent with patient: 30 minutes  Past Medical History:  Past Medical History:  Diagnosis Date  . Chronic back pain   . Chronic hand pain   . DDD (degenerative disc disease), cervical   . De Quervain's tenosynovitis   . Drug-seeking behavior   . Narcotic dependence ([place])     Past Surgical History:  Procedure Laterality Date  . MASS EXCISION N/A 03/13/2018   Procedure: EXCISION LIPOMA SCALP;  Surgeon: Aviva Signs, MD;  Location: [facility];  Service: General;  Laterality: N/A;  pt knows to arrive at 6:15  . SPINAL FUSION     Family History:  Family History  Family history unknown: Yes    Social History:  Social History   Substance and Sexual Activity  Alcohol Use No      Social History   Substance and Sexual Activity  Drug Use Yes   Comment: heroin and Fentanyl    Social History   Socioeconomic History  . Marital status: Legally Separated    Spouse name: Not on file  . Number of children: Not on file  . Years of education: Not on file  . Highest education level: Not on file  Occupational History  . Not on file  Tobacco Use  . Smoking status: Current Every Day Smoker    Packs/day: 0.50    Years: 16.00    Pack years: 8.00    Types: Cigarettes  . Smokeless tobacco: Never Used  Vaping Use  . Vaping Use: Never used  Substance and Sexual Activity  . Alcohol use: No  . Drug use: Yes    Comment: heroin and Fentanyl  . Sexual activity: Yes    Birth control/protection: None  Other Topics Concern  . Not on file  Social History Narrative  . Not on file   Social Determinants of Health   Financial Resource Strain: Not on file  Food Insecurity: Not on file  Transportation Needs: Not on file  Physical Activity: Not on file  Stress: Not on file  Social Connections: Not on file   Additional Social History:                         Sleep: Fair  Appetite:  Fair  Current Medications: Current Facility-Administered Medications  Medication Dose Route Frequency Provider Last Rate Last Admin  . acetaminophen (TYLENOL) tablet 650 mg  650 mg Oral Q6H PRN Sharma Covert, MD   650 mg at 07/02/20 2035  . alum & mag hydroxide-simeth (MAALOX/MYLANTA) 200-200-20 MG/5ML suspension 30 mL  30 mL Oral Q4H PRN Sharma Covert, MD      . cloNIDine (CATAPRES) tablet 0.1 mg  0.1 mg Oral Q4H PRN Sharma Covert, MD      . cloNIDine (CATAPRES) tablet 0.1 mg  0.1 mg Oral TID Dixie Dials, MD   0.1 mg at 07/03/20 1145  . dicyclomine (BENTYL) tablet 20 mg  20 mg Oral Q6H PRN Sharma Covert, MD   20 mg at 07/01/20 1758  . escitalopram (LEXAPRO) tablet 10 mg  10 mg Oral Daily Cristofano, Dorene Ar, MD   10 mg at 07/03/20 0801  . hydrOXYzine  (ATARAX/VISTARIL) tablet 25 mg  25 mg Oral Q6H PRN Sharma Covert, MD   25 mg at 07/02/20 2036  . loperamide (IMODIUM) capsule 2-4 mg  2-4 mg Oral PRN Sharma Covert, MD   4 mg at 07/02/20 2036  . magnesium hydroxide (MILK OF MAGNESIA) suspension 30 mL  30 mL Oral Daily PRN Sharma Covert, MD      . melatonin tablet 3 mg  3 mg Oral QHS Cristofano, Dorene Ar, MD   3 mg at 07/02/20 2036  . methocarbamol (ROBAXIN) tablet 500 mg  500 mg Oral Q8H PRN Sharma Covert, MD   500 mg at 07/02/20 1610  . naproxen (NAPROSYN) tablet 500 mg  500 mg Oral BID PRN Sharma Covert, MD   500 mg at 07/03/20 0100  . nicotine (NICODERM CQ - dosed in mg/24 hours) patch 21 mg  21 mg Transdermal Daily Sharma Covert, MD   21 mg at 07/03/20 0800  . ondansetron (ZOFRAN-ODT) disintegrating tablet 4 mg  4 mg Oral Q6H PRN Sharma Covert, MD      . traZODone (DESYREL) tablet 100 mg  100 mg Oral QHS PRN Lindon Romp A, NP   100 mg at 07/02/20 2328    Lab Results: No results found for this or any previous visit (from the past 48 hour(s)).  Blood Alcohol level:  Lab Results  Component Value Date   ETH <10 06/28/2020   ETH <10 96/09/5407    Metabolic Disorder Labs: No results found for: HGBA1C, MPG No results found for: PROLACTIN No results found for: CHOL, TRIG, HDL, CHOLHDL, VLDL, LDLCALC  Physical Findings: AIMS: Facial and Oral Movements Muscles of Facial Expression: None, normal Lips and Perioral Area: None, normal Jaw: None, normal Tongue: None, normal,Extremity Movements Upper (arms, wrists, hands, fingers): None, normal Lower (legs, knees, ankles, toes): None, normal, Trunk Movements Neck, shoulders, hips: None, normal, Overall Severity Severity of abnormal movements (highest score from questions above): None, normal Incapacitation due to abnormal movements: None, normal Patient's awareness of abnormal movements (rate only patient's report): No Awareness, Dental Status Current  problems with teeth and/or dentures?: No Does patient usually wear dentures?: No  CIWA:    COWS:  COWS Total Score: 2  Musculoskeletal: Strength & Muscle Tone: within normal limits Gait & Station: normal Patient leans: N/A  Psychiatric Specialty Exam: Physical Exam HENT:     Head: Normocephalic.     Mouth/Throat:     Mouth: Mucous membranes are moist.  Eyes:     Pupils: Pupils are equal, round,  and reactive to light.  Pulmonary:     Effort: Pulmonary effort is normal.  Musculoskeletal:        General: Normal range of motion.     Cervical back: Normal range of motion.  Skin:    General: Skin is warm.  Neurological:     General: No focal deficit present.     Mental Status: He is alert.  Psychiatric:        Attention and Perception: Attention and perception normal.        Mood and Affect: Mood is anxious and depressed.        Speech: Speech normal.        Behavior: Behavior normal. Behavior is cooperative.        Thought Content: Thought content includes suicidal ideation.        Cognition and Memory: Cognition and memory normal.        Judgment: Judgment normal.     Review of Systems  Constitutional: Positive for fatigue.  HENT: Negative.   Eyes: Negative.  Negative for discharge.  Respiratory: Negative.   Cardiovascular: Negative.  Negative for chest pain.  Gastrointestinal: Negative.  Negative for abdominal distention.  Endocrine: Negative.   Genitourinary: Negative.  Negative for difficulty urinating.  Musculoskeletal: Negative.   Skin: Negative.   Allergic/Immunologic: Negative.   Neurological: Positive for tremors.  Hematological: Negative.   Psychiatric/Behavioral: Positive for dysphoric mood and suicidal ideas. Negative for agitation. The patient is nervous/anxious.     Blood pressure 135/90, pulse (!) 102, temperature 98.7 F (37.1 C), temperature source Oral, resp. rate 18, height 5\' 3"  (1.6 m), weight 61.5 kg, SpO2 100 %.Body mass index is 24 kg/m.   General Appearance: Casual  Eye Contact:  Good  Speech:  Normal  Volume:  Decreased  Mood:  Depressed and Dysphoric, anxious  Affect:  Congruent  Thought Process:  Coherent  Orientation:  Full (Time, Place, and Person)  Thought Content:  Logical  Suicidal Thoughts:  Yes.  without intent/plan, intermittent  Homicidal Thoughts:  No  Memory:  Recent;   Fair  Judgement:  Fair  Insight:  Fair  Psychomotor Activity:  Restless  Concentration:  Concentration: Fair  Recall:  [employer] of Knowledge:  Fair  Language:  Fair  Akathisia:  No  Handed:  Right  AIMS (if indicated):     Assets:  Desire for Improvement Resilience  ADL's:  Intact  Cognition:  WNL  Sleep:  Number of Hours: 3     Treatment Plan Summary: Daily contact with patient to assess and evaluate symptoms and progress in treatment and Medication management   40 year old male with history of polysubstance abuse, presenting with depressed mood. So far patient has been in good behavioral control and shows desire to come off of drugs and obtain psychiatric treatment. His SI is still persisting at this time. Diagnosis remains MDD without psychotic features.   Major depressive disorder, recurrent, severe without psychosis: Continue with Lexapro 10 mg po qam  Insomnia: Discontinue melatonin for sleep 3mg  po qhs Discontinue Trazodone 100 mg at bedtime PRN Start Seroquel 25 mg at bedtime PRN  Opiate withdrawal: -Reduced clonidine 0.1 mg TID to BID -PRN medications in place with the clonidine opiate withdrawal protocol  Cravings: -Started naltrexone 25 mg daily will titrate up to 50 mg  Anxiety: -Discontinued hydroxyzine 25 mg TID PRN -Started gabapenin 100 mg TID  Waylan Boga, NP 07/03/2020, 2:44 PM

## 2020-07-04 MED ORDER — DICYCLOMINE HCL 20 MG PO TABS
20.0000 mg | ORAL_TABLET | Freq: Once | ORAL | Status: AC
  Administered 2020-07-04: 20 mg via ORAL

## 2020-07-04 MED ORDER — CLONIDINE HCL 0.1 MG PO TABS
0.1000 mg | ORAL_TABLET | Freq: Every day | ORAL | Status: DC
  Administered 2020-07-05 – 2020-07-11 (×7): 0.1 mg via ORAL
  Filled 2020-07-04 (×10): qty 1

## 2020-07-04 NOTE — Progress Notes (Signed)
Patient ID: Steve Henry, male   DOB: 11-Jun-1981, 40 y.o.   MRN: [id number]  [facility] MD Progress Note  07/04/2020 8:42 AM Steve Henry  MRN:  [id number]   Patient brief:  Admitted for suicidal ideations r/t his fentanyl and stimulant dependence starting with his neck fusion in 2017.      Subjective:  40 year old male presenting with Depression, SI, and polysubstance abuse. During interview this morning, patient reported that his "mood was great" when he first awakened. Patient stated, "I felt awesome when I woke up. I felt like myself, I had energy." Patient reported that he began having sharp stomach pains (mid-gastric region) after taking Naltrexone this morning. On assessment, patient did not appear to be in acute distress, but was guarding his abdomen, which is consistent with c/o stomach pain. Patient denied N/V/D and other symptoms of withdrawal. Appetite is excellent, ate 100% of breakfast this morning. On evaluation yesterday, Patient endorsed 8/10 depression (with 0 being no depression and 10 being severe depression) with passive intermittent suicidal ideations. On assessment today, patient denied suicidal ideation, plan or intent with 6/10 depression. Patient was relatively positive about medication changes made yesterday and believes Seroquel was beneficial in helping him sleep last night. Patient endorsed 6/10 anxiety today (with 0 being no anxiety and 10 being severe anxiety). Patient denied homicidal ideation or thoughts of hurting others.  This afternoon, provider followed up with patient regarding stomach pain. Patient reported that pain had subsided and he felt much better. However, patient sill requested that Naltrexone be discontinued, as he was no longer interested in taking it. Patient then began discussing how he began using "pain pills" after his neck surgery in 2017. Patient stated that he had never used any type of drugs (no alcohol, marijuana, etc.) before he had this surgery.  After using opioid pain medications for one year post-op, patient reported that his MD stopped giving him refills. As a result, patient experienced severe withdrawal "flu-like" symptoms (N/V/D, chills, etc.). He was given opioid medications by his cousin, and "felt better right away". Patient expressed anger about becoming addicted to pain medication, and "blames them" for his current situation. Writer provided therapeutic support and discussed physical dependence, as well as positive aspects of patient's life. Patient stated that he is interested in going to rehab at [facility]. After rehab, patient would like to live in sober living or a half-way house. Patient has two children (a 29-year-old daughter and a 66-year-old son) and is motivated to get sober for them. Patient currently resides with his mother, and reported that she is source of support for him.        Principal Problem: Major depressive disorder, recurrent severe without psychotic features ([place]) Diagnosis: Principal Problem:   Major depressive disorder, recurrent severe without psychotic features ([place]) Active Problems:   Opiate dependence ([place])   Cocaine abuse ([place])   Substance induced mood disorder ([place])  Total Time spent with patient: 45 minutes  Past Medical History:  Past Medical History:  Diagnosis Date  . Chronic back pain   . Chronic hand pain   . DDD (degenerative disc disease), cervical   . De Quervain's tenosynovitis   . Drug-seeking behavior   . Narcotic dependence ([place])     Past Surgical History:  Procedure Laterality Date  . MASS EXCISION N/A 03/13/2018   Procedure: EXCISION LIPOMA SCALP;  Surgeon: Aviva Signs, MD;  Location: [facility];  Service: General;  Laterality: N/A;  pt knows to arrive at  6:61  . SPINAL FUSION     Family History:  Family History  Family history unknown: Yes    Social History:  Social History   Substance and Sexual Activity  Alcohol Use No     Social History   Substance and Sexual  Activity  Drug Use Yes   Comment: heroin and Fentanyl    Social History   Socioeconomic History  . Marital status: Legally Separated    Spouse name: Not on file  . Number of children: Not on file  . Years of education: Not on file  . Highest education level: Not on file  Occupational History  . Not on file  Tobacco Use  . Smoking status: Current Every Day Smoker    Packs/day: 0.50    Years: 16.00    Pack years: 8.00    Types: Cigarettes  . Smokeless tobacco: Never Used  Vaping Use  . Vaping Use: Never used  Substance and Sexual Activity  . Alcohol use: No  . Drug use: Yes    Comment: heroin and Fentanyl  . Sexual activity: Yes    Birth control/protection: None  Other Topics Concern  . Not on file  Social History Narrative  . Not on file   Social Determinants of Health   Financial Resource Strain: Not on file  Food Insecurity: Not on file  Transportation Needs: Not on file  Physical Activity: Not on file  Stress: Not on file  Social Connections: Not on file   Additional Social History:   Sleep: Good  Appetite:  Good  Current Medications: Current Facility-Administered Medications  Medication Dose Route Frequency Provider Last Rate Last Admin  . acetaminophen (TYLENOL) tablet 650 mg  650 mg Oral Q6H PRN Sharma Covert, MD   650 mg at 07/02/20 2035  . alum & mag hydroxide-simeth (MAALOX/MYLANTA) 200-200-20 MG/5ML suspension 30 mL  30 mL Oral Q4H PRN Sharma Covert, MD      . cloNIDine (CATAPRES) tablet 0.1 mg  0.1 mg Oral Q4H PRN Sharma Covert, MD   0.1 mg at 07/03/20 1901  . cloNIDine (CATAPRES) tablet 0.1 mg  0.1 mg Oral BID Patrecia Pour, NP   0.1 mg at 07/04/20 0741  . dicyclomine (BENTYL) tablet 20 mg  20 mg Oral Q6H PRN Sharma Covert, MD   20 mg at 07/01/20 1758  . escitalopram (LEXAPRO) tablet 10 mg  10 mg Oral Daily Cristofano, Dorene Ar, MD   10 mg at 07/04/20 0739  . gabapentin (NEURONTIN) capsule 100 mg  100 mg Oral TID Patrecia Pour, NP   100 mg at 07/03/20 2226  . loperamide (IMODIUM) capsule 2-4 mg  2-4 mg Oral PRN Sharma Covert, MD   4 mg at 07/02/20 2036  . magnesium hydroxide (MILK OF MAGNESIA) suspension 30 mL  30 mL Oral Daily PRN Sharma Covert, MD      . methocarbamol (ROBAXIN) tablet 500 mg  500 mg Oral Q8H PRN Sharma Covert, MD   500 mg at 07/02/20 6387  . naltrexone (DEPADE) tablet 25 mg  25 mg Oral Daily Patrecia Pour, NP   25 mg at 07/04/20 0741  . naproxen (NAPROSYN) tablet 500 mg  500 mg Oral BID PRN Sharma Covert, MD   500 mg at 07/03/20 0100  . nicotine (NICODERM CQ - dosed in mg/24 hours) patch 21 mg  21 mg Transdermal Daily Sharma Covert, MD   21 mg at 07/04/20 0732  .  ondansetron (ZOFRAN-ODT) disintegrating tablet 4 mg  4 mg Oral Q6H PRN Sharma Covert, MD      . QUEtiapine (SEROQUEL) tablet 25 mg  25 mg Oral QHS PRN Patrecia Pour, NP   25 mg at 07/03/20 2226    Lab Results:  Results for orders placed or performed during the hospital encounter of 06/29/20 (from the past 48 hour(s))  SARS CORONAVIRUS 2 (TAT 6-24 HRS) Nasopharyngeal Nasopharyngeal Swab     Status: None   Collection Time: 07/03/20  9:20 AM   Specimen: Nasopharyngeal Swab  Result Value Ref Range   SARS Coronavirus 2 NEGATIVE NEGATIVE    Comment: (NOTE) SARS-CoV-2 target nucleic acids are NOT DETECTED.  The SARS-CoV-2 RNA is generally detectable in upper and lower respiratory specimens during the acute phase of infection. Negative results do not preclude SARS-CoV-2 infection, do not rule out co-infections with other pathogens, and should not be used as the sole basis for treatment or other patient management decisions. Negative results must be combined with clinical observations, patient history, and epidemiological information. The expected result is Negative.  Fact Sheet for Patients: SugarRoll.be  Fact Sheet for Healthcare  Providers: https://www.woods-mathews.com/  This test is not yet approved or cleared by the [place] FDA and  has been authorized for detection and/or diagnosis of SARS-CoV-2 by FDA under an Emergency Use Authorization (EUA). This EUA will remain  in effect (meaning this test can be used) for the duration of the COVID-19 declaration under Se ction 564(b)(1) of the Act, 21 U.S.C. section 360bbb-3(b)(1), unless the authorization is terminated or revoked sooner.  Performed at [facility], [place] [address] [place], [place] [address]     Blood Alcohol level:  Lab Results  Component Value Date   ETH <10 06/28/2020   ETH <10 93/23/5573    Metabolic Disorder Labs: No results found for: HGBA1C, MPG No results found for: PROLACTIN No results found for: CHOL, TRIG, HDL, CHOLHDL, VLDL, LDLCALC  Physical Findings: AIMS: Facial and Oral Movements Muscles of Facial Expression: None, normal Lips and Perioral Area: None, normal Jaw: None, normal Tongue: None, normal,Extremity Movements Upper (arms, wrists, hands, fingers): None, normal Lower (legs, knees, ankles, toes): None, normal, Trunk Movements Neck, shoulders, hips: None, normal, Overall Severity Severity of abnormal movements (highest score from questions above): None, normal Incapacitation due to abnormal movements: None, normal Patient's awareness of abnormal movements (rate only patient's report): No Awareness, Dental Status Current problems with teeth and/or dentures?: No Does patient usually wear dentures?: No  CIWA:    COWS:  COWS Total Score: 4  Musculoskeletal: Strength & Muscle Tone: within normal limits Gait & Station: normal Patient leans: N/A  Psychiatric Specialty Exam: Physical Exam Vitals and nursing note reviewed.  HENT:     Head: Normocephalic.     Mouth/Throat:     Mouth: Mucous membranes are moist.  Eyes:     Pupils: Pupils are equal, round, and reactive to light.  Pulmonary:      Effort: Pulmonary effort is normal.  Abdominal:     Tenderness: There is guarding.  Musculoskeletal:        General: Normal range of motion.     Cervical back: Normal range of motion.  Skin:    General: Skin is warm.  Neurological:     General: No focal deficit present.     Mental Status: He is alert.  Psychiatric:        Attention and Perception: Attention and perception normal.  Mood and Affect: Mood is anxious and depressed.        Speech: Speech normal.        Behavior: Behavior normal. Behavior is cooperative.        Thought Content: Thought content normal.        Cognition and Memory: Cognition and memory normal.        Judgment: Judgment normal.     Review of Systems  Constitutional: Positive for fatigue.  HENT: Negative.   Eyes: Negative.  Negative for discharge.  Respiratory: Negative.   Cardiovascular: Negative.  Negative for chest pain.  Gastrointestinal: Negative.  Negative for abdominal distention.  Endocrine: Negative.   Genitourinary: Negative.  Negative for difficulty urinating.  Musculoskeletal: Negative.   Skin: Negative.   Allergic/Immunologic: Negative.   Neurological: Positive for tremors.  Hematological: Negative.   Psychiatric/Behavioral: Positive for dysphoric mood and suicidal ideas. Negative for agitation. The patient is nervous/anxious.     Blood pressure (!) 125/95, pulse 84, temperature 98.3 F (36.8 C), temperature source Oral, resp. rate 18, height 5\' 3"  (1.6 m), weight 61.5 kg, SpO2 100 %.Body mass index is 24 kg/m.  General Appearance: Casual  Eye Contact:  Good  Speech:  Normal  Volume:  Decreased  Mood:  Depressed and Dysphoric  Affect:  Congruent  Thought Process:  Coherent  Orientation:  Full (Time, Place, and Person)  Thought Content:  Logical  Suicidal Thoughts:  No, denies at present  Homicidal Thoughts:  No  Memory:  Recent;   Fair  Judgement:  Fair  Insight:  Fair  Psychomotor Activity:  Restless  Concentration:   Concentration: Fair  Recall:  [employer] of Knowledge:  Fair  Language:  Fair  Akathisia:  No  Handed:  Right  AIMS (if indicated):     Assets:  Desire for Improvement Resilience  ADL's:  Intact  Cognition:  WNL  Sleep:  Number of Hours: 5.5     Treatment Plan Summary: Daily contact with patient to assess and evaluate symptoms and progress in treatment and Medication management   40 year old male with a history of polysubstance abuse, presenting with depressed mood. During this hospitalization, patient has been in good behavioral control and continues to verbalize a desire to stop using drugs and obtaining psychiatric treatment. Diagnosis remains MDD without psychotic features.   Major depressive disorder, recurrent, severe without psychosis: Continue with Lexapro 10 mg po qam  Insomnia: Continue Seroquel 25 mg at bedtime PRN  Opiate withdrawal: -Reduced clonidine 0.1 mg BID to daily starting tomorrow -PRN medications in place with the clonidine opiate withdrawal protocol  Cravings: -Discontinue naltrexone 25 mg daily r/t side effects of abdominal pain, patient request  Anxiety: -Continue Gabapentin 100 mg TID  Waylan Boga, NP 07/04/2020, 8:42 AM

## 2020-07-04 NOTE — Progress Notes (Signed)
   07/03/20 2230  COVID-19 Daily Checkoff  Have you had a fever (temp > 37.80C/100F)  in the past 24 hours?  No  If you have had runny nose, nasal congestion, sneezing in the past 24 hours, has it worsened? No  COVID-19 EXPOSURE  Have you traveled outside the state in the past 14 days? No  Have you been in contact with someone with a confirmed diagnosis of COVID-19 or PUI in the past 14 days without wearing appropriate PPE? No  Have you been living in the same home as a person with confirmed diagnosis of COVID-19 or a PUI (household contact)? No  Have you been diagnosed with COVID-19? No

## 2020-07-04 NOTE — Progress Notes (Signed)
   07/03/20 2230  Psychosocial Assessment  Patient Complaints Insomnia  Eye Contact Fair  Facial Expression Anxious  Affect Anxious;Depressed  Speech Logical/coherent  Interaction Guarded  Motor Activity Slow  Appearance/Hygiene Unremarkable  Behavior Characteristics Anxious  Mood Anxious;Preoccupied  Thought Process  Coherency WDL  Content WDL  Delusions None reported or observed  Perception WDL  Hallucination None reported or observed  Judgment Poor  Confusion None  Danger to Self  Current suicidal ideation? Denies  Self-Injurious Behavior No self-injurious ideation or behavior indicators observed or expressed   Agreement Not to Harm Self Yes  Description of Agreement Pt states he will try to notify staff  Danger to Others  Danger to Others None reported or observed

## 2020-07-04 NOTE — [facility] Group Notes (Signed)
Adult Psychoeducational Group Not Date:  07/04/2020 Time:  0350-0938 Group Topic/Focus: PROGRESSIVE RELAXATION. A group where deep breathing is taught and tensing and relaxation muscle groups is used. Imagery is used as well.  Pts are asked to imagine 3 pillars that hold them up when they are not able to hold themselves up.  Participation Level:  Did not attend   Paulino Rily 07/04/2020

## 2020-07-04 NOTE — Progress Notes (Addendum)
   07/04/20 1000  Psych Admission Type (Psych Patients Only)  Admission Status Voluntary  Psychosocial Assessment  Patient Complaints Irritability  Eye Contact Fair  Facial Expression Anxious  Affect Anxious;Depressed  Speech Logical/coherent  Interaction Guarded  Motor Activity Slow  Appearance/Hygiene Unremarkable  Behavior Characteristics Cooperative;Anxious  Mood Anxious;Preoccupied  Thought Process  Coherency WDL  Content WDL  Delusions None reported or observed  Perception WDL  Hallucination None reported or observed  Judgment Poor  Confusion None  Danger to Self  Current suicidal ideation? Denies  Self-Injurious Behavior No self-injurious ideation or behavior indicators observed or expressed   Agreement Not to Harm Self Yes  Description of Agreement Pt states he will try to notify staff  Danger to Others  Danger to Others None reported or observed

## 2020-07-04 NOTE — Progress Notes (Signed)
[place] NOVEL CORONAVIRUS (COVID-19) DAILY CHECK-OFF SYMPTOMS - answer yes or no to each - every day NO YES  Have you had a fever in the past 24 hours?  . Fever (Temp > 37.80C / 100F) X   Have you had any of these symptoms in the past 24 hours? . New Cough .  Sore Throat  .  Shortness of Breath .  Difficulty Breathing .  Unexplained Body Aches   X   Have you had any one of these symptoms in the past 24 hours not related to allergies?   . Runny Nose .  Nasal Congestion .  Sneezing   X   If you have had runny nose, nasal congestion, sneezing in the past 24 hours, has it worsened?  X   EXPOSURES - check yes or no X   Have you traveled outside the state in the past 14 days?  X   Have you been in contact with someone with a confirmed diagnosis of COVID-19 or PUI in the past 14 days without wearing appropriate PPE?  X   Have you been living in the same home as a person with confirmed diagnosis of COVID-19 or a PUI (household contact)?    X   Have you been diagnosed with COVID-19?    X              What to do next: Answered NO to all: Answered YES to anything:   Proceed with unit schedule Follow the [facility] Inpatient Flowsheet.   

## 2020-07-04 NOTE — Progress Notes (Signed)
Adult Psychoeducational Group Note  Date:  07/04/2020 Time:  9:49 PM  Group Topic/Focus:  Wrap-Up Group:   The focus of this group is to help patients review their daily goal of treatment and discuss progress on daily workbooks.  Participation Level:  Active  Participation Quality:  Appropriate  Affect:  Labile  Cognitive:  Oriented  Insight: Limited  Engagement in Group:  Engaged  Modes of Intervention:  Education and Socialization  Additional Comments:  Patient attended and participated in group tonight. He reports that today he rested all day. The rest was most significant of his day.  [name] [facility] 07/04/2020, 9:49 PM

## 2020-07-05 MED ORDER — IBUPROFEN 600 MG PO TABS
600.0000 mg | ORAL_TABLET | Freq: Four times a day (QID) | ORAL | Status: DC | PRN
  Administered 2020-07-05 – 2020-07-11 (×10): 600 mg via ORAL
  Filled 2020-07-05 (×11): qty 1

## 2020-07-05 MED ORDER — GABAPENTIN 300 MG PO CAPS
300.0000 mg | ORAL_CAPSULE | Freq: Three times a day (TID) | ORAL | Status: DC
Start: 2020-07-05 — End: 2020-07-12
  Administered 2020-07-05 – 2020-07-12 (×19): 300 mg via ORAL
  Filled 2020-07-05 (×2): qty 63
  Filled 2020-07-05 (×10): qty 1
  Filled 2020-07-05 (×2): qty 63
  Filled 2020-07-05 (×3): qty 1
  Filled 2020-07-05: qty 63
  Filled 2020-07-05 (×11): qty 1
  Filled 2020-07-05: qty 63

## 2020-07-05 MED ORDER — DICYCLOMINE HCL 20 MG PO TABS
20.0000 mg | ORAL_TABLET | Freq: Three times a day (TID) | ORAL | Status: DC | PRN
  Administered 2020-07-05 – 2020-07-07 (×4): 20 mg via ORAL
  Filled 2020-07-05 (×4): qty 1

## 2020-07-05 MED ORDER — QUETIAPINE FUMARATE 50 MG PO TABS
50.0000 mg | ORAL_TABLET | Freq: Every evening | ORAL | Status: DC | PRN
Start: 2020-07-05 — End: 2020-07-06
  Administered 2020-07-05: 50 mg via ORAL
  Filled 2020-07-05: qty 1

## 2020-07-05 NOTE — Progress Notes (Signed)
Adult Psychoeducational Group Note  Date:  07/05/2020 Time:  10:10 PM  Group Topic/Focus:  Wrap Up   Participation Level:  Active  Participation Quality:  Appropriate  Affect:  Appropriate  Cognitive:  Alert  Insight: Improving  Engagement in Group:  Engaged  Modes of Intervention:  Clarification, Discussion and Education  Additional Comments:  Patient participated in group. We discussed relationships and he was able to share insight with peers.  Steve Henry, [place] 07/05/2020, 10:10 PM

## 2020-07-05 NOTE — Progress Notes (Signed)
Recreation Therapy Notes  Date:  1.24.22 Time: 0930 Location: [address] Hall Group Room  Group Topic: Stress Management  Goal Area(s) Addresses:  Patient will identify positive stress management techniques. Patient will identify benefits of using stress management post d/c.  Behavioral Response: Engaged  Intervention: Stress Management  Activity: Meditation.  LRT played a meditation that focused on making the most of your day.  Patients are to follow along as meditation is plays to fully engage in the meditation.  Education:  Stress Management, Discharge Planning.   Education Outcome: Acknowledges Education  Clinical Observations/Feedback: Pt attended and participated in activity.  Pt was focused and appeared attentive to the meditation.  Pt expressed afterwards that the meditation really worked and helped him get to a clearer mental space.    Victorino Sparrow, LRT/CTRS    Victorino Sparrow A 07/05/2020 11:06 AM

## 2020-07-05 NOTE — [facility] Group Notes (Signed)
[place] LCSW Group Therapy  07/05/2020 2:13 PM  Type of Therapy:  Group Therapy: Stress Management  Participation Level:  Active  Summary of Progress/Problems: Pt received handout for group session. Pt shared in introductions that huis favorite color is red because it is bright. Pt shared that trying to find money to bury a loved one and his friend dying are things that have caused him stress. Pt shared that he currently gets high to cope with stress but would like to begin reading instead. Pt discussed worries about leaving [facility] and his discharge plan.  Mliss Fritz 07/05/2020, 2:13 PM

## 2020-07-05 NOTE — Progress Notes (Signed)
D:  Patient denied SI and HI, contracts for safety.  Denied A/V hallucinations.  Denied pain. A:  Medications administered per MD orders.  Emotional support and encouragement given patient. R:  Safety maintained with 15 minute checks.  

## 2020-07-05 NOTE — Progress Notes (Signed)
Patient ID: Steve Henry, male   DOB: Aug 25, 1980, 40 y.o.   MRN: [id number]    [facility] MD Progress Note  07/05/2020 12:43 PM Steve Henry  MRN:  [id number] Subjective:  40 year old male presenting with Depression, si and polysubstance abuse. Principal Problem: Major depressive disorder, recurrent severe without psychotic features ([place]) Diagnosis: Principal Problem:   Major depressive disorder, recurrent severe without psychotic features ([place]) Active Problems:   Opiate dependence ([place])   Cocaine abuse ([place])  Total Time spent with patient: 30 minutes  Daily Note:  Patient seen,chart reviewed and case discussed with treatment team.  Patient reports feeling as if he is still experiencing withdrawal symptoms. He reports feeling as if he still has cravings and chills as well as occasional upset stomach.   Regarding his mood, patient still reports feeling depressed and at times suicidal. He denies any AVH. He is agreeable to medication changes to target his mood.    Past Medical History:  Past Medical History:  Diagnosis Date  . Chronic back pain   . Chronic hand pain   . DDD (degenerative disc disease), cervical   . De Quervain's tenosynovitis   . Drug-seeking behavior   . Narcotic dependence ([place])     Past Surgical History:  Procedure Laterality Date  . MASS EXCISION N/A 03/13/2018   Procedure: EXCISION LIPOMA SCALP;  Surgeon: Aviva Signs, MD;  Location: [facility];  Service: General;  Laterality: N/A;  pt knows to arrive at 6:15  . SPINAL FUSION     Family History:  Family History  Family history unknown: Yes    Social History:  Social History   Substance and Sexual Activity  Alcohol Use No     Social History   Substance and Sexual Activity  Drug Use Yes   Comment: heroin and Fentanyl    Social History   Socioeconomic History  . Marital status: Legally Separated    Spouse name: Not on file  . Number of children: Not on file  . Years of education: Not on  file  . Highest education level: Not on file  Occupational History  . Not on file  Tobacco Use  . Smoking status: Current Every Day Smoker    Packs/day: 0.50    Years: 16.00    Pack years: 8.00    Types: Cigarettes  . Smokeless tobacco: Never Used  Vaping Use  . Vaping Use: Never used  Substance and Sexual Activity  . Alcohol use: No  . Drug use: Yes    Comment: heroin and Fentanyl  . Sexual activity: Yes    Birth control/protection: None  Other Topics Concern  . Not on file  Social History Narrative  . Not on file   Social Determinants of Health   Financial Resource Strain: Not on file  Food Insecurity: Not on file  Transportation Needs: Not on file  Physical Activity: Not on file  Stress: Not on file  Social Connections: Not on file   Additional Social History:                         Sleep: Fair  Appetite:  Fair  Current Medications: Current Facility-Administered Medications  Medication Dose Route Frequency Provider Last Rate Last Admin  . acetaminophen (TYLENOL) tablet 650 mg  650 mg Oral Q6H PRN Sharma Covert, MD   650 mg at 07/02/20 2035  . alum & mag hydroxide-simeth (MAALOX/MYLANTA) 200-200-20 MG/5ML suspension 30 mL  30  mL Oral Q4H PRN Sharma Covert, MD   30 mL at 07/04/20 1012  . cloNIDine (CATAPRES) tablet 0.1 mg  0.1 mg Oral Q4H PRN Sharma Covert, MD   0.1 mg at 07/04/20 2057  . cloNIDine (CATAPRES) tablet 0.1 mg  0.1 mg Oral Daily Patrecia Pour, NP   0.1 mg at 07/05/20 0753  . dicyclomine (BENTYL) tablet 20 mg  20 mg Oral TID PRN Devanie Galanti, Dorene Ar, MD   20 mg at 07/05/20 1016  . escitalopram (LEXAPRO) tablet 10 mg  10 mg Oral Daily Acel Natzke, Dorene Ar, MD   10 mg at 07/04/20 0739  . gabapentin (NEURONTIN) capsule 300 mg  300 mg Oral TID Dixie Dials, MD   300 mg at 07/05/20 1207  . ibuprofen (ADVIL) tablet 600 mg  600 mg Oral Q6H PRN Jobeth Pangilinan, Dorene Ar, MD   600 mg at 07/05/20 1012  . magnesium hydroxide (MILK OF  MAGNESIA) suspension 30 mL  30 mL Oral Daily PRN Sharma Covert, MD      . nicotine (NICODERM CQ - dosed in mg/24 hours) patch 21 mg  21 mg Transdermal Daily Sharma Covert, MD   21 mg at 07/05/20 0754  . QUEtiapine (SEROQUEL) tablet 50 mg  50 mg Oral QHS PRN Jaeleen Inzunza, Dorene Ar, MD        Lab Results: No results found for this or any previous visit (from the past 48 hour(s)).  Blood Alcohol level:  Lab Results  Component Value Date   ETH <10 06/28/2020   ETH <10 32/95/1884    Metabolic Disorder Labs: No results found for: HGBA1C, MPG No results found for: PROLACTIN No results found for: CHOL, TRIG, HDL, CHOLHDL, VLDL, LDLCALC  Physical Findings: AIMS: Facial and Oral Movements Muscles of Facial Expression: None, normal Lips and Perioral Area: None, normal Jaw: None, normal Tongue: None, normal,Extremity Movements Upper (arms, wrists, hands, fingers): None, normal Lower (legs, knees, ankles, toes): None, normal, Trunk Movements Neck, shoulders, hips: None, normal, Overall Severity Severity of abnormal movements (highest score from questions above): None, normal Incapacitation due to abnormal movements: None, normal Patient's awareness of abnormal movements (rate only patient's report): No Awareness, Dental Status Current problems with teeth and/or dentures?: No Does patient usually wear dentures?: No  CIWA:    COWS:  COWS Total Score: 3  Musculoskeletal: Strength & Muscle Tone: within normal limits Gait & Station: normal Patient leans: N/A  Psychiatric Specialty Exam: Physical Exam HENT:     Head: Normocephalic.     Mouth/Throat:     Mouth: Mucous membranes are moist.  Eyes:     Pupils: Pupils are equal, round, and reactive to light.  Musculoskeletal:        General: Normal range of motion.  Skin:    General: Skin is warm.  Neurological:     General: No focal deficit present.     Mental Status: He is alert.     Review of Systems  Constitutional:  Positive for chills and fatigue.  Eyes: Negative for discharge.  Cardiovascular: Negative for chest pain.  Gastrointestinal: Negative for abdominal distention.  Genitourinary: Negative for difficulty urinating.  Psychiatric/Behavioral: Negative for agitation.    Blood pressure (!) 127/93, pulse (!) 104, temperature 97.8 F (36.6 C), temperature source Oral, resp. rate 18, height 5\' 3"  (1.6 m), weight 61.5 kg, SpO2 100 %.Body mass index is 24 kg/m.  General Appearance: Casual  Eye Contact:  Good  Speech:  Slow  Volume:  Decreased  Mood:  Depressed and Dysphoric  Affect:  Congruent  Thought Process:  Coherent  Orientation:  Full (Time, Place, and Person)  Thought Content:  Logical  Suicidal Thoughts:  Yes.  without intent/plan  Homicidal Thoughts:  No  Memory:  Recent;   Fair  Judgement:  Fair  Insight:  Fair  Psychomotor Activity:  Normal  Concentration:  Concentration: Fair  Recall:  [employer] of Knowledge:  Fair  Language:  Fair  Akathisia:  No  Handed:  Right  AIMS (if indicated):     Assets:  Desire for Improvement Resilience  ADL's:  Intact  Cognition:  WNL  Sleep:  Number of Hours: 5.5     Treatment Plan Summary: Daily contact with patient to assess and evaluate symptoms and progress in treatment and Medication management   39 year old male with history of polysubstance abuse, presenting with depressed mood. So far patient has been in good behavioral control and shows desire to come off of drugs and obtain psychiatric treatment. His SI is still persisting at this time. Diagnosis remains MDD without psychotic features.    Continue with Lexapro 10 mg po qam Seroquel increased to 150mg  QHS Gabapentin increased to 300mg  TID PRN Mylanta, Imodium, Bentyl, -Dispo planning: rehab   Dixie Dials, MD 07/05/2020, 12:43 PM

## 2020-07-05 NOTE — Tx Team (Signed)
Interdisciplinary Treatment and Diagnostic Plan Update  07/05/2020 Time of Session: 9am Steve Henry MRN: [id number]  Principal Diagnosis: Major depressive disorder, recurrent severe without psychotic features ([place])  Secondary Diagnoses: Principal Problem:   Major depressive disorder, recurrent severe without psychotic features ([place]) Active Problems:   Opiate dependence ([place])   Cocaine abuse ([place])   Current Medications:  Current Facility-Administered Medications  Medication Dose Route Frequency Provider Last Rate Last Admin  . acetaminophen (TYLENOL) tablet 650 mg  650 mg Oral Q6H PRN Sharma Covert, MD   650 mg at 07/02/20 2035  . alum & mag hydroxide-simeth (MAALOX/MYLANTA) 200-200-20 MG/5ML suspension 30 mL  30 mL Oral Q4H PRN Sharma Covert, MD   30 mL at 07/04/20 1012  . cloNIDine (CATAPRES) tablet 0.1 mg  0.1 mg Oral Q4H PRN Sharma Covert, MD   0.1 mg at 07/04/20 2057  . cloNIDine (CATAPRES) tablet 0.1 mg  0.1 mg Oral Daily Patrecia Pour, NP   0.1 mg at 07/05/20 0753  . dicyclomine (BENTYL) tablet 20 mg  20 mg Oral TID PRN Cristofano, Dorene Ar, MD   20 mg at 07/05/20 1016  . escitalopram (LEXAPRO) tablet 10 mg  10 mg Oral Daily Cristofano, Dorene Ar, MD   10 mg at 07/04/20 0739  . gabapentin (NEURONTIN) capsule 300 mg  300 mg Oral TID Cristofano, Paul A, MD      . ibuprofen (ADVIL) tablet 600 mg  600 mg Oral Q6H PRN Cristofano, Dorene Ar, MD   600 mg at 07/05/20 1012  . magnesium hydroxide (MILK OF MAGNESIA) suspension 30 mL  30 mL Oral Daily PRN Sharma Covert, MD      . nicotine (NICODERM CQ - dosed in mg/24 hours) patch 21 mg  21 mg Transdermal Daily Sharma Covert, MD   21 mg at 07/05/20 0754  . QUEtiapine (SEROQUEL) tablet 50 mg  50 mg Oral QHS PRN Cristofano, Dorene Ar, MD       PTA Medications: Medications Prior to Admission  Medication Sig Dispense Refill Last Dose  . Buprenorphine HCl-Naloxone HCl 8-2 MG FILM Place 1 Film under the tongue in the morning  and at bedtime. 11 days supply filled 12/15 (Patient not taking: Reported on 06/30/2020)   Not Taking at Unknown time    Patient Stressors: Financial difficulties Marital or family conflict Substance abuse Other: homeless  Patient Strengths: [employer] fund of knowledge Physical Health  Treatment Modalities: Medication Management, Group therapy, Case management,  1 to 1 session with clinician, Psychoeducation, Recreational therapy.   Physician Treatment Plan for Primary Diagnosis: Major depressive disorder, recurrent severe without psychotic features ([place]) Long Term Goal(s): Improvement in symptoms so as ready for discharge Improvement in symptoms so as ready for discharge   Short Term Goals: Ability to identify changes in lifestyle to reduce recurrence of condition will improve Ability to verbalize feelings will improve Ability to disclose and discuss suicidal ideas Ability to demonstrate self-control will improve Ability to identify and develop effective coping behaviors will improve Ability to maintain clinical measurements within normal limits will improve Compliance with prescribed medications will improve Ability to identify triggers associated with substance abuse/mental health issues will improve Ability to identify changes in lifestyle to reduce recurrence of condition will improve Ability to verbalize feelings will improve Ability to disclose and discuss suicidal ideas Ability to demonstrate self-control will improve Ability to identify and develop effective coping behaviors will improve Ability to maintain clinical measurements within normal limits  will improve Compliance with prescribed medications will improve Ability to identify triggers associated with substance abuse/mental health issues will improve  Medication Management: Evaluate patient's response, side effects, and tolerance of medication regimen.  Therapeutic Interventions: 1 to 1  sessions, Unit Group sessions and Medication administration.  Evaluation of Outcomes: Progressing  Physician Treatment Plan for Secondary Diagnosis: Principal Problem:   Major depressive disorder, recurrent severe without psychotic features ([place]) Active Problems:   Opiate dependence ([place])   Cocaine abuse ([place])  Long Term Goal(s): Improvement in symptoms so as ready for discharge Improvement in symptoms so as ready for discharge   Short Term Goals: Ability to identify changes in lifestyle to reduce recurrence of condition will improve Ability to verbalize feelings will improve Ability to disclose and discuss suicidal ideas Ability to demonstrate self-control will improve Ability to identify and develop effective coping behaviors will improve Ability to maintain clinical measurements within normal limits will improve Compliance with prescribed medications will improve Ability to identify triggers associated with substance abuse/mental health issues will improve Ability to identify changes in lifestyle to reduce recurrence of condition will improve Ability to verbalize feelings will improve Ability to disclose and discuss suicidal ideas Ability to demonstrate self-control will improve Ability to identify and develop effective coping behaviors will improve Ability to maintain clinical measurements within normal limits will improve Compliance with prescribed medications will improve Ability to identify triggers associated with substance abuse/mental health issues will improve     Medication Management: Evaluate patient's response, side effects, and tolerance of medication regimen.  Therapeutic Interventions: 1 to 1 sessions, Unit Group sessions and Medication administration.  Evaluation of Outcomes: Progressing   RN Treatment Plan for Primary Diagnosis: Major depressive disorder, recurrent severe without psychotic features ([place]) Long Term Goal(s): Knowledge of disease and therapeutic  regimen to maintain health will improve  Short Term Goals: Ability to demonstrate self-control, Ability to verbalize feelings will improve and Ability to identify and develop effective coping behaviors will improve  Medication Management: RN will administer medications as ordered by provider, will assess and evaluate patient's response and provide education to patient for prescribed medication. RN will report any adverse and/or side effects to prescribing provider.  Therapeutic Interventions: 1 on 1 counseling sessions, Psychoeducation, Medication administration, Evaluate responses to treatment, Monitor vital signs and CBGs as ordered, Perform/monitor CIWA, COWS, AIMS and Fall Risk screenings as ordered, Perform wound care treatments as ordered.  Evaluation of Outcomes: Progressing   LCSW Treatment Plan for Primary Diagnosis: Major depressive disorder, recurrent severe without psychotic features ([place]) Long Term Goal(s): Safe transition to appropriate next level of care at discharge, Engage patient in therapeutic group addressing interpersonal concerns.  Short Term Goals: Engage patient in aftercare planning with referrals and resources, Facilitate acceptance of mental health diagnosis and concerns and Facilitate patient progression through stages of change regarding substance use diagnoses and concerns  Therapeutic Interventions: Assess for all discharge needs, 1 to 1 time with Social worker, Explore available resources and support systems, Assess for adequacy in community support network, Educate family and significant other(s) on suicide prevention, Complete Psychosocial Assessment, Interpersonal group therapy.  Evaluation of Outcomes: Progressing   Progress in Treatment: Attending groups: Yes. Participating in groups: Yes. Taking medication as prescribed: Yes. Toleration medication: Yes. Family/Significant other contact made: No, will contact:  patient declined consents Patient  understands diagnosis: Yes. Discussing patient identified problems/goals with staff: Yes. Medical problems stabilized or resolved: Yes. Denies suicidal/homicidal ideation: Yes. Issues/concerns per patient self-inventory: No. Other: None  New problem(s) identified: No, Describe:  none  New Short Term/Long Term Goal(s):medication stabilization, elimination of SI thoughts, development of comprehensive mental wellness plan.  Patient Goals:  "get better  Discharge Plan or Barriers: Patient is being referred to residential substance use treatment for continued care at discharge.  Reason for Continuation of Hospitalization: Depression Medication stabilization Withdrawal symptoms  Estimated Length of Stay: 3-5 days   Attendees: Patient:  07/05/2020   Physician:  07/05/2020   Nursing:  07/05/2020  RN Care Manager: 07/05/2020  Social Worker: Darletta Moll, LCSW 07/05/2020   Recreational Therapist:  07/05/2020   Other:  07/05/2020   Other:  07/05/2020   Other: 07/05/2020     Scribe for Treatment Team: Vassie Moselle, LCSW 07/05/2020 10:28 AM

## 2020-07-05 NOTE — Progress Notes (Signed)
   07/04/20 2100  COVID-19 Daily Checkoff  Have you had a fever (temp > 37.80C/100F)  in the past 24 hours?  No  If you have had runny nose, nasal congestion, sneezing in the past 24 hours, has it worsened? No  COVID-19 EXPOSURE  Have you traveled outside the state in the past 14 days? No  Have you been in contact with someone with a confirmed diagnosis of COVID-19 or PUI in the past 14 days without wearing appropriate PPE? No  Have you been living in the same home as a person with confirmed diagnosis of COVID-19 or a PUI (household contact)? No  Have you been diagnosed with COVID-19? No

## 2020-07-05 NOTE — [facility] Group Notes (Signed)
Adult Psychoeducational Group Note  Date:  07/05/2020 Time:  9:20 AM  Group Topic/Focus:  Goals Group:   The focus of this group is to help patients establish daily goals to achieve during treatment and discuss how the patient can incorporate goal setting into their daily lives to aide in recovery.  Participation Level:  Active  Participation Quality:  Appropriate and Attentive  Affect:  Appropriate  Cognitive:  Alert and Appropriate  Insight: Appropriate and Good  Engagement in Group:  Engaged  Modes of Intervention:  Discussion  Additional Comments:  Pt has a goal of getting one step closer to being sober. Staying clean one day at a time is the way the pt looks at it. Being at [facility] helps him complete that goal. Pt has also requested to start working on a D/C plan with the social worker and the MD.   Steve Henry 07/05/2020, 9:20 AM

## 2020-07-05 NOTE — Plan of Care (Signed)
Nurse discussed anxiety, depression and coping skills with patient.  

## 2020-07-05 NOTE — [facility] Group Notes (Signed)
Occupational Therapy Group Note Date: 07/05/2020 Group Topic/Focus: Communication Skills  Group Description: Group encouraged increased engagement and participation through discussion focused on communication styles. Patients were educated on the different styles of communication including passive, aggressive, assertive, and passive-aggressive communication. Group members shared and reflected on which styles they most often find themselves communicating in and brainstormed strategies on how to transition and practice a more assertive approach. Further discussion explored how to use assertiveness skills and strategies to further advocate and ask questions as it relates to their treatment plan and mental health.   Therapeutic Goal(s): Identify practical strategies to improve communication skills  Identify how to use assertive communication skills to address individual needs and wants Participation Level: Hyperverbal   Participation Quality: Moderate Cues   Behavior: Hyperverbal and Inappropriate   Speech/Thought Process: Distracted, Focused and Unfocused   Affect/Mood: Full range   Insight: Limited   Judgement: Limited   Individualization: Pt introduced themselves as "Steve Henry" pronounced "Romeo" and was in full attendance of group, however actively engaged in conversation with peer for duration. Pt spoke about his past experiences with substance use and stated that he does not think he is currently an addict and denied interest in rehab or aftercare for substance use/chemical dependence. Pt shared that he has open communication and denied any concerns. Pt offered suggestions and feedback to other peers in the group who were struggling with feeling anxious about discharge.   Modes of Intervention: Discussion and Education  Patient Response to Interventions:  Attentive and Challenging   Plan: Continue to engage patient in OT groups 2 - 3x/week.   07/05/2020  Ponciano Ort, MOT, OTR/L

## 2020-07-06 LAB — LIPASE, BLOOD: Lipase: 49 U/L (ref 11–51)

## 2020-07-06 LAB — AMYLASE: Amylase: 157 U/L — ABNORMAL HIGH (ref 28–100)

## 2020-07-06 LAB — SARS CORONAVIRUS 2 (TAT 6-24 HRS): SARS Coronavirus 2: NEGATIVE

## 2020-07-06 MED ORDER — ENSURE ENLIVE PO LIQD
237.0000 mL | Freq: Two times a day (BID) | ORAL | Status: DC
  Administered 2020-07-06 – 2020-07-08 (×3): 237 mL via ORAL
  Filled 2020-07-06 (×16): qty 237

## 2020-07-06 MED ORDER — ONDANSETRON HCL 4 MG PO TABS
4.0000 mg | ORAL_TABLET | Freq: Three times a day (TID) | ORAL | Status: DC
  Administered 2020-07-07 – 2020-07-12 (×13): 4 mg via ORAL
  Filled 2020-07-06 (×24): qty 1

## 2020-07-06 MED ORDER — QUETIAPINE FUMARATE 100 MG PO TABS
100.0000 mg | ORAL_TABLET | Freq: Every evening | ORAL | Status: DC | PRN
Start: 2020-07-06 — End: 2020-07-11
  Administered 2020-07-06 – 2020-07-10 (×5): 100 mg via ORAL
  Filled 2020-07-06 (×5): qty 1

## 2020-07-06 MED ORDER — PRAZOSIN HCL 1 MG PO CAPS
1.0000 mg | ORAL_CAPSULE | Freq: Every day | ORAL | Status: DC
  Administered 2020-07-06 – 2020-07-11 (×6): 1 mg via ORAL
  Filled 2020-07-06 (×7): qty 1
  Filled 2020-07-06 (×2): qty 21

## 2020-07-06 NOTE — Progress Notes (Addendum)
[facility] MD Progress Note  07/06/2020 12:44 PM Steve Henry  MRN:  [id number] Subjective:  This AM patient reports that he is still feeling the affects of withdrawal as he continues to have diarrhea, abdominal cramping, sweating, elevated HR, irritability, and agitation. Patient reports that his appetite is poor because each time he eats he has diarrhea and he has no urge. Patient reports that he will ingest liquids but is not motivated to eat solid foods as this time. Patient also endorses olfactory aversion to foods at this time as well. Patient continues to endorse dysphoric mood. Patient continues to endorse poor sleep although he does report that he felt he slept more than the night before. Patient reports that he has ben having very strange and upsetting dreams. Patient reports that he awoke in tears early this AM due to one his strange dreams. Patient endorses he has has SI although less frequently than at presentation. Patient reports he finds himself thinking "they [my family] would be better off without me here." Patient reports that he feels that he has failed everyone including himself because he became addicted to opioids after his surgery. Patient reports that he regrets getting the procedure. Patient reports that he does not want to acknowledge that he was "one of those people swept up in the opioid epidemic." Patient reports that he thinks that the "Black community" will forever label him as a "drug addict." Patient reports that he has not been compliant with his Lexapro because he thought it was making him feel "groggy" however patient realizes during our conversation that he still feels this way and he does want to take something for his dysphoric mood, so he will be compliant with his Lexapro. Patient does contract for safety and does not endorse HI nor AH. Patient reports that there was a moment yesterday where he thought he saw a door in his peripheral vision while laying in bed, but when he  turned it was no longer there.  Principal Problem: Major depressive disorder, recurrent severe without psychotic features ([place]) Diagnosis: Principal Problem:   Major depressive disorder, recurrent severe without psychotic features ([place]) Active Problems:   Opiate dependence ([place])   Cocaine abuse ([place])  Total Time spent with patient: 20 minutes  Past Psychiatric History: See H&P  Past Medical History:  Past Medical History:  Diagnosis Date  . Chronic back pain   . Chronic hand pain   . DDD (degenerative disc disease), cervical   . De Quervain's tenosynovitis   . Drug-seeking behavior   . Narcotic dependence ([place])     Past Surgical History:  Procedure Laterality Date  . MASS EXCISION N/A 03/13/2018   Procedure: EXCISION LIPOMA SCALP;  Surgeon: Aviva Signs, MD;  Location: [facility];  Service: General;  Laterality: N/A;  pt knows to arrive at 6:15  . SPINAL FUSION     Family History:  Family History  Family history unknown: Yes   Family Psychiatric  History: See H&P Social History:  Social History   Substance and Sexual Activity  Alcohol Use No     Social History   Substance and Sexual Activity  Drug Use Yes   Comment: heroin and Fentanyl    Social History   Socioeconomic History  . Marital status: Legally Separated    Spouse name: Not on file  . Number of children: Not on file  . Years of education: Not on file  . Highest education level: Not on file  Occupational History  . Not  on file  Tobacco Use  . Smoking status: Current Every Day Smoker    Packs/day: 0.50    Years: 16.00    Pack years: 8.00    Types: Cigarettes  . Smokeless tobacco: Never Used  Vaping Use  . Vaping Use: Never used  Substance and Sexual Activity  . Alcohol use: No  . Drug use: Yes    Comment: heroin and Fentanyl  . Sexual activity: Yes    Birth control/protection: None  Other Topics Concern  . Not on file  Social History Narrative  . Not on file   Social Determinants of Health    Financial Resource Strain: Not on file  Food Insecurity: Not on file  Transportation Needs: Not on file  Physical Activity: Not on file  Stress: Not on file  Social Connections: Not on file   Additional Social History:                         Sleep: Poor  Appetite:  Poor  Current Medications: Current Facility-Administered Medications  Medication Dose Route Frequency Provider Last Rate Last Admin  . acetaminophen (TYLENOL) tablet 650 mg  650 mg Oral Q6H PRN Sharma Covert, MD   650 mg at 07/02/20 2035  . alum & mag hydroxide-simeth (MAALOX/MYLANTA) 200-200-20 MG/5ML suspension 30 mL  30 mL Oral Q4H PRN Sharma Covert, MD   30 mL at 07/04/20 1012  . cloNIDine (CATAPRES) tablet 0.1 mg  0.1 mg Oral Daily Patrecia Pour, NP   0.1 mg at 07/06/20 0817  . dicyclomine (BENTYL) tablet 20 mg  20 mg Oral TID PRN Dixie Dials, MD   20 mg at 07/05/20 2111  . escitalopram (LEXAPRO) tablet 10 mg  10 mg Oral Daily Cristofano, Dorene Ar, MD   10 mg at 07/06/20 0817  . feeding supplement (ENSURE ENLIVE / ENSURE PLUS) liquid 237 mL  237 mL Oral BID BM Edita Weyenberg B, MD      . gabapentin (NEURONTIN) capsule 300 mg  300 mg Oral TID Dixie Dials, MD   300 mg at 07/06/20 0817  . ibuprofen (ADVIL) tablet 600 mg  600 mg Oral Q6H PRN Dixie Dials, MD   600 mg at 07/06/20 0817  . magnesium hydroxide (MILK OF MAGNESIA) suspension 30 mL  30 mL Oral Daily PRN Sharma Covert, MD      . nicotine (NICODERM CQ - dosed in mg/24 hours) patch 21 mg  21 mg Transdermal Daily Sharma Covert, MD   21 mg at 07/06/20 0817  . QUEtiapine (SEROQUEL) tablet 50 mg  50 mg Oral QHS PRN Cristofano, Dorene Ar, MD   50 mg at 07/05/20 2207    Lab Results:  Results for orders placed or performed during the hospital encounter of 06/29/20 (from the past 48 hour(s))  SARS CORONAVIRUS 2 (TAT 6-24 HRS) Nasopharyngeal Nasopharyngeal Swab     Status: None   Collection Time: 07/05/20  9:16 AM    Specimen: Nasopharyngeal Swab  Result Value Ref Range   SARS Coronavirus 2 NEGATIVE NEGATIVE    Comment: (NOTE) SARS-CoV-2 target nucleic acids are NOT DETECTED.  The SARS-CoV-2 RNA is generally detectable in upper and lower respiratory specimens during the acute phase of infection. Negative results do not preclude SARS-CoV-2 infection, do not rule out co-infections with other pathogens, and should not be used as the sole basis for treatment or other patient management decisions. Negative results must be combined  with clinical observations, patient history, and epidemiological information. The expected result is Negative.  Fact Sheet for Patients: SugarRoll.be  Fact Sheet for Healthcare Providers: https://www.woods-mathews.com/  This test is not yet approved or cleared by the [place] FDA and  has been authorized for detection and/or diagnosis of SARS-CoV-2 by FDA under an Emergency Use Authorization (EUA). This EUA will remain  in effect (meaning this test can be used) for the duration of the COVID-19 declaration under Se ction 564(b)(1) of the Act, 21 U.S.C. section 360bbb-3(b)(1), unless the authorization is terminated or revoked sooner.  Performed at [facility], [place] [address], [place], [place] [address]     Blood Alcohol level:  Lab Results  Component Value Date   ETH <10 06/28/2020   ETH <10 [id number]    Metabolic Disorder Labs: No results found for: HGBA1C, MPG No results found for: PROLACTIN No results found for: CHOL, TRIG, HDL, CHOLHDL, VLDL, LDLCALC  Physical Findings: AIMS: Facial and Oral Movements Muscles of Facial Expression: None, normal Lips and Perioral Area: None, normal Jaw: None, normal Tongue: None, normal,Extremity Movements Upper (arms, wrists, hands, fingers): None, normal Lower (legs, knees, ankles, toes): None, normal, Trunk Movements Neck, shoulders, hips: None, normal, Overall  Severity Severity of abnormal movements (highest score from questions above): None, normal Incapacitation due to abnormal movements: None, normal Patient's awareness of abnormal movements (rate only patient's report): No Awareness, Dental Status Current problems with teeth and/or dentures?: No Does patient usually wear dentures?: No  CIWA:    COWS:  COWS Total Score: 3  Musculoskeletal: Strength & Muscle Tone: within normal limits Gait & Station: normal Patient leans: N/A  Psychiatric Specialty Exam: Physical Exam Constitutional:      Appearance: Normal appearance.  HENT:     Head: Normocephalic and atraumatic.  Pulmonary:     Effort: Pulmonary effort is normal.  Neurological:     Mental Status: He is alert.     Review of Systems  Constitutional: Positive for appetite change and diaphoresis.  Gastrointestinal: Positive for abdominal pain and diarrhea.  Psychiatric/Behavioral: Positive for dysphoric mood.    Blood pressure 114/84, pulse (!) 102, temperature 97.6 F (36.4 C), temperature source Oral, resp. rate 16, height 5\' 3"  (1.6 m), weight 61.5 kg, SpO2 100 %.Body mass index is 24 kg/m.  General Appearance: Casual  Eye Contact:  Good  Speech:  Clear and Coherent  Volume:  Normal  Mood:  Dysphoric  Affect:  Depressed  Thought Process:  Coherent  Orientation:  NA  Thought Content:  Logical and Rumination on his past opioid dependence and stigma associated with substance use disorders  Suicidal Thoughts:  No  Homicidal Thoughts:  No  Memory:  Recent;   Good  Judgement:  Other:  Improving  Insight:  Shallow  Psychomotor Activity:  Normal  Concentration:  Concentration: Good  Recall:  NA  Fund of Knowledge:  Good  Language:  Good  Akathisia:  No    AIMS (if indicated):     Assets:  Communication Skills Desire for Improvement Housing Intimacy Resilience Social Support  ADL's:  Intact  Cognition:  WNL  Sleep:  Number of Hours: 5.75     Treatment Plan  Summary: Daily contact with patient to assess and evaluate symptoms and progress in treatment Steve Henry is a 40 yo patient who is being treated for MDD, severe, w/o psychotic features, Cocaine abuse, and opiate dependence and is currently going through opiate withdrawal. Patient is on day 6 of  his withdrawal and continues to have strong symptoms. Despite medication patient continues to have a hard time eating 2/2 withdrawal symptoms and dysphoric mood. Will continue to monitor patient PO intake and patient is agreeable to Ensure. Patient also acknowledged that he continues to have dysphoric mood and this is only made worse by his negative thoughts about his opioid dependence and recovery. Patient is being especially hard on himself, but was willing to agree to continue his Lexapro as patient and MD agreed that his groggy feeling may be 2/2 to withdrawal. Patient has otherwise remained compliant with meds and is interacting more on the unit. Will continue to work to help patient increase his sleep as this will also positively affect his mood.   MDD, recurrent, severe, w/o psychotic features Insomnia - Continue Lexapro 10mg  - Gabapentin 300mg   TID - Increase Seroquel to 100mg  QHS - Start Prazosin 1mg  QHS - Ensure BID - EKG  Opiate withdrawal - Clonidine 0.1mg  daily - Bentyl 20mg  TID PRN  Cocaine abuse  Tobacco use disorder -Nicotine patch 21mg  daily  PRN -Tylenol 650mg  q6h, pain -Maalox 36ml q4h, indigestion -Atarax 25mg  TID, anxiety -Milk of Mag 42mL, constipation - Advil 600mg  q6h  PGY-1 Freida Busman, MD 07/06/2020, 12:44 PM

## 2020-07-06 NOTE — Progress Notes (Signed)
Adult Psychoeducational Group Note  Date:  07/06/2020 Time:  10:57 PM  Group Topic/Focus:  Wrap Up Group  Participation Level:  Active  Participation Quality:  Redirectable and Sharing  Affect:  Angry, Blunted and Irritable  Cognitive:  Appropriate  Insight: Improving  Engagement in Group:  Engaged  Modes of Intervention:  Clarification, Discussion, Education and Support  Additional Comments:  Pt attended group and shared some of his frustrations he has experienced. He rated is day 6/10.  [place], [place] 07/06/2020, 10:57 PM

## 2020-07-06 NOTE — Progress Notes (Signed)
   07/05/20 2000  Psych Admission Type (Psych Patients Only)  Admission Status Voluntary  Psychosocial Assessment  Patient Complaints None  Eye Contact Brief  Facial Expression Other (Comment) (Calm)  Affect Appropriate to circumstance  Speech Logical/coherent  Interaction Assertive  Motor Activity Slow  Appearance/Hygiene Unremarkable  Behavior Characteristics Cooperative;Calm  Mood Pleasant;Euthymic  Thought Process  Coherency WDL  Content WDL  Delusions None reported or observed  Perception WDL  Hallucination None reported or observed  Judgment Poor  Confusion None  Danger to Self  Current suicidal ideation? Denies  Self-Injurious Behavior No self-injurious ideation or behavior indicators observed or expressed   Agreement Not to Harm Self Yes  Description of Agreement Verbally contracts for safety  Danger to Others  Danger to Others None reported or observed

## 2020-07-06 NOTE — BHH Counselor (Signed)
CSW provided pt with the number for [facility] and asked him to call them and complete a pre-screen.   Steve Henry, [place] Worker [employer]

## 2020-07-06 NOTE — Progress Notes (Addendum)
   07/06/20 0721  Vital Signs  Pulse Rate (!) 102  Pulse Rate Source Dinamap  BP 114/84  BP Location Left Arm  BP Method Automatic  Patient Position (if appropriate) Standing  Oxygen Therapy  SpO2 100 %  O2 Device Room Air   D: Patient admits to some SI without a plan, and no HI. Pt. Denies AVH. Patient out in open areas, attended group and was social with staff. Patient reported that he didn't want to eat lunch because he was not hungry. Pt. Reported chronic lower back pain 6/10 A:  Patient took scheduled medicine. Pt's back pain was relieved by  600 mg of Ibuprofen. Support and encouragement provided Routine safety checks conducted every 15 minutes. Patient  Informed to notify staff with any concerns.   R: Safety maintained  Late Note @1938 : Patient reports that he has not eaten for 4 days. He said he just takes bites, but cannot eat. When asked why pt. Said it just won't go down. Pt. Is swallowing ginger ale just fine.   Pt called [facility] [phone] to pre-register, but when they called him back some issues were not resolved.

## 2020-07-06 NOTE — BHH Counselor (Signed)
CSW called [facility] and learned that pt's referral is still in review.   Toney Reil, [place] Worker [employer]

## 2020-07-06 NOTE — Progress Notes (Signed)
Counseling Intern (CI) saw pt in hall  Pt remembered CI from group yesterday  CI asked how pt was feeling today and pt said that he was feeling good and ready to get back to his kids (showed pictures)   CI can follow up if needed  [place] Intern @ [facility]

## 2020-07-06 NOTE — BHH Group Notes (Signed)
The focus of this group is to help patients establish daily goals to achieve during treatment and discuss how the patient can incorporate goal setting into their daily lives to aide in recovery.   Patient attended group and contributed to group. 

## 2020-07-07 LAB — COMPREHENSIVE METABOLIC PANEL WITH GFR
ALT: 14 U/L (ref 0–44)
AST: 14 U/L — ABNORMAL LOW (ref 15–41)
Albumin: 4 g/dL (ref 3.5–5.0)
Alkaline Phosphatase: 109 U/L (ref 38–126)
Anion gap: 12 (ref 5–15)
BUN: 20 mg/dL (ref 6–20)
CO2: 22 mmol/L (ref 22–32)
Calcium: 9.3 mg/dL (ref 8.9–10.3)
Chloride: 106 mmol/L (ref 98–111)
Creatinine, Ser: 0.86 mg/dL (ref 0.61–1.24)
GFR, Estimated: >60 mL/min
Glucose, Bld: 99 mg/dL (ref 70–99)
Potassium: 4.3 mmol/L (ref 3.5–5.1)
Sodium: 140 mmol/L (ref 135–145)
Total Bilirubin: 0.3 mg/dL (ref 0.3–1.2)
Total Protein: 7.7 g/dL (ref 6.5–8.1)

## 2020-07-07 MED ORDER — ESCITALOPRAM OXALATE 20 MG PO TABS
20.0000 mg | ORAL_TABLET | Freq: Every day | ORAL | Status: DC
  Administered 2020-07-08 – 2020-07-12 (×5): 20 mg via ORAL
  Filled 2020-07-07 (×4): qty 1
  Filled 2020-07-07: qty 14
  Filled 2020-07-07 (×2): qty 1
  Filled 2020-07-07: qty 14

## 2020-07-07 MED ORDER — ESCITALOPRAM OXALATE 10 MG PO TABS
10.0000 mg | ORAL_TABLET | Freq: Once | ORAL | Status: AC
  Administered 2020-07-07: 10 mg via ORAL
  Filled 2020-07-07: qty 1

## 2020-07-07 NOTE — Progress Notes (Addendum)
   07/06/20 2150  Psych Admission Type (Psych Patients Only)  Admission Status Voluntary  Psychosocial Assessment  Patient Complaints Depression  Eye Contact Brief  Facial Expression Anxious  Affect Anxious  Speech Logical/coherent  Interaction Assertive  Motor Activity Slow  Appearance/Hygiene Unremarkable  Behavior Characteristics Cooperative;Anxious  Mood Depressed;Anxious  Thought Process  Coherency WDL  Content WDL  Delusions None reported or observed  Perception WDL  Hallucination None reported or observed  Judgment Poor  Confusion None  Danger to Self  Current suicidal ideation? Denies  Danger to Others  Danger to Others None reported or observed   Pt rates depression 9/10 and reports ongoing chronic back pain 8/10. Pt states he had a "bad day" but wouldn't elaborate as to what was bad about it. Pt denies SI, HI, AVH. Pt denies any other withdrawal symptoms.

## 2020-07-07 NOTE — Progress Notes (Signed)
Patient rated his day as a 10 out 10 since he managed to keep down his ice cream. He states that he has not been able to keep down any food or fluids for days now. His goal for tomorrow is to try to keep all of his food down.

## 2020-07-07 NOTE — BHH Counselor (Signed)
CSW spoke with [facility] and pt has been accepted. His admission will be Monday, 1/31 at [place], [place] Social Worker [employer]

## 2020-07-07 NOTE — BHH Group Notes (Signed)
Type of Therapy:  Group Therapy: Boundaries  Participation Level:  Active  Summary of Progress/Problems: Pt received packet for today's group session. During introductions pt stated their name and that if they could go anywhere in the world they would go to [place] and take no one.  Discussion was had on the definition of boundaries, what healthy boundaries look like, and how to set boundaries. Pt then discussed "My Human Bill of Rights and Corresponding Responsibilities" and share that they felt to live the way he wants is a right and a responsibility is to be held accountable. Pt followed along and participated in group but left before the group ended.  Toney Reil, [place] Worker [employer]

## 2020-07-07 NOTE — Progress Notes (Signed)
Recreation Therapy Notes  Date:  1.26.22 Time: 0930 Location: 300 Hall Group Room  Group Topic: Stress Management  Goal Area(s) Addresses:  Patient will identify positive stress management techniques. Patient will identify benefits of using stress management post d/c.  Behavioral Response: Engaged  Intervention: Stress Management  Activity:  Guided Imagery.  LRT read a script that took patients on a journey through the forest to enjoy the sights and sounds of nature.  Patients were to listen and follow along as LRT read script to engage in activity.    Education:  Stress Management, Discharge Planning.   Education Outcome: Acknowledges Education  Clinical Observations/Feedback: Pt attended and participated in group.    Victorino Sparrow, LRT/CTRS        Victorino Sparrow A 07/07/2020 11:40 AM

## 2020-07-07 NOTE — BHH Counselor (Signed)
CSW contacted [facility] that pt is on the waitlist and was told to call back tomorrow, 07/07/20, to check on a bed.  CSW contacted [facility] and learned that pt is currently under review.  Toney Reil, [place] Worker [employer]

## 2020-07-07 NOTE — Progress Notes (Addendum)
[facility] MD Progress Note  07/07/2020 10:57 AM Steve Henry  MRN:  [id number] Subjective:  Patient reports that he did not sleep well last night and that his sleep was worse last night than the night prior; however he does not want to give up on the prazosin yet. Patient reports that he feels that his nightmares were less episodic. He reports that last night he saw deaths of people some who he had witnessed in reality and some were people who are still alive, but that he deeply cares about. Patient reported that he had not fully disclosed his trauma hx on admission because he did not think it was pertinent, but is realizing that as he no longer has substances in his system that his past bothers him more than he realizes. Patient reported that he does have a tendency to avoid certain streets or areas and smells that cause him to have flashbacks of these traumatic deaths. Patient also reports that he does find himself hypervigilant in certain situations. Patient reports that his mood today is "bleh" he reports that he does not believe today will be that great as he is not sure there will be much to do today. Patient also reports that he does continue question why he is alive and does have some thoughts on how to commit suicide if her where to go straight home. Patient does report that he feels that his withdrawal symptoms are improving and he currently has no plans inpatient to attempt. Patient does not endorse HI nor AVH today. Patient reports that he continues to be interested in rehab and hopes to get a bed soon. Patient reports that his appetite remains poor, but he is drinking the Ensure. Patient will attempt to take a Zofran before lunch today and try to eat solid foods.   Patient is seen interacting on the unit and appears to interact more as the day goes on. Patient has been noted to attend group's and interacts well with others.  Principal Problem: Major depressive disorder, recurrent severe without  psychotic features ([place]) Diagnosis: Principal Problem:   Major depressive disorder, recurrent severe without psychotic features ([place]) Active Problems:   Opiate dependence ([place])   Cocaine abuse ([place])  Total Time spent with patient: 20 minutes  Past Psychiatric History: See H&P  Past Medical History:  Past Medical History:  Diagnosis Date  . Chronic back pain   . Chronic hand pain   . DDD (degenerative disc disease), cervical   . De Quervain's tenosynovitis   . Drug-seeking behavior   . Narcotic dependence ([place])     Past Surgical History:  Procedure Laterality Date  . MASS EXCISION N/A 03/13/2018   Procedure: EXCISION LIPOMA SCALP;  Surgeon: Aviva Signs, MD;  Location: [facility];  Service: General;  Laterality: N/A;  pt knows to arrive at 6:15  . SPINAL FUSION     Family History:  Family History  Family history unknown: Yes   Family Psychiatric  History: See H&P Social History:  Social History   Substance and Sexual Activity  Alcohol Use No     Social History   Substance and Sexual Activity  Drug Use Yes   Comment: heroin and Fentanyl    Social History   Socioeconomic History  . Marital status: Legally Separated    Spouse name: Not on file  . Number of children: Not on file  . Years of education: Not on file  . Highest education level: Not on file  Occupational History  .  Not on file  Tobacco Use  . Smoking status: Current Every Day Smoker    Packs/day: 0.50    Years: 16.00    Pack years: 8.00    Types: Cigarettes  . Smokeless tobacco: Never Used  Vaping Use  . Vaping Use: Never used  Substance and Sexual Activity  . Alcohol use: No  . Drug use: Yes    Comment: heroin and Fentanyl  . Sexual activity: Yes    Birth control/protection: None  Other Topics Concern  . Not on file  Social History Narrative  . Not on file   Social Determinants of Health   Financial Resource Strain: Not on file  Food Insecurity: Not on file  Transportation Needs: Not  on file  Physical Activity: Not on file  Stress: Not on file  Social Connections: Not on file   Additional Social History:                         Sleep: Poor  Appetite:  Poor  Current Medications: Current Facility-Administered Medications  Medication Dose Route Frequency Provider Last Rate Last Admin  . acetaminophen (TYLENOL) tablet 650 mg  650 mg Oral Q6H PRN Sharma Covert, MD   650 mg at 07/02/20 2035  . alum & mag hydroxide-simeth (MAALOX/MYLANTA) 200-200-20 MG/5ML suspension 30 mL  30 mL Oral Q4H PRN Sharma Covert, MD   30 mL at 07/04/20 1012  . cloNIDine (CATAPRES) tablet 0.1 mg  0.1 mg Oral Daily Patrecia Pour, NP   0.1 mg at 07/07/20 0844  . dicyclomine (BENTYL) tablet 20 mg  20 mg Oral TID PRN Cristofano, Dorene Ar, MD   20 mg at 07/06/20 1253  . escitalopram (LEXAPRO) tablet 10 mg  10 mg Oral Daily Cristofano, Dorene Ar, MD   10 mg at 07/07/20 0844  . feeding supplement (ENSURE ENLIVE / ENSURE PLUS) liquid 237 mL  237 mL Oral BID BM Damita Dunnings B, MD   237 mL at 07/07/20 0902  . gabapentin (NEURONTIN) capsule 300 mg  300 mg Oral TID Dixie Dials, MD   300 mg at 07/07/20 0844  . ibuprofen (ADVIL) tablet 600 mg  600 mg Oral Q6H PRN Cristofano, Dorene Ar, MD   600 mg at 07/06/20 2149  . magnesium hydroxide (MILK OF MAGNESIA) suspension 30 mL  30 mL Oral Daily PRN Sharma Covert, MD      . nicotine (NICODERM CQ - dosed in mg/24 hours) patch 21 mg  21 mg Transdermal Daily Sharma Covert, MD   21 mg at 07/07/20 0843  . ondansetron (ZOFRAN) tablet 4 mg  4 mg Oral TID with meals Damita Dunnings B, MD   4 mg at 07/07/20 0844  . prazosin (MINIPRESS) capsule 1 mg  1 mg Oral QHS Damita Dunnings B, MD   1 mg at 07/06/20 2147  . QUEtiapine (SEROQUEL) tablet 100 mg  100 mg Oral QHS PRN Freida Busman, MD   100 mg at 07/06/20 2147    Lab Results:  Results for orders placed or performed during the hospital encounter of 06/29/20 (from the past 48 hour(s))   Comprehensive metabolic panel     Status: Abnormal   Collection Time: 07/06/20  6:00 PM  Result Value Ref Range   Sodium 140 135 - 145 mmol/L   Potassium 4.3 3.5 - 5.1 mmol/L   Chloride 106 98 - 111 mmol/L   CO2 22 22 - 32 mmol/L  Glucose, Bld 99 70 - 99 mg/dL    Comment: Glucose reference range applies only to samples taken after fasting for at least 8 hours.   BUN 20 6 - 20 mg/dL   Creatinine, Ser 0.86 0.61 - 1.24 mg/dL   Calcium 9.3 8.9 - 10.3 mg/dL   Total Protein 7.7 6.5 - 8.1 g/dL   Albumin 4.0 3.5 - 5.0 g/dL   AST 14 (L) 15 - 41 U/L   ALT 14 0 - 44 U/L   Alkaline Phosphatase 109 38 - 126 U/L   Total Bilirubin 0.3 0.3 - 1.2 mg/dL   GFR, Estimated >60 >60 mL/min    Comment: (NOTE) Calculated using the CKD-EPI Creatinine Equation (2021)    Anion gap 12 5 - 15    Comment: Performed at [facility], [place] [address] [place], [place] [address]  Amylase     Status: Abnormal   Collection Time: 07/06/20  6:11 PM  Result Value Ref Range   Amylase 157 (H) 28 - 100 U/L    Comment: Performed at [facility], [place] [address] [place], [place] [address]  Lipase, blood     Status: None   Collection Time: 07/06/20  6:11 PM  Result Value Ref Range   Lipase 49 11 - 51 U/L    Comment: Performed at [facility], [place] [address], [place], [place] [address]    Blood Alcohol level:  Lab Results  Component Value Date   ETH <10 06/28/2020   ETH <10 99991111    Metabolic Disorder Labs: No results found for: HGBA1C, MPG No results found for: PROLACTIN No results found for: CHOL, TRIG, HDL, CHOLHDL, VLDL, LDLCALC  Physical Findings: AIMS: Facial and Oral Movements Muscles of Facial Expression: None, normal Lips and Perioral Area: None, normal Jaw: None, normal Tongue: None, normal,Extremity Movements Upper (arms, wrists, hands, fingers): None, normal Lower (legs, knees, ankles, toes): None, normal, Trunk Movements Neck,  shoulders, hips: None, normal, Overall Severity Severity of abnormal movements (highest score from questions above): None, normal Incapacitation due to abnormal movements: None, normal Patient's awareness of abnormal movements (rate only patient's report): No Awareness, Dental Status Current problems with teeth and/or dentures?: No Does patient usually wear dentures?: No  CIWA:    COWS:  COWS Total Score: 1  Musculoskeletal: Strength & Muscle Tone: within normal limits Gait & Station: normal Patient leans: N/A  Psychiatric Specialty Exam: Physical Exam HENT:     Head: Normocephalic.  Pulmonary:     Effort: Pulmonary effort is normal.  Neurological:     Mental Status: He is alert.     Review of Systems  Constitutional: Positive for appetite change.  Cardiovascular: Negative for chest pain.  Gastrointestinal: Positive for abdominal pain.    Blood pressure 135/83, pulse (!) 125, temperature 98.4 F (36.9 C), temperature source Oral, resp. rate 16, height 5\' 3"  (1.6 m), weight 61.5 kg, SpO2 100 %.Body mass index is 24 kg/m.  General Appearance: Casual  Eye Contact:  Fair  Speech:  Clear and Coherent  Volume:  Decreased  Mood:  Dysphoric  Affect:  Depressed  Thought Process:  Coherent  Orientation:  NA  Thought Content:  Logical  Suicidal Thoughts:  Yes.  without intent/plan inpatient. Patient reports that he has 3 ideas if her where to go home, but he is not willing to disclose the ideas. Patient does continue to contract for safety and continues to endorse that he is focused on rehab.  Homicidal Thoughts:  No  Memory:  Recent;   Good  Judgement:  Other:  Improving  Insight:  Improving  Psychomotor Activity:  Psychomotor Retardation  Concentration:  Concentration: Fair  Recall:  NA  Fund of Knowledge:  NA  Language:  Good  Akathisia:  No  Handed:  Right  AIMS (if indicated):     Assets:  Communication Skills Desire for Improvement Housing Leisure Time Resilience   ADL's:  Intact  Cognition:  WNL  Sleep:  Number of Hours: 6.5     Treatment Plan Summary: Daily contact with patient to assess and evaluate symptoms and progress in treatment Mr. Mcglinchey is a 40 yo patient who is being treated for MDD, severe, w/o psychotic features, Cocaine abuse, and opiate dependence and is currently going through opiate withdrawal. Patient withdrawal symptoms appear to be decreasing but patient continues to have abdominal discomfort and diarrhea. Patient Lipase was WNL but his Amylase was elevated at 157. AST 14 and ALT 14 w/ ALP 109. Patient appears to be doing well on the unit but is often seen throwing his food away. Patient is drinking his Ensure. Patient is also continuing to endorsed depressed mood. Will increase Lexapro to address this depressed mood and PTSD. Will continue Prazosin at 1mg  as patient PO intake is poor and patient wants to try another night at the current dose as he felt there was some difference. It was discussed with patient that it is very important that he see a therapist OP. Patient endorsed that he will think about this.  EKG noted Qtc 432. MDD, recurrent, severe, w/o psychotic features Insomnia - Increase Lexapro To 20mg  - Gabapentin 300mg   TID - Continue Seroquel to 100mg  QHS - Continue Prazosin 1mg  QHS - Ensure BID - Consider consult to Hospitalist concerning abnormal Amylase and abd pain  Opiate withdrawal - Clonidine 0.1mg  daily - Bentyl 20mg  TID PRN  Cocaine abuse  Tobacco use disorder -Nicotine patch 21mg  daily  PRN -Tylenol 650mg  q6h, pain -Maalox 85ml q4h, indigestion -Atarax 25mg  TID, anxiety -Milk of Mag 91mL, constipation - Advil 600mg  q6h PGY-1 Freida Busman, MD 07/07/2020, 10:57 AM

## 2020-07-07 NOTE — Progress Notes (Signed)
Patient continues to report having a poor appetite today. He did consume one ensure but refused the second ensure at noon-time. He refused the noon dose of Zofran, stating that he's not nauseous. He reports mild withdrawal symptoms of sweats and chills. He appears irritable throughout the day and paces the hallway without any behavioral problems.   Orders reviewed. Vital signs reviewed. Verbal support provided. 15 minute checks performed for safety.     07/07/20 0900  Psych Admission Type (Psych Patients Only)  Admission Status Voluntary  Psychosocial Assessment  Patient Complaints Substance abuse  Eye Contact Brief  Facial Expression Anxious  Affect Anxious  Speech Logical/coherent  Interaction Assertive  Motor Activity Pacing  Appearance/Hygiene Unremarkable  Behavior Characteristics Appropriate to situation;Cooperative  Mood Anxious;Irritable  Thought Process  Coherency WDL  Content WDL  Delusions None reported or observed  Perception WDL  Hallucination None reported or observed  Judgment Poor  Confusion None  Danger to Self  Current suicidal ideation? Denies  Self-Injurious Behavior No self-injurious ideation or behavior indicators observed or expressed   Danger to Others  Danger to Others None reported or observed

## 2020-07-08 LAB — BASIC METABOLIC PANEL
Anion gap: 12 (ref 5–15)
BUN: 19 mg/dL (ref 6–20)
CO2: 24 mmol/L (ref 22–32)
Calcium: 9.2 mg/dL (ref 8.9–10.3)
Chloride: 102 mmol/L (ref 98–111)
Creatinine, Ser: 1.02 mg/dL (ref 0.61–1.24)
GFR, Estimated: >60 mL/min (ref >60)
Glucose, Bld: 141 mg/dL — ABNORMAL HIGH (ref 70–99)
Potassium: 3.9 mmol/L (ref 3.5–5.1)
Sodium: 138 mmol/L (ref 135–145)

## 2020-07-08 MED ORDER — DICYCLOMINE HCL 20 MG PO TABS
20.0000 mg | ORAL_TABLET | Freq: Three times a day (TID) | ORAL | Status: DC
  Administered 2020-07-08 – 2020-07-12 (×14): 20 mg via ORAL
  Filled 2020-07-08 (×23): qty 1

## 2020-07-08 NOTE — Progress Notes (Signed)
   07/07/20 2200  Psych Admission Type (Psych Patients Only)  Admission Status Voluntary  Psychosocial Assessment  Patient Complaints Anxiety;Depression  Eye Contact Brief  Facial Expression Anxious  Affect Anxious  Speech Logical/coherent  Interaction Assertive  Motor Activity Pacing  Appearance/Hygiene Unremarkable  Behavior Characteristics Cooperative;Anxious  Mood Irritable  Thought Process  Coherency WDL  Content WDL  Delusions None reported or observed  Perception WDL  Hallucination None reported or observed  Judgment Poor  Confusion None  Danger to Self  Current suicidal ideation? Denies  Danger to Others  Danger to Others None reported or observed   Pt denies SI, HI, AVH. Pt still endorsing chronic back pain and needing ibuprofen to manage. Pt denies any withdrawal symptoms. Pt states he has a bed at [facility]. Was waiting to hear from [facility] but is going to go with the bed already available. Pt seemed frustrated by the entire process. Pt still expressing issues with ability to eat solid food well.

## 2020-07-08 NOTE — [facility] Counselor (Signed)
CSW received a call from [facility] and learned that pt could not be accepted to their program due to being a registered sex offender.   Toney Reil, [place] Worker [employer]

## 2020-07-08 NOTE — Progress Notes (Signed)
   07/08/20 2051  Psych Admission Type (Psych Patients Only)  Admission Status Voluntary  Psychosocial Assessment  Patient Complaints Other (Comment) (digestive issues)  Eye Contact Brief  Facial Expression Anxious  Affect Anxious  Speech Logical/coherent  Interaction Assertive  Motor Activity Pacing  Appearance/Hygiene Unremarkable  Behavior Characteristics Cooperative;Anxious  Mood Pleasant  Thought Process  Coherency WDL  Content WDL  Delusions None reported or observed  Perception WDL  Hallucination None reported or observed  Judgment Poor  Confusion None  Danger to Self  Current suicidal ideation? Denies  Danger to Others  Danger to Others None reported or observed   Pt states that he is still experiencing issues with eating. "When I bring the food to my mouth, the smell makes me gag, even if I could get it to go down my throat." Pt states he is supposed to have diagnostic procedures tomorrow. "It's my pancreas. They are going to do an xray tomorrow." Pt denies SI, HI, AVH and withdrawal symptoms. Pt rates chronic back pain 8/10.

## 2020-07-08 NOTE — Progress Notes (Signed)
   07/08/20 0749  Vital Signs  Pulse Rate (!) 124  Pulse Rate Source Monitor  BP 137/89  BP Location Left Arm  BP Method Automatic  Patient Position (if appropriate) Standing  Oxygen Therapy  SpO2 100 %   D: Patient denies SI/HI/AVH. Patient rated both anxiety and depression 8/10. Patient out in open areas, social with peers and staff. Patient reported that he hadn't ate, but was able to get ice cream and ensure down. Patient complained of lower back pain. A:  Patient took scheduled medicine. Back pain was relieved by  600 mg of Advil Support and encouragement provided Routine safety checks conducted every 15 minutes. Patient  Informed to notify staff with any concerns.   R: Safety maintained.

## 2020-07-08 NOTE — Progress Notes (Addendum)
[facility] MD Progress Note  07/08/2020 11:46 AM Steve Henry  MRN:  [id number] Subjective:  Patient reports that he did sleep better but is still having nightmares, but he continues to have issues with PO intake. Patient reports that he is now vomiting his Ensure despite taking the Zofran. Patient reports that this is his first day where he did not wake up with thoughts of self harm and he hopes to be able to continue to have a more positive outlook today. Patient does not endorse HI nor AVH today. Patient reports that he was able to come up with 3 positive attributes about himself yesterday per his assignment given by MD. Patient reports that he was able to realize that he he finds himself strong, charismatic, and he likes his overall attitude.   Principal Problem: Major depressive disorder, recurrent severe without psychotic features ([place]) Diagnosis: Principal Problem:   Major depressive disorder, recurrent severe without psychotic features ([place]) Active Problems:   Opiate dependence ([place])   Cocaine abuse ([place])  Total Time spent with patient: 20 minutes  Past Psychiatric History: See H&P  Past Medical History:  Past Medical History:  Diagnosis Date  . Chronic back pain   . Chronic hand pain   . DDD (degenerative disc disease), cervical   . De Quervain's tenosynovitis   . Drug-seeking behavior   . Narcotic dependence ([place])     Past Surgical History:  Procedure Laterality Date  . MASS EXCISION N/A 03/13/2018   Procedure: EXCISION LIPOMA SCALP;  Surgeon: Aviva Signs, MD;  Location: [facility];  Service: General;  Laterality: N/A;  pt knows to arrive at 6:15  . SPINAL FUSION     Family History:  Family History  Family history unknown: Yes   Family Psychiatric  History: See H&P Social History:  Social History   Substance and Sexual Activity  Alcohol Use No     Social History   Substance and Sexual Activity  Drug Use Yes   Comment: heroin and Fentanyl    Social History    Socioeconomic History  . Marital status: Legally Separated    Spouse name: Not on file  . Number of children: Not on file  . Years of education: Not on file  . Highest education level: Not on file  Occupational History  . Not on file  Tobacco Use  . Smoking status: Current Every Day Smoker    Packs/day: 0.50    Years: 16.00    Pack years: 8.00    Types: Cigarettes  . Smokeless tobacco: Never Used  Vaping Use  . Vaping Use: Never used  Substance and Sexual Activity  . Alcohol use: No  . Drug use: Yes    Comment: heroin and Fentanyl  . Sexual activity: Yes    Birth control/protection: None  Other Topics Concern  . Not on file  Social History Narrative  . Not on file   Social Determinants of Health   Financial Resource Strain: Not on file  Food Insecurity: Not on file  Transportation Needs: Not on file  Physical Activity: Not on file  Stress: Not on file  Social Connections: Not on file   Additional Social History:                         Sleep: Improving  Appetite:  Poor  Current Medications: Current Facility-Administered Medications  Medication Dose Route Frequency Provider Last Rate Last Admin  . acetaminophen (TYLENOL) tablet 650 mg  650 mg Oral Q6H PRN Sharma Covert, MD   650 mg at 07/02/20 2035  . alum & mag hydroxide-simeth (MAALOX/MYLANTA) 200-200-20 MG/5ML suspension 30 mL  30 mL Oral Q4H PRN Sharma Covert, MD   30 mL at 07/04/20 1012  . cloNIDine (CATAPRES) tablet 0.1 mg  0.1 mg Oral Daily Patrecia Pour, NP   0.1 mg at 07/08/20 0753  . dicyclomine (BENTYL) tablet 20 mg  20 mg Oral TID PRN Cristofano, Dorene Ar, MD   20 mg at 07/07/20 1407  . escitalopram (LEXAPRO) tablet 20 mg  20 mg Oral Daily Damita Dunnings B, MD   20 mg at 07/08/20 0753  . feeding supplement (ENSURE ENLIVE / ENSURE PLUS) liquid 237 mL  237 mL Oral BID BM Damita Dunnings B, MD   237 mL at 07/07/20 0902  . gabapentin (NEURONTIN) capsule 300 mg  300 mg Oral TID  Dixie Dials, MD   300 mg at 07/08/20 0753  . ibuprofen (ADVIL) tablet 600 mg  600 mg Oral Q6H PRN Cristofano, Dorene Ar, MD   600 mg at 07/07/20 2203  . magnesium hydroxide (MILK OF MAGNESIA) suspension 30 mL  30 mL Oral Daily PRN Sharma Covert, MD      . nicotine (NICODERM CQ - dosed in mg/24 hours) patch 21 mg  21 mg Transdermal Daily Sharma Covert, MD   21 mg at 07/08/20 0753  . ondansetron (ZOFRAN) tablet 4 mg  4 mg Oral TID with meals Damita Dunnings B, MD   4 mg at 07/08/20 0753  . prazosin (MINIPRESS) capsule 1 mg  1 mg Oral QHS Damita Dunnings B, MD   1 mg at 07/07/20 2201  . QUEtiapine (SEROQUEL) tablet 100 mg  100 mg Oral QHS PRN Freida Busman, MD   100 mg at 07/07/20 2201    Lab Results:  Results for orders placed or performed during the hospital encounter of 06/29/20 (from the past 48 hour(s))  Comprehensive metabolic panel     Status: Abnormal   Collection Time: 07/06/20  6:00 PM  Result Value Ref Range   Sodium 140 135 - 145 mmol/L   Potassium 4.3 3.5 - 5.1 mmol/L   Chloride 106 98 - 111 mmol/L   CO2 22 22 - 32 mmol/L   Glucose, Bld 99 70 - 99 mg/dL    Comment: Glucose reference range applies only to samples taken after fasting for at least 8 hours.   BUN 20 6 - 20 mg/dL   Creatinine, Ser 0.86 0.61 - 1.24 mg/dL   Calcium 9.3 8.9 - 10.3 mg/dL   Total Protein 7.7 6.5 - 8.1 g/dL   Albumin 4.0 3.5 - 5.0 g/dL   AST 14 (L) 15 - 41 U/L   ALT 14 0 - 44 U/L   Alkaline Phosphatase 109 38 - 126 U/L   Total Bilirubin 0.3 0.3 - 1.2 mg/dL   GFR, Estimated >60 >60 mL/min    Comment: (NOTE) Calculated using the CKD-EPI Creatinine Equation (2021)    Anion gap 12 5 - 15    Comment: Performed at [facility], [place] [address], [place], [place] [address]  Amylase     Status: Abnormal   Collection Time: 07/06/20  6:11 PM  Result Value Ref Range   Amylase 157 (H) 28 - 100 U/L    Comment: Performed at [facility], [place] [address],  [place], [place] [address]  Lipase, blood  Status: None   Collection Time: 07/06/20  6:11 PM  Result Value Ref Range   Lipase 49 11 - 51 U/L    Comment: Performed at [facility], [place] [address], [place], [place] [address]    Blood Alcohol level:  Lab Results  Component Value Date   ETH <10 06/28/2020   ETH <10 [id number]    Metabolic Disorder Labs: No results found for: HGBA1C, MPG No results found for: PROLACTIN No results found for: CHOL, TRIG, HDL, CHOLHDL, VLDL, LDLCALC  Physical Findings: AIMS: Facial and Oral Movements Muscles of Facial Expression: None, normal Lips and Perioral Area: None, normal Jaw: None, normal Tongue: None, normal,Extremity Movements Upper (arms, wrists, hands, fingers): None, normal Lower (legs, knees, ankles, toes): None, normal, Trunk Movements Neck, shoulders, hips: None, normal, Overall Severity Severity of abnormal movements (highest score from questions above): None, normal Incapacitation due to abnormal movements: None, normal Patient's awareness of abnormal movements (rate only patient's report): No Awareness, Dental Status Current problems with teeth and/or dentures?: No Does patient usually wear dentures?: No  CIWA:    COWS:  COWS Total Score: 1  Musculoskeletal: Strength & Muscle Tone: within normal limits Gait & Station: normal Patient leans: N/A  Psychiatric Specialty Exam: Physical Exam Constitutional:      Appearance: Normal appearance.  HENT:     Head: Normocephalic and atraumatic.  Pulmonary:     Effort: Pulmonary effort is normal.  Neurological:     Mental Status: He is alert.     Review of Systems  Constitutional: Positive for appetite change.  HENT: Negative for sore throat.   Gastrointestinal: Positive for abdominal pain.    Blood pressure 137/89, pulse (!) 124, temperature 98.3 F (36.8 C), temperature source Oral, resp. rate 16, height 5\' 3"  (1.6 m), weight 61.5 kg, SpO2 100 %.Body mass  index is 24 kg/m.  General Appearance: Casual  Eye Contact:  Fair  Speech:  Clear and Coherent  Volume:  Decreased  Mood:  "Im' ok"  Affect:  Flat  Thought Process:  Coherent  Orientation:  NA  Thought Content:  Logical  Suicidal Thoughts:  No  Homicidal Thoughts:  No  Memory:  Recent;   Good  Judgement:  Other:  Improving  Insight:  improving  Psychomotor Activity:  Psychomotor Retardation  Concentration:  Concentration: Fair  Recall:  NA  Fund of Knowledge:  Good  Language:  Good  Akathisia:  No  Handed:  Right  AIMS (if indicated):     Assets:  Communication Skills Desire for Improvement Housing Resilience Social Support  ADL's:  Intact  Cognition:  WNL  Sleep:  Number of Hours: 6.5     Treatment Plan Summary: Daily contact with patient to assess and evaluate symptoms and progress in treatment Mr. Rankin is a 40 yo patient who is being treated for MDD, severe, w/o psychotic features, Cocaine abuse, and opiate dependence and is currently going through opiate withdrawal. Patient heart rate continues to be elevated concerning that patient may still be in withdrawal. Patient continues to have significant issues with appetite but is endorsing that he is feeling the need to eat for the first time. Consulted hospitalist who recommended that patient BUN and Cr be assessed and per results increase . Will continue to monitor patient on his current dosage as today is his first day not endorsing thoughts of SI.  MDD, recurrent, severe, w/o psychotic features Insomnia - Cont Lexapro To 20mg  - Gabapentin 300mg  TID - Continue Seroquel  to 100mg  QHS - Continue Prazosin 1mg  QHS - Ensure BID - EKG repeat - BMP   Opiate withdrawal - Clonidine 0.1mg  daily - Bentyl 20mg  TID PRN  Cocaine abuse  Tobacco use disorder -Nicotine patch 21mg  daily  PRN -Tylenol 650mg  q6h, pain -Maalox 3ml q4h, indigestion -Atarax 25mg  TID, anxiety -Milk of Mag 5mL, constipation - Advil  600mg  q6h  PGY-1 Freida Busman, MD 07/08/2020, 11:46 AM

## 2020-07-08 NOTE — Progress Notes (Addendum)
Phone consult by Dr. Candie Chroman regarding persistent vomiting, abdominal pain and poor PO intake.  Unlikely pancreatitis as Lipase is not elevated. Unclear etiology at this time but I suspect he is still withdrawing.   -Advised to check QTc with repeat EKG as he is on multiple QT prolonging agents -Check BMP to evaluate for AKI/electrolyte abnormalities -Can try phenergan for nausea if no improvement with the Zofran and continue bentyl -Can try protonix 40 mg QD for acid suppression -If no improvement or evidence of AKI, he may need to go to the ED for IV fluids  Please reach out if any further questions or concerns   Thank you, Marva Panda, DO

## 2020-07-09 LAB — LIPID PANEL
Cholesterol: 169 mg/dL (ref 0–200)
HDL: 52 mg/dL (ref >40)
LDL Cholesterol: 89 mg/dL (ref 0–99)
Total CHOL/HDL Ratio: 3.3 RATIO
Triglycerides: 140 mg/dL (ref <150)
VLDL: 28 mg/dL (ref 0–40)

## 2020-07-09 LAB — HEMOGLOBIN A1C
Hgb A1c MFr Bld: 5.6 % (ref 4.8–5.6)
Mean Plasma Glucose: 114.02 mg/dL

## 2020-07-09 LAB — TSH: TSH: 0.389 u[IU]/mL (ref 0.350–4.500)

## 2020-07-09 MED ORDER — PANTOPRAZOLE SODIUM 40 MG PO TBEC
40.0000 mg | DELAYED_RELEASE_TABLET | Freq: Two times a day (BID) | ORAL | Status: DC
  Administered 2020-07-09 – 2020-07-12 (×6): 40 mg via ORAL
  Filled 2020-07-09: qty 1
  Filled 2020-07-09 (×2): qty 42
  Filled 2020-07-09 (×5): qty 1
  Filled 2020-07-09: qty 42
  Filled 2020-07-09 (×2): qty 1
  Filled 2020-07-09: qty 42
  Filled 2020-07-09 (×2): qty 1

## 2020-07-09 MED ORDER — LORAZEPAM 1 MG PO TABS
1.0000 mg | ORAL_TABLET | ORAL | Status: AC | PRN
  Administered 2020-07-10: 1 mg via ORAL
  Filled 2020-07-09 (×2): qty 1

## 2020-07-09 MED ORDER — LORAZEPAM 1 MG PO TABS
2.0000 mg | ORAL_TABLET | Freq: Once | ORAL | Status: AC
  Administered 2020-07-09: 2 mg via ORAL

## 2020-07-09 MED ORDER — ZIPRASIDONE MESYLATE 20 MG IM SOLR: 20.0000 mg | Freq: Two times a day (BID) | INTRAMUSCULAR | Status: DC | PRN

## 2020-07-09 NOTE — B[facility] Group Notes (Signed)
[place] LCSW Group Therapy  07/09/2020 2:27 PM  Type of Therapy:  Being Grateful  Participation Level:  None  Participation Quality:  Intrusive, Inattentive and Resistant  Affect:  Defensive and Irritable  Cognitive:  Appropriate  Insight:  Limited  Engagement in Therapy:  Defensive  Modes of Intervention:  Activity, Discussion and Exploration  Summary of Progress/Problems: Rastus shared that he is grateful for patience.  Kalven then became angry with another group member and began to yell when the group member asked who he was angry at.  Foday then left the group but did return towards the end of group and remained there the rest of the time.  Jimi also accepted the handouts that were provided.  Hau did not share anything else during the group. (Please see more detailed note in Epic for further explanation of the event)  Darleen Crocker 07/09/2020, 2:27 PM

## 2020-07-09 NOTE — Progress Notes (Signed)
Patient attended the evening A.A. meeting and walked out of group several times. Patient was irritable with this author prior to the start of group.

## 2020-07-09 NOTE — Progress Notes (Signed)
   07/09/20 1200  Psych Admission Type (Psych Patients Only)  Admission Status Voluntary  Psychosocial Assessment  Patient Complaints Anxiety  Eye Contact Brief  Facial Expression Anxious  Affect Anxious  Speech Logical/coherent  Interaction Assertive  Motor Activity Pacing  Appearance/Hygiene Unremarkable  Behavior Characteristics Cooperative;Anxious  Mood Anxious  Thought Process  Coherency WDL  Content WDL  Delusions None reported or observed  Perception WDL  Hallucination None reported or observed  Judgment Poor  Confusion None  Danger to Self  Current suicidal ideation? Denies  Self-Injurious Behavior No self-injurious ideation or behavior indicators observed or expressed   Agreement Not to Harm Self Yes  Description of Agreement Verbally contracts for safety  Danger to Others  Danger to Others None reported or observed

## 2020-07-09 NOTE — BHH Counselor (Signed)
During group CSW witnessed Beauregard become angry with another patient.  CSW witnessed Denis, who was sitting to CSW's left, state that "There is on man on this unit that keeps F**king pissing me off.  CSW spoke to [place] and stated that is not what this group is for.  Another male patient stated to Zenon "Who are you mad at man".  This individual was Jvion roommate and was in the process of being discharged from the hospital.  Simona Huh the jumps out of his chair and yell "It's F**king you.  I am tired of this shit around here".  CSW stood up beside [place] and stated to P[place]leton "No! We are not going to do this in here today.  Delsin sit down now."  CSW repeated "Joni sit down now 3 more times.  Deitrich did not attempt too approach the other male any further.  Rondale turned around to go back to his seat but then decided to leave the room.  On his way out CSW reminded Kayron that this group was for him too and that he could come back once he cooled off.  Rondal left the room for approximately 10 minutes and came back with a sweat shirt on.  Once he returned to the room he remained in his seat and was quiet.  Brook did not participate in the remainder of group.  After group CSW asked Deiondre if he wanted to talk and share his feelings.  Damaree shook his head no and rolled his eyes up towards his eyebrows.  CSW informed Elvin that if he wanted to talk for his to ask for CSW or for a nurse or tech.  Muneer did not respond.  CSW went to complete an assessment with another patient.  While completing this assessment CSW heard [place] yell at a male patient who was sitting by the phones..  The male patient asked Hanz if he was ok and what was going on.  Karthik told this patient "Stay the [facility] out of my business.  You don't need to know shit about me".  CSW stopped the assessment and stood up to get a better look at what was occurring.  It was at this point that [place] walked away and went to his room.  At least 2 other  patients have remarked to this CSW since that time that they are concerned about Tinsley's behavior on the hall and they are worried that he may become angry with them.  CSW informed those patients that things would be handled appropriately by staff.  CSW informed Dr. Claris Gower and a physician resident about the situation so that other suggestions and protocols could be discussed and the proper treatment for Willis could be made.  CSW will continue to monitor.

## 2020-07-09 NOTE — Progress Notes (Signed)
Recreation Therapy Notes  Date: 1.28.22 Time: 0930 Location: [address] Hall Group Room  Group Topic: Stress Management  Goal Area(s) Addresses:  Patient will identify positive stress management techniques. Patient will identify benefits of using stress management post d/c.  Behavioral Response: Engaged  Intervention: Stress Management  Activity: Progressive Muscle Relaxation.  LRT read a script to guide patients through tensing each muscle group individually and then relaxing it.     Education:  Stress Management, Discharge Planning.   Education Outcome: Acknowledges Education  Clinical Observations/Feedback:  Pt attended and participated in group session.     Victorino Sparrow, LRT/CTRS         Victorino Sparrow A 07/09/2020 11:24 AM

## 2020-07-09 NOTE — Progress Notes (Signed)
[facility] MD Progress Note  07/09/2020 11:59 AM Steve Henry  MRN:  [id number] Subjective:  This AM patient reports that he "slept pretty well" but his mood is negative today. Patient reports that he feels off and this irritates him, but he does realize that his outlook is a bit more positive. MD spoke with patient about the possibility that patient is still adjusting to being without opiates in his system and that it is going to take time for him to adjust. Patient agreed that this was likely. Patient did note that some of his withdrawal symptoms were decreasing; however he continues to have olfactory aversion to food. Patient reports that when he does force himself to try to eat he vomits the food back up and his stomach continues to hurt. Patient contracts for safety and does not endorse HI, nor AVH today.  Principal Problem: Major depressive disorder, recurrent severe without psychotic features ([place]) Diagnosis: Principal Problem:   Major depressive disorder, recurrent severe without psychotic features ([place]) Active Problems:   Opiate dependence ([place])   Cocaine abuse ([place])  Total Time spent with patient: 15 minutes  Past Psychiatric History: See H&P  Past Medical History:  Past Medical History:  Diagnosis Date  . Chronic back pain   . Chronic hand pain   . DDD (degenerative disc disease), cervical   . De Quervain's tenosynovitis   . Drug-seeking behavior   . Narcotic dependence ([place])     Past Surgical History:  Procedure Laterality Date  . MASS EXCISION N/A 03/13/2018   Procedure: EXCISION LIPOMA SCALP;  Surgeon: Aviva Signs, MD;  Location: [facility];  Service: General;  Laterality: N/A;  pt knows to arrive at 6:15  . SPINAL FUSION     Family History:  Family History  Family history unknown: Yes   Family Psychiatric  History: See H&P Social History:  Social History   Substance and Sexual Activity  Alcohol Use No     Social History   Substance and Sexual Activity  Drug Use  Yes   Comment: heroin and Fentanyl    Social History   Socioeconomic History  . Marital status: Legally Separated    Spouse name: Not on file  . Number of children: Not on file  . Years of education: Not on file  . Highest education level: Not on file  Occupational History  . Not on file  Tobacco Use  . Smoking status: Current Every Day Smoker    Packs/day: 0.50    Years: 16.00    Pack years: 8.00    Types: Cigarettes  . Smokeless tobacco: Never Used  Vaping Use  . Vaping Use: Never used  Substance and Sexual Activity  . Alcohol use: No  . Drug use: Yes    Comment: heroin and Fentanyl  . Sexual activity: Yes    Birth control/protection: None  Other Topics Concern  . Not on file  Social History Narrative  . Not on file   Social Determinants of Health   Financial Resource Strain: Not on file  Food Insecurity: Not on file  Transportation Needs: Not on file  Physical Activity: Not on file  Stress: Not on file  Social Connections: Not on file   Additional Social History:                         Sleep: Fair  Appetite:  Poor  Current Medications: Current Facility-Administered Medications  Medication Dose Route Frequency Provider Last Rate  Last Admin  . acetaminophen (TYLENOL) tablet 650 mg  650 mg Oral Q6H PRN Sharma Covert, MD   650 mg at 07/02/20 2035  . alum & mag hydroxide-simeth (MAALOX/MYLANTA) 200-200-20 MG/5ML suspension 30 mL  30 mL Oral Q4H PRN Sharma Covert, MD   30 mL at 07/04/20 1012  . cloNIDine (CATAPRES) tablet 0.1 mg  0.1 mg Oral Daily Patrecia Pour, NP   0.1 mg at 07/09/20 0855  . dicyclomine (BENTYL) tablet 20 mg  20 mg Oral TID AC & HS Damita Dunnings B, MD   20 mg at 07/09/20 1148  . escitalopram (LEXAPRO) tablet 20 mg  20 mg Oral Daily Damita Dunnings B, MD   20 mg at 07/09/20 0855  . feeding supplement (ENSURE ENLIVE / ENSURE PLUS) liquid 237 mL  237 mL Oral BID BM Aliya Sol B, MD   237 mL at 07/08/20 1411  . gabapentin  (NEURONTIN) capsule 300 mg  300 mg Oral TID Dixie Dials, MD   300 mg at 07/09/20 1148  . ibuprofen (ADVIL) tablet 600 mg  600 mg Oral Q6H PRN Cristofano, Dorene Ar, MD   600 mg at 07/09/20 1149  . magnesium hydroxide (MILK OF MAGNESIA) suspension 30 mL  30 mL Oral Daily PRN Sharma Covert, MD      . nicotine (NICODERM CQ - dosed in mg/24 hours) patch 21 mg  21 mg Transdermal Daily Sharma Covert, MD   21 mg at 07/09/20 0853  . ondansetron (ZOFRAN) tablet 4 mg  4 mg Oral TID with meals Damita Dunnings B, MD   4 mg at 07/09/20 1148  . pantoprazole (PROTONIX) EC tablet 40 mg  40 mg Oral BID Damita Dunnings B, MD      . prazosin (MINIPRESS) capsule 1 mg  1 mg Oral QHS Damita Dunnings B, MD   1 mg at 07/08/20 2209  . QUEtiapine (SEROQUEL) tablet 100 mg  100 mg Oral QHS PRN Freida Busman, MD   100 mg at 07/08/20 2209    Lab Results:  Results for orders placed or performed during the hospital encounter of 06/29/20 (from the past 48 hour(s))  Basic metabolic panel     Status: Abnormal   Collection Time: 07/08/20  5:44 PM  Result Value Ref Range   Sodium 138 135 - 145 mmol/L   Potassium 3.9 3.5 - 5.1 mmol/L   Chloride 102 98 - 111 mmol/L   CO2 24 22 - 32 mmol/L   Glucose, Bld 141 (H) 70 - 99 mg/dL    Comment: Glucose reference range applies only to samples taken after fasting for at least 8 hours.   BUN 19 6 - 20 mg/dL   Creatinine, Ser 1.02 0.61 - 1.24 mg/dL   Calcium 9.2 8.9 - 10.3 mg/dL   GFR, Estimated >60 >60 mL/min    Comment: (NOTE) Calculated using the CKD-EPI Creatinine Equation (2021)    Anion gap 12 5 - 15    Comment: Performed at [facility], [place] [address] [place], [place] [address]    Blood Alcohol level:  Lab Results  Component Value Date   ETH <10 06/28/2020   ETH <10 63/06/6008    Metabolic Disorder Labs: No results found for: HGBA1C, MPG No results found for: PROLACTIN No results found for: CHOL, TRIG, HDL, CHOLHDL, VLDL,  LDLCALC  Physical Findings: AIMS: Facial and Oral Movements Muscles of Facial Expression: None, normal Lips and Perioral Area: None, normal Jaw: None,  normal Tongue: None, normal,Extremity Movements Upper (arms, wrists, hands, fingers): None, normal Lower (legs, knees, ankles, toes): None, normal, Trunk Movements Neck, shoulders, hips: None, normal, Overall Severity Severity of abnormal movements (highest score from questions above): None, normal Incapacitation due to abnormal movements: None, normal Patient's awareness of abnormal movements (rate only patient's report): No Awareness, Dental Status Current problems with teeth and/or dentures?: No Does patient usually wear dentures?: No  CIWA:    COWS:  COWS Total Score: 1  Musculoskeletal: Strength & Muscle Tone: within normal limits Gait & Station: normal Patient leans: N/A  Psychiatric Specialty Exam: Physical Exam HENT:     Head: Normocephalic and atraumatic.  Pulmonary:     Effort: Pulmonary effort is normal.  Neurological:     Mental Status: He is alert.     Review of Systems  Cardiovascular: Negative for chest pain.  Gastrointestinal: Positive for abdominal pain.  Neurological: Negative for headaches.    Blood pressure 120/87, pulse (!) 112, temperature 98.3 F (36.8 C), temperature source Oral, resp. rate 16, height 5\' 3"  (1.6 m), weight 61.5 kg, SpO2 100 %.Body mass index is 24 kg/m.  General Appearance: Casual  Eye Contact:  Good  Speech:  Clear and Coherent  Volume:  Increased  Mood:  Irritable  Affect:  Blunt  Thought Process:  Coherent  Orientation:  NA  Thought Content:  Logical  Suicidal Thoughts:  No  Homicidal Thoughts:  No  Memory:  Recent;   Fair  Judgement:  Impaired  Insight:  Shallow  Psychomotor Activity:  Normal  Concentration:  Concentration: Fair  Recall:  NA  Fund of Knowledge:  Good  Language:  Good  Akathisia:  No  Handed:  Right  AIMS (if indicated):     Assets:   Communication Skills Housing Leisure Time  ADL's:  Intact  Cognition:  WNL  Sleep:  Number of Hours: 5.25     Treatment Plan Summary: Daily contact with patient to assess and evaluate symptoms and progress in treatment Mr. Menter is a 40 yo patient who is being treated for MDD, severe, w/o psychotic features, Cocaine abuse, and opiate dependence and is currently going through opiate withdrawal. Patient's withdrawal appears to be prolonged. Consult to hospitalist yesterday suggested decreased concern for pancreatitis and patient Qtc was 450. Patient electrolytes were WNL and patient was negative for AKI. Patient; however continues to have an elevated HR and difficulty with PO intake. Patient continues to have abdominal pain as well. At the consultation of Tennis Must. Neysa Bonito will start patient on Protonix. Patient was initially resistant; however MD spoke with patient about the dangers of his poor PO intake and that there needs to be some improvement before he can be declared stable for discharge. Patient endorsed understanding. Patient was also instructed to take his Zofran as scheduled to help with his nausea. Patient endorsed that he did want to feel better and decided that he would give Protonix a try. Will continue to monitor patient PO intake. Will continue to monitor mood; however as expressed above patient appears to be going through a prolonged withdrawal. Will not change his Lexapro at this time as it does appear to make some difference and patient is more future oriented. Some of patient's irritability and dysphoric mood may be 2/2 to withdrawal.  MDD, recurrent, severe, w/o psychotic features Insomnia -ContLexapro To 20mg  - Gabapentin 300mg  TID -ContinueSeroquel to 100mg  QHS -ContinuePrazosin 1mg  QHS - Ensure BID   Opiate withdrawal - Clonidine 0.1mg  daily - Bentyl 20mg   TID PRN - Zofran 4mg  AC - Protonix 40mg  BID   Cocaine abuse -EKG was negative for ACS  Tobacco use  disorder -Nicotine patch 21mg  daily  PRN -Tylenol 650mg  q6h, pain -Maalox 26ml q4h, indigestion -Atarax 25mg  TID, anxiety -Milk of Mag 70mL, constipation - Advil 600mg  q6h   PGY-1 Freida Busman, MD 07/09/2020, 11:59 AM

## 2020-07-09 NOTE — [facility] Group Notes (Signed)
Adult Psychoeducational Group Note  Date:  07/09/2020 Time:  11:07 AM  Group Topic/Focus:  Goals Group:   The focus of this group is to help patients establish daily goals to achieve during treatment and discuss how the patient can incorporate goal setting into their daily lives to aide in recovery.  Participation Level:  Active  Participation Quality:  Appropriate and Attentive  Affect:  Appropriate  Cognitive:  Appropriate  Insight: Appropriate and Good  Engagement in Group:  Engaged  Modes of Intervention:  Discussion  Additional Comments:  Pt attended morning goals group with the RN.  Steve Henry 07/09/2020, 11:07 AM

## 2020-07-09 NOTE — Progress Notes (Signed)
[place] NOVEL CORONAVIRUS (COVID-19) DAILY CHECK-OFF SYMPTOMS - answer yes or no to each - every day NO YES  Have you had a fever in the past 24 hours?  . Fever (Temp > 37.80C / 100F) X   Have you had any of these symptoms in the past 24 hours? . New Cough .  Sore Throat  .  Shortness of Breath .  Difficulty Breathing .  Unexplained Body Aches   X   Have you had any one of these symptoms in the past 24 hours not related to allergies?   . Runny Nose .  Nasal Congestion .  Sneezing   X   If you have had runny nose, nasal congestion, sneezing in the past 24 hours, has it worsened?  X   EXPOSURES - check yes or no X   Have you traveled outside the state in the past 14 days?  X   Have you been in contact with someone with a confirmed diagnosis of COVID-19 or PUI in the past 14 days without wearing appropriate PPE?  X   Have you been living in the same home as a person with confirmed diagnosis of COVID-19 or a PUI (household contact)?    X   Have you been diagnosed with COVID-19?    X              What to do next: Answered NO to all: Answered YES to anything:   Proceed with unit schedule Follow the [facility] Inpatient Flowsheet.   

## 2020-07-09 NOTE — Progress Notes (Signed)
[facility] Group Notes:  (Nursing/MHT/Case Management/Adjunct)  Date:  07/08/20  Time: 2015  Type of Therapy:  wrap up group  Participation Level:  Active  Participation Quality:  Attentive and Sharing  Affect:  Irritable and Resistant  Cognitive:  Appropriate  Insight:  Lacking  Engagement in Group:  Resistant  Modes of Intervention:  Clarification, Education and Support  Summary of Progress/Problems: Positive thinking and positive change were discussed.   Winfield Rast S 07/09/2020, 4:15 AM

## 2020-07-09 NOTE — Tx Team (Addendum)
Interdisciplinary Treatment and Diagnostic Plan Update  07/09/2020 Time of Session: 9:00am Steve Henry MRN: [id number]  Principal Diagnosis: Major depressive disorder, recurrent severe without psychotic features ([place])  Secondary Diagnoses: Principal Problem:   Major depressive disorder, recurrent severe without psychotic features ([place]) Active Problems:   Opiate dependence ([place])   Cocaine abuse ([place])   Current Medications:  Current Facility-Administered Medications  Medication Dose Route Frequency Provider Last Rate Last Admin  . acetaminophen (TYLENOL) tablet 650 mg  650 mg Oral Q6H PRN Sharma Covert, MD   650 mg at 07/02/20 2035  . alum & mag hydroxide-simeth (MAALOX/MYLANTA) 200-200-20 MG/5ML suspension 30 mL  30 mL Oral Q4H PRN Sharma Covert, MD   30 mL at 07/04/20 1012  . cloNIDine (CATAPRES) tablet 0.1 mg  0.1 mg Oral Daily Patrecia Pour, NP   0.1 mg at 07/09/20 0855  . dicyclomine (BENTYL) tablet 20 mg  20 mg Oral TID AC & HS Damita Dunnings B, MD   20 mg at 07/09/20 0655  . escitalopram (LEXAPRO) tablet 20 mg  20 mg Oral Daily Damita Dunnings B, MD   20 mg at 07/09/20 0855  . feeding supplement (ENSURE ENLIVE / ENSURE PLUS) liquid 237 mL  237 mL Oral BID BM McQuilla, Jai B, MD   237 mL at 07/08/20 1411  . gabapentin (NEURONTIN) capsule 300 mg  300 mg Oral TID Dixie Dials, MD   300 mg at 07/09/20 0855  . ibuprofen (ADVIL) tablet 600 mg  600 mg Oral Q6H PRN Dixie Dials, MD   600 mg at 07/08/20 2208  . magnesium hydroxide (MILK OF MAGNESIA) suspension 30 mL  30 mL Oral Daily PRN Sharma Covert, MD      . nicotine (NICODERM CQ - dosed in mg/24 hours) patch 21 mg  21 mg Transdermal Daily Sharma Covert, MD   21 mg at 07/09/20 0853  . ondansetron (ZOFRAN) tablet 4 mg  4 mg Oral TID with meals Damita Dunnings B, MD   4 mg at 07/09/20 0855  . prazosin (MINIPRESS) capsule 1 mg  1 mg Oral QHS Damita Dunnings B, MD   1 mg at 07/08/20 2209  . QUEtiapine  (SEROQUEL) tablet 100 mg  100 mg Oral QHS PRN Damita Dunnings B, MD   100 mg at 07/08/20 2209   PTA Medications: Medications Prior to Admission  Medication Sig Dispense Refill Last Dose  . Buprenorphine HCl-Naloxone HCl 8-2 MG FILM Place 1 Film under the tongue in the morning and at bedtime. 11 days supply filled 12/15 (Patient not taking: Reported on 06/30/2020)   Not Taking at Unknown time    Patient Stressors: Financial difficulties Marital or family conflict Substance abuse Other: homeless  Patient Strengths: Curator fund of knowledge Physical Health  Treatment Modalities: Medication Management, Group therapy, Case management,  1 to 1 session with clinician, Psychoeducation, Recreational therapy.   Physician Treatment Plan for Primary Diagnosis: Major depressive disorder, recurrent severe without psychotic features ([place]) Long Term Goal(s): Improvement in symptoms so as ready for discharge Improvement in symptoms so as ready for discharge   Short Term Goals: Ability to identify changes in lifestyle to reduce recurrence of condition will improve Ability to verbalize feelings will improve Ability to disclose and discuss suicidal ideas Ability to demonstrate self-control will improve Ability to identify and develop effective coping behaviors will improve Ability to maintain clinical measurements within normal limits will improve Compliance with prescribed medications will improve Ability  to identify triggers associated with substance abuse/mental health issues will improve Ability to identify changes in lifestyle to reduce recurrence of condition will improve Ability to verbalize feelings will improve Ability to disclose and discuss suicidal ideas Ability to demonstrate self-control will improve Ability to identify and develop effective coping behaviors will improve Ability to maintain clinical measurements within normal limits will improve Compliance with  prescribed medications will improve Ability to identify triggers associated with substance abuse/mental health issues will improve  Medication Management: Evaluate patient's response, side effects, and tolerance of medication regimen.  Therapeutic Interventions: 1 to 1 sessions, Unit Group sessions and Medication administration.  Evaluation of Outcomes: Progressing  Physician Treatment Plan for Secondary Diagnosis: Principal Problem:   Major depressive disorder, recurrent severe without psychotic features ([place]) Active Problems:   Opiate dependence ([place])   Cocaine abuse ([place])  Long Term Goal(s): Improvement in symptoms so as ready for discharge Improvement in symptoms so as ready for discharge   Short Term Goals: Ability to identify changes in lifestyle to reduce recurrence of condition will improve Ability to verbalize feelings will improve Ability to disclose and discuss suicidal ideas Ability to demonstrate self-control will improve Ability to identify and develop effective coping behaviors will improve Ability to maintain clinical measurements within normal limits will improve Compliance with prescribed medications will improve Ability to identify triggers associated with substance abuse/mental health issues will improve Ability to identify changes in lifestyle to reduce recurrence of condition will improve Ability to verbalize feelings will improve Ability to disclose and discuss suicidal ideas Ability to demonstrate self-control will improve Ability to identify and develop effective coping behaviors will improve Ability to maintain clinical measurements within normal limits will improve Compliance with prescribed medications will improve Ability to identify triggers associated with substance abuse/mental health issues will improve     Medication Management: Evaluate patient's response, side effects, and tolerance of medication regimen.  Therapeutic Interventions: 1 to 1  sessions, Unit Group sessions and Medication administration.  Evaluation of Outcomes: Progressing   RN Treatment Plan for Primary Diagnosis: Major depressive disorder, recurrent severe without psychotic features ([place]) Long Term Goal(s): Knowledge of disease and therapeutic regimen to maintain health will improve  Short Term Goals: Ability to demonstrate self-control, Ability to verbalize feelings will improve and Ability to identify and develop effective coping behaviors will improve  Medication Management: RN will administer medications as ordered by provider, will assess and evaluate patient's response and provide education to patient for prescribed medication. RN will report any adverse and/or side effects to prescribing provider.  Therapeutic Interventions: 1 on 1 counseling sessions, Psychoeducation, Medication administration, Evaluate responses to treatment, Monitor vital signs and CBGs as ordered, Perform/monitor CIWA, COWS, AIMS and Fall Risk screenings as ordered, Perform wound care treatments as ordered.  Evaluation of Outcomes: Progressing   LCSW Treatment Plan for Primary Diagnosis: Major depressive disorder, recurrent severe without psychotic features ([place]) Long Term Goal(s): Safe transition to appropriate next level of care at discharge, Engage patient in therapeutic group addressing interpersonal concerns.  Short Term Goals: Engage patient in aftercare planning with referrals and resources, Facilitate acceptance of mental health diagnosis and concerns and Facilitate patient progression through stages of change regarding substance use diagnoses and concerns  Therapeutic Interventions: Assess for all discharge needs, 1 to 1 time with Social worker, Explore available resources and support systems, Assess for adequacy in community support network, Educate family and significant other(s) on suicide prevention, Complete Psychosocial Assessment, Interpersonal group  therapy.  Evaluation of Outcomes:  Progressing   Progress in Treatment: Attending groups: Yes. Participating in groups: Yes. Taking medication as prescribed: Yes. Toleration medication: Yes. Family/Significant other contact made: No, will contact:  patient declined consents Patient understands diagnosis: Yes. Discussing patient identified problems/goals with staff: Yes. Medical problems stabilized or resolved: Yes. Denies suicidal/homicidal ideation: Yes. Issues/concerns per patient self-inventory: No. Other: None  New problem(s) identified: No, Describe:  none  New Short Term/Long Term Goal(s):medication stabilization, elimination of SI thoughts, development of comprehensive mental wellness plan.  Patient Goals:  "get better  Discharge Plan or Barriers: Patient is being referred to residential substance use treatment for continued care at discharge.  Reason for Continuation of Hospitalization: Medical Issues Medication stabilization Withdrawal symptoms  Estimated Length of Stay: 3-5 days   Attendees: Patient: Steve Henry  07/09/2020   Physician: Lala Lund, MD 07/09/2020   Nursing:  07/09/2020  RN Care Manager: 07/09/2020  Social Worker: Verdis Frederickson, [place] 07/09/2020   Recreational Therapist:  07/09/2020   Other:  07/09/2020   Other:  07/09/2020   Other: 07/09/2020     Scribe for Treatment Team: Darleen Crocker, LCSWA 07/09/2020 9:10 AM

## 2020-07-09 NOTE — Progress Notes (Signed)
Chaplain provided support at pt request.    Steve Henry described feeling frustrated with another patient on hall.  He requested prayers with chaplain and conversation that would help him center.   Chaplain shared prayers with Steve Henry and focused conversation on his motivation, children, dedication to treatment.   He is grateful for spending the weekend at [facility] prior to d/c to longer-term rehab.  He requests chaplain consult Monday prior to d/c

## 2020-07-10 ENCOUNTER — Encounter (HOSPITAL_COMMUNITY): Payer: Self-pay | Admitting: Psychiatry

## 2020-07-10 ENCOUNTER — Inpatient Hospital Stay (HOSPITAL_COMMUNITY): Payer: Medicaid Other

## 2020-07-10 LAB — SARS CORONAVIRUS 2 (TAT 6-24 HRS): SARS Coronavirus 2: NEGATIVE

## 2020-07-10 MED ORDER — IOHEXOL 9 MG/ML PO SOLN
500.0000 mL | ORAL | Status: AC
  Administered 2020-07-10 (×2): 500 mL via ORAL
  Filled 2020-07-10 (×2): qty 500

## 2020-07-10 MED ORDER — IOHEXOL 300 MG/ML  SOLN
100.0000 mL | Freq: Once | INTRAMUSCULAR | Status: AC | PRN
  Administered 2020-07-10: 100 mL via INTRAVENOUS
  Filled 2020-07-10: qty 100

## 2020-07-10 NOTE — Progress Notes (Signed)
Writer was sitting at the NS and was watching the hall from the camera when they observed pt go into another male pt room. Writer immediately went to the male pt room and told pt to leave their room. Writer had a private discussion with pt and told them they cannot go into any pt room whether male or male. Writer explained the risks of going into another pt room and some consequences that could result. Pt replied "okay" and appeared to Armed forces operational officer. Througout the night writer has witness pt and the same male pt fraternizing in the dayroom and speaking sexual towards each other. Writer overheard pt say to male pt "I'm ready to go half on a baby", referring to making a baby.

## 2020-07-10 NOTE — Progress Notes (Signed)
The patient was irritable at the outset of the shift last evening. He expressed his dismay after he and his peers were told that Alcoholics Anonymous would be coming in at 2000. He stated that he did not have to attend group since he was not an alcoholic. The patient became upset when he heard what the alternatives were for not attending group. He felt that this Pryor Curia was ordering him to go to his room when in fact he was encouraged to attend the meeting. Patient eventually settled down but walked out of group on numerous occasions. After the dayroom was closed down for the evening, he and a few of his peers continued to talk loudly in the hallway despite being encouraged to go to sleep. He was overheard telling one of his peers that this Pryor Curia was a "cracker". He was also redirected for trying to talk to his male peer from across the hallway.

## 2020-07-10 NOTE — Progress Notes (Signed)
   07/10/20 0035  Psych Admission Type (Psych Patients Only)  Admission Status Voluntary  Psychosocial Assessment  Patient Complaints Anxiety  Eye Contact Brief  Facial Expression Anxious  Affect Anxious  Speech Logical/coherent  Interaction Assertive  Motor Activity Pacing  Appearance/Hygiene Unremarkable  Behavior Characteristics Cooperative  Mood Anxious  Thought Process  Coherency WDL  Content WDL  Delusions None reported or observed  Perception WDL  Hallucination None reported or observed  Judgment Poor  Confusion None  Danger to Self  Current suicidal ideation? Denies  Self-Injurious Behavior No self-injurious ideation or behavior indicators observed or expressed   Agreement Not to Harm Self Yes  Description of Agreement Verbally contracts for safety  Danger to Others  Danger to Others None reported or observed

## 2020-07-10 NOTE — Progress Notes (Signed)
   07/10/20 0940  Psych Admission Type (Psych Patients Only)  Admission Status Voluntary  Psychosocial Assessment  Patient Complaints Anxiety  Eye Contact Brief  Facial Expression Anxious  Affect Anxious  Speech Logical/coherent  Interaction Assertive  Motor Activity Pacing  Appearance/Hygiene Unremarkable  Behavior Characteristics Cooperative;Anxious  Mood Anxious;Depressed  Thought Process  Coherency WDL  Content WDL  Delusions None reported or observed  Perception WDL  Hallucination None reported or observed  Judgment WDL  Confusion None  Danger to Self  Current suicidal ideation? Denies  Self-Injurious Behavior No self-injurious ideation or behavior indicators observed or expressed   Agreement Not to Harm Self Yes  Description of Agreement Verbally contracts for safety  Danger to Others  Danger to Others None reported or observed

## 2020-07-10 NOTE — Progress Notes (Signed)
Pt complains of nausea/vomitiing and he reports that he has eaten very little food in the past two weeks.  Pt says that when he eats or drinks that he has terrible abdominal pain.  MD ordered CT scan of abdomen.  Pt denies SI/HI/AVH.    Pt currently at [facility], waiting for CT to take place.

## 2020-07-10 NOTE — Progress Notes (Signed)
Steve [facility] Progress Note  07/10/2020 11:47 AM BREXTON JASKOLSKI  MRN:  [id number] Subjective:   Mr. Danowski is a 40 yr old male who presents with SI in the setting of withdrawal from opiates. PPHx is significant for poly-substance abuse.  He reports that his nausea and vomiting continues to significantly impact his ability to eat. He reports being able to take liquids and has stayed hydrated. He reports that the medications have not been effective in reducing his nausea. He reports still having hunger but being unable to keep anything down. He reports that the inability to eat has made him more tense and short tempered and that this is not like him. Given his continued issues discussed sending him to [facility] for a CT, he agreed with this. Discussed the importance of increasing his liquid intake as he will be receiving contrast and want to ensure he can eliminate it. He reported understanding. He reports no SI, HI, or AVH. He reports that his sleep has bene ok.  Principal Problem: Major depressive disorder, recurrent severe without psychotic features ([place]) Diagnosis: Principal Problem:   Major depressive disorder, recurrent severe without psychotic features ([place]) Active Problems:   Opiate dependence ([place])   Cocaine abuse ([place])  Total Time spent with patient: 20 minutes  Past Psychiatric History: Poly-Substance abuse  Past Medical History:  Past Medical History:  Diagnosis Date  . Chronic back pain   . Chronic hand pain   . DDD (degenerative disc disease), cervical   . De Quervain's tenosynovitis   . Drug-seeking behavior   . Narcotic dependence ([place])     Past Surgical History:  Procedure Laterality Date  . MASS EXCISION N/A 03/13/2018   Procedure: EXCISION LIPOMA SCALP;  Surgeon: Aviva Signs, Henry;  Location: [facility];  Service: General;  Laterality: N/A;  pt knows to arrive at 6:15  . SPINAL FUSION     Family History:  Family History  Family history unknown: Yes   Family  Psychiatric  History: Unknown Social History:  Social History   Substance and Sexual Activity  Alcohol Use No     Social History   Substance and Sexual Activity  Drug Use Yes   Comment: heroin and Fentanyl    Social History   Socioeconomic History  . Marital status: Legally Separated    Spouse name: Not on file  . Number of children: Not on file  . Years of education: Not on file  . Highest education level: Not on file  Occupational History  . Not on file  Tobacco Use  . Smoking status: Current Every Day Smoker    Packs/day: 0.50    Years: 16.00    Pack years: 8.00    Types: Cigarettes  . Smokeless tobacco: Never Used  Vaping Use  . Vaping Use: Never used  Substance and Sexual Activity  . Alcohol use: No  . Drug use: Yes    Comment: heroin and Fentanyl  . Sexual activity: Yes    Birth control/protection: None  Other Topics Concern  . Not on file  Social History Narrative  . Not on file   Social Determinants of Health   Financial Resource Strain: Not on file  Food Insecurity: Not on file  Transportation Needs: Not on file  Physical Activity: Not on file  Stress: Not on file  Social Connections: Not on file   Additional Social History:  Sleep: Fair  Appetite:  Hungry but unable to eat anything without feeling nauseous  Current Medications: Current Facility-Administered Medications  Medication Dose Route Frequency Provider Last Rate Last Admin  . acetaminophen (TYLENOL) tablet 650 mg  650 mg Oral Q6H PRN Sharma Covert, Henry   650 mg at 07/02/20 2035  . alum & mag hydroxide-simeth (MAALOX/MYLANTA) 200-200-20 MG/5ML suspension 30 mL  30 mL Oral Q4H PRN Sharma Covert, Henry   30 mL at 07/04/20 1012  . cloNIDine (CATAPRES) tablet 0.1 mg  0.1 mg Oral Daily Patrecia Pour, NP   0.1 mg at 07/10/20 0943  . dicyclomine (BENTYL) tablet 20 mg  20 mg Oral TID AC & HS Damita Dunnings B, Henry   20 mg at 07/10/20 0943  . escitalopram  (LEXAPRO) tablet 20 mg  20 mg Oral Daily Damita Dunnings B, Henry   20 mg at 07/10/20 0943  . feeding supplement (ENSURE ENLIVE / ENSURE PLUS) liquid 237 mL  237 mL Oral BID BM McQuilla, Jai B, Henry   237 mL at 07/08/20 1411  . gabapentin (NEURONTIN) capsule 300 mg  300 mg Oral TID Dixie Dials, Henry   300 mg at 07/10/20 0943  . ibuprofen (ADVIL) tablet 600 mg  600 mg Oral Q6H PRN Cristofano, Dorene Ar, Henry   600 mg at 07/09/20 1149  . iohexol (OMNIPAQUE) 9 MG/ML oral solution 500 mL  500 mL Oral Q1H McQuilla, Jai B, Henry      . ziprasidone (GEODON) injection 20 mg  20 mg Intramuscular Q12H PRN Freida Busman, Henry       And  . LORazepam (ATIVAN) tablet 1 mg  1 mg Oral PRN Damita Dunnings B, Henry      . magnesium hydroxide (MILK OF MAGNESIA) suspension 30 mL  30 mL Oral Daily PRN Sharma Covert, Henry      . nicotine (NICODERM CQ - dosed in mg/24 hours) patch 21 mg  21 mg Transdermal Daily Sharma Covert, Henry   21 mg at 07/10/20 0942  . ondansetron (ZOFRAN) tablet 4 mg  4 mg Oral TID with meals Damita Dunnings B, Henry   4 mg at 07/10/20 0944  . pantoprazole (PROTONIX) EC tablet 40 mg  40 mg Oral BID Damita Dunnings B, Henry   40 mg at 07/10/20 0943  . prazosin (MINIPRESS) capsule 1 mg  1 mg Oral QHS Damita Dunnings B, Henry   1 mg at 07/09/20 2212  . QUEtiapine (SEROQUEL) tablet 100 mg  100 mg Oral QHS PRN Freida Busman, Henry   100 mg at 07/09/20 2226    Lab Results:  Results for orders placed or performed during the hospital encounter of 06/29/20 (from the past 48 hour(s))  Basic metabolic panel     Status: Abnormal   Collection Time: 07/08/20  5:44 PM  Result Value Ref Range   Sodium 138 135 - 145 mmol/L   Potassium 3.9 3.5 - 5.1 mmol/L   Chloride 102 98 - 111 mmol/L   CO2 24 22 - 32 mmol/L   Glucose, Bld 141 (H) 70 - 99 mg/dL    Comment: Glucose reference range applies only to samples taken after fasting for at least 8 hours.   BUN 19 6 - 20 mg/dL   Creatinine, Ser 1.02 0.61 - 1.24 mg/dL   Calcium 9.2 8.9 -  10.3 mg/dL   GFR, Estimated >60 >60 mL/min    Comment: (NOTE) Calculated using the CKD-EPI Creatinine Equation (2021)  Anion gap 12 5 - 15    Comment: Performed at [facility], [place] [address], [place], [place] [address]  SARS CORONAVIRUS 2 (TAT 6-24 HRS) Nasopharyngeal Nasopharyngeal Swab     Status: None   Collection Time: 07/09/20  3:55 PM   Specimen: Nasopharyngeal Swab  Result Value Ref Range   SARS Coronavirus 2 NEGATIVE NEGATIVE    Comment: (NOTE) SARS-CoV-2 target nucleic acids are NOT DETECTED.  The SARS-CoV-2 RNA is generally detectable in upper and lower respiratory specimens during the acute phase of infection. Negative results do not preclude SARS-CoV-2 infection, do not rule out co-infections with other pathogens, and should not be used as the sole basis for treatment or other patient management decisions. Negative results must be combined with clinical observations, patient history, and epidemiological information. The expected result is Negative.  Fact Sheet for Patients: SugarRoll.be  Fact Sheet for Healthcare Providers: https://www.woods-mathews.com/  This test is not yet approved or cleared by the [place] FDA and  has been authorized for detection and/or diagnosis of SARS-CoV-2 by FDA under an Emergency Use Authorization (EUA). This EUA will remain  in effect (meaning this test can be used) for the duration of the COVID-19 declaration under Se ction 564(b)(1) of the Act, 21 U.S.C. section 360bbb-3(b)(1), unless the authorization is terminated or revoked sooner.  Performed at [facility], [place] [address] [place], [place] [address]   Hemoglobin A1c     Status: None   Collection Time: 07/09/20  6:08 PM  Result Value Ref Range   Hgb A1c MFr Bld 5.6 4.8 - 5.6 %    Comment: (NOTE) Pre diabetes:          5.7%-6.4%  Diabetes:              >6.4%  Glycemic control for   <7.0% adults  with diabetes    Mean Plasma Glucose 114.02 mg/dL    Comment: Performed at [place] [address] [place],  [address]  Lipid panel     Status: None   Collection Time: 07/09/20  6:08 PM  Result Value Ref Range   Cholesterol 169 0 - 200 mg/dL   Triglycerides 140 <150 mg/dL   HDL 52 >40 mg/dL   Total CHOL/HDL Ratio 3.3 RATIO   VLDL 28 0 - 40 mg/dL   LDL Cholesterol 89 0 - 99 mg/dL    Comment:        Total Cholesterol/HDL:CHD Risk Coronary Heart Disease Risk Table                     Men   Women  1/2 Average Risk   3.4   3.3  Average Risk       5.0   4.4  2 X Average Risk   9.6   7.1  3 X Average Risk  23.4   11.0        Use the calculated Patient Ratio above and the CHD Risk Table to determine the patient's CHD Risk.        ATP III CLASSIFICATION (LDL):  <100     mg/dL   Optimal  100-129  mg/dL   Near or Above                    Optimal  130-159  mg/dL   Borderline  160-189  mg/dL   High  >190     mg/dL   Very High Performed at  [facility], [place] [address] [place], [place] [address]   TSH     Status: None   Collection Time: 07/09/20  6:08 PM  Result Value Ref Range   TSH 0.389 0.350 - 4.500 uIU/mL    Comment: Performed by a 3rd Generation assay with a functional sensitivity of <=0.01 uIU/mL. Performed at [facility], [place] [address], [place], [place] [address]     Blood Alcohol level:  Lab Results  Component Value Date   ETH <10 06/28/2020   ETH <10 63/87/5643    Metabolic Disorder Labs: Lab Results  Component Value Date   HGBA1C 5.6 07/09/2020   MPG 114.02 07/09/2020   No results found for: PROLACTIN Lab Results  Component Value Date   CHOL 169 07/09/2020   TRIG 140 07/09/2020   HDL 52 07/09/2020   CHOLHDL 3.3 07/09/2020   VLDL 28 07/09/2020   LDLCALC 89 07/09/2020    Physical Findings: AIMS: Facial and Oral Movements Muscles of Facial Expression: None, normal Lips and Perioral Area: None,  normal Jaw: None, normal Tongue: None, normal,Extremity Movements Upper (arms, wrists, hands, fingers): None, normal Lower (legs, knees, ankles, toes): None, normal, Trunk Movements Neck, shoulders, hips: None, normal, Overall Severity Severity of abnormal movements (highest score from questions above): None, normal Incapacitation due to abnormal movements: None, normal Patient's awareness of abnormal movements (rate only patient's report): No Awareness, Dental Status Current problems with teeth and/or dentures?: No Does patient usually wear dentures?: No  CIWA:    COWS:  COWS Total Score: 1  Musculoskeletal: Strength & Muscle Tone: within normal limits Gait & Station: normal Patient leans: N/A  Psychiatric Specialty Exam: Physical Exam Vitals and nursing note reviewed.  Constitutional:      Appearance: Normal appearance. He is normal weight.  HENT:     Head: Normocephalic and atraumatic.  Cardiovascular:     Rate and Rhythm: Tachycardia present.  Pulmonary:     Effort: Pulmonary effort is normal.  Musculoskeletal:        General: Normal range of motion.  Neurological:     General: No focal deficit present.     Mental Status: He is alert.     Review of Systems  Constitutional: Negative for fatigue and fever.  Cardiovascular: Negative for chest pain and palpitations.  Gastrointestinal: Positive for diarrhea, nausea and vomiting. Negative for constipation.  Neurological: Negative for dizziness, weakness, light-headedness and headaches.    Blood pressure 114/75, pulse (!) 125, temperature 98.5 F (36.9 C), temperature source Oral, resp. rate 16, height 5\' 3"  (1.6 m), weight 61.5 kg, SpO2 100 %.Body mass index is 24 kg/m.  General Appearance: Casual  Eye Contact:  Fair  Speech:  Clear and Coherent and Normal Rate  Volume:  Normal  Mood:  Anxious  Affect:  Anxious  Thought Process:  Coherent  Orientation:  Full (Time, Place, and Person)  Thought Content:  Logical   Suicidal Thoughts:  No  Homicidal Thoughts:  No  Memory:  Immediate;   Fair Recent;   Fair  Judgement:  Intact  Insight:  Present  Psychomotor Activity:  Normal  Concentration:  Concentration: Fair and Attention Span: Fair  Recall:  Good  Fund of Knowledge:  Good  Language:  Good  Akathisia:  No  Handed:  Right  AIMS (if indicated):     Assets:  Desire for Improvement Resilience  ADL's:  Intact  Cognition:  WNL  Sleep:  Number of Hours: 5  Treatment Plan Summary: Daily contact with patient to assess and evaluate symptoms and progress in treatment   Mr. Majano is a 40 yr old male who presents with SI in the setting of withdrawal from opiates. PPHx is significant for poly-substance abuse.  He continues to have significant nausea with any solid oral intake only able to take liquids. He had an Amylase level of 157 on 1/25, given his continued symptoms suspect Acute Pancreatitis. Will obtain CT Abd/Pelvis w/ Contrast for further workup. Will not make any changes to medications at this time. Will continue to monitor.   MDD, recurrent, severe, w/o psychotic features  Insomnia: -ContinueLexapro To 20mg  -Continue Gabapentin 300mg  TID -ContinueSeroquel to 100mg  QHS -ContinuePrazosin 1mg  QHS   Possible Acute Pancreatitis: Will obtain CT Abdmen/Pelvis w Contrast   Opiate withdrawal: -Continue Clonidine 0.1mg  daily -Continue Bentyl 20mg  TID PRN -Continue Zofran 4mg  AC -Continue Protonix 40mg  BID   Cocaine abuse: -EKG was negative for ACS   Tobacco use disorder: -Continue Nicotine patch 21mg  daily   -Continue Ensure Shakes BID -Continue PRN's: Tylenol, Maalox, Advil, Milk of Magnesia, Trazodone    Briant Cedar, Henry 07/10/2020, 11:47 AM

## 2020-07-10 NOTE — Progress Notes (Addendum)
D.  Pt observed by staff going into male Pt's room, staff immediately intervened and found Pt standing inside doorway of Pt room.    A.  Pt directed back to room, MHT spoke with both Pts and explained that this is not allowed on the unit and why.  MHT explained that if Pts are not on camera anything can be said and there will be no way to prove the Pt is innocent.  Both Pts verbalized understanding however, same MHT (see her note) overheard Pts in dayroom speaking of how they want to meet up for intercourse.  Camera at nurses station put on hall so Network engineer as well as MHT assigned to hall can monitor hall.  When either of these staff members are away, this staff member will then be monitoring hall.    R.  Pts verbalized understanding.  Will closely monitor.

## 2020-07-10 NOTE — Progress Notes (Deleted)
Writer was sitting at the NS and was watching the hall from the camera when they observed pt go into another male pt room. Writer immediately went to the male pt room and told pt to leave their room. Writer had a private discussion with pt and told them they cannot go into any pt room whether male or male. Writer explained the risks of going into another pt room and some consequences that could result. Pt replied "okay" and appeared to understand writer. Througout the night writer has witness pt and the same male pt fraternizing in the dayroom and speaking sexual towards each other. Writer overheard pt say to male pt "I'm ready to go half on a baby", referring to making a baby.   

## 2020-07-10 NOTE — Progress Notes (Signed)
[facility] Group Notes:  (Nursing/MHT/Case Management/Adjunct)  Date:  07/10/2020  Time:  2000 Type of Therapy:  wrap up group  Participation Level:  Active  Participation Quality:  Appropriate, Attentive, Sharing and Supportive  Affect:  Appropriate  Cognitive:  Appropriate  Insight:  Improving  Engagement in Group:  Engaged  Modes of Intervention:  Clarification, Education and Support  Summary of Progress/Problems: Positive thinking and self-care were discussed.   Shellia Cleverly 07/10/2020, 10:25 PM

## 2020-07-10 NOTE — Progress Notes (Signed)
   07/10/20 2221  COVID-19 Daily Checkoff  Have you had a fever (temp > 37.80C/100F)  in the past 24 hours?  No  If you have had runny nose, nasal congestion, sneezing in the past 24 hours, has it worsened? No  COVID-19 EXPOSURE  Have you traveled outside the state in the past 14 days? No  Have you been in contact with someone with a confirmed diagnosis of COVID-19 or PUI in the past 14 days without wearing appropriate PPE? No  Have you been living in the same home as a person with confirmed diagnosis of COVID-19 or a PUI (household contact)? No  Have you been diagnosed with COVID-19? No

## 2020-07-11 MED ORDER — ESCITALOPRAM OXALATE 20 MG PO TABS: 20.0000 mg | ORAL_TABLET | Freq: Every day | ORAL | 0 refills | Status: AC

## 2020-07-11 MED ORDER — GABAPENTIN 300 MG PO CAPS: 300.0000 mg | ORAL_CAPSULE | Freq: Three times a day (TID) | ORAL | 0 refills | Status: AC

## 2020-07-11 MED ORDER — QUETIAPINE FUMARATE 100 MG PO TABS
100.0000 mg | ORAL_TABLET | Freq: Every day | ORAL | Status: DC
  Administered 2020-07-11: 100 mg via ORAL
  Filled 2020-07-11: qty 1
  Filled 2020-07-11: qty 21
  Filled 2020-07-11: qty 1
  Filled 2020-07-11: qty 21
  Filled 2020-07-11: qty 1

## 2020-07-11 MED ORDER — LORAZEPAM 1 MG PO TABS
1.0000 mg | ORAL_TABLET | Freq: Once | ORAL | Status: AC | PRN
  Administered 2020-07-11: 1 mg via ORAL
  Filled 2020-07-11: qty 1

## 2020-07-11 MED ORDER — HYDROXYZINE HCL 50 MG PO TABS: 50.0000 mg | ORAL_TABLET | Freq: Once | ORAL | Status: DC | PRN

## 2020-07-11 MED ORDER — PANTOPRAZOLE SODIUM 40 MG PO TBEC: 40.0000 mg | DELAYED_RELEASE_TABLET | Freq: Two times a day (BID) | ORAL | 0 refills | Status: AC

## 2020-07-11 MED ORDER — PRAZOSIN HCL 1 MG PO CAPS: 1.0000 mg | ORAL_CAPSULE | Freq: Every day | ORAL | 0 refills | Status: AC

## 2020-07-11 MED ORDER — QUETIAPINE FUMARATE 100 MG PO TABS: 100.0000 mg | ORAL_TABLET | Freq: Every day | ORAL | 0 refills | Status: AC
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
## 2020-07-11 NOTE — Progress Notes (Signed)
   07/11/20 2318  COVID-19 Daily Checkoff  Have you had a fever (temp > 37.80C/100F)  in the past 24 hours?  No  If you have had runny nose, nasal congestion, sneezing in the past 24 hours, has it worsened? No  COVID-19 EXPOSURE  Have you traveled outside the state in the past 14 days? No  Have you been in contact with someone with a confirmed diagnosis of COVID-19 or PUI in the past 14 days without wearing appropriate PPE? No  Have you been living in the same home as a person with confirmed diagnosis of COVID-19 or a PUI (household contact)? No  Have you been diagnosed with COVID-19? No

## 2020-07-11 NOTE — BHH Suicide Risk Assessment (Signed)
[facility] Discharge Suicide Risk Assessment   Principal Problem: Major depressive disorder, recurrent severe without psychotic features ([place]) Discharge Diagnoses: Principal Problem:   Major depressive disorder, recurrent severe without psychotic features ([place]) Active Problems:   Opiate dependence ([place])   Cocaine abuse ([place])   Total Time spent with patient: 20 minutes  Musculoskeletal: Strength & Muscle Tone: within normal limits Gait & Station: normal Patient leans: N/A  Psychiatric Specialty Exam: Review of Systems  All other systems reviewed and are negative.   Blood pressure (!) 134/92, pulse (!) 124, temperature 98.3 F (36.8 C), temperature source Oral, resp. rate 16, height 5\' 3"  (1.6 m), weight 61.5 kg, SpO2 100 %.Body mass index is 24 kg/m.  General Appearance: Fairly Groomed  Engineer, water::  Fair  Speech:  Normal [id number]  Volume:  Normal  Mood:  Anxious  Affect:  Congruent  Thought Process:  Coherent and Descriptions of Associations: Circumstantial  Orientation:  Full (Time, Place, and Person)  Thought Content:  Rumination  Suicidal Thoughts:  No  Homicidal Thoughts:  No  Memory:  Immediate;   Fair Recent;   Fair Remote;   Fair  Judgement:  Intact  Insight:  Fair  Psychomotor Activity:  Increased  Concentration:  Fair  Recall:  [employer] of Knowledge:Fair  Language: Fair  Akathisia:  Negative  Handed:  Right  AIMS (if indicated):     Assets:  Desire for Improvement Resilience  Sleep:  Number of Hours: 4.75  Cognition: WNL  ADL's:  Intact   Mental Status Per Nursing Assessment::   On Admission:  Suicidal ideation indicated by patient  Demographic Factors:  Male, Low socioeconomic status, Living alone and Unemployed  Loss Factors: Financial problems/change in socioeconomic status  Historical Factors: Impulsivity  Risk Reduction Factors:   NA  Continued Clinical Symptoms:  Depression:   Comorbid alcohol  abuse/dependence Impulsivity Alcohol/Substance Abuse/Dependencies  Cognitive Features That Contribute To Risk:  None    Suicide Risk:  Minimal: No identifiable suicidal ideation.  Patients presenting with no risk factors but with morbid ruminations; may be classified as minimal risk based on the severity of the depressive symptoms   Follow-up [facility] Follow up.   Specialty: Addiction Medicine Why: Referral made, but patient was declined. Contact information: [place] [address] [facility]. Go on 07/12/2020.   Why: Admission to rehab is expected on 1/31 at 10:00am. Contact information: [address] [place] [address] [phone]               Plan Of Care/Follow-up recommendations:  Activity:  ad lib  Sharma Covert, MD 07/11/2020, 3:23 PM
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
## 2020-07-11 NOTE — Progress Notes (Signed)
Patient presents with a flat affect and depressed mood. He reported feeling depressed this morning but did not provide further details. He denies SI/HI. He verbalized readiness for discharge tomorrow for residential treatment.   Orders reviewed. Vital signs reviewed. Verbal support provided. 15 minute checks performed for safety. EKG performed.   Patient compliant with taking medications and denies any medication side effect.     07/11/20 1000  Psych Admission Type (Psych Patients Only)  Admission Status Voluntary  Psychosocial Assessment  Patient Complaints Depression  Eye Contact Brief  Facial Expression Flat  Affect Depressed;Sad  Speech Logical/coherent  Interaction Minimal;Guarded  Motor Activity Fidgety  Appearance/Hygiene Unremarkable  Behavior Characteristics Cooperative  Mood Depressed;Irritable  Thought Process  Coherency WDL  Content WDL  Delusions None reported or observed  Perception WDL  Hallucination None reported or observed  Judgment Poor  Confusion None  Danger to Self  Current suicidal ideation? Denies  Self-Injurious Behavior No self-injurious ideation or behavior indicators observed or expressed   Danger to Others  Danger to Others None reported or observed

## 2020-07-11 NOTE — Progress Notes (Signed)
[facility] Group Notes:  (Nursing/MHT/Case Management/Adjunct)  Date:  07/11/2020  Time: 2000 Type of Therapy:  wrap up group  Participation Level:  Active  Participation Quality:  Appropriate, Attentive, Sharing and Supportive  Affect:  Appropriate  Cognitive:  Appropriate  Insight:  Improving  Engagement in Group:  Engaged  Modes of Intervention:  Clarification, Education and Support  Summary of Progress/Problems: Positive thinking and positive change were discussed.   Steve Henry 07/11/2020, 9:39 PM

## 2020-07-11 NOTE — [facility] Group Notes (Signed)
[facility] LCSW Group Therapy Note  07/11/2020  10:15-11:15am  Type of Therapy and Topic:  Group Therapy:  Adding Supports Including Yourself  Participation Level:  Active   Description of Group:   Patients in this group were introduced to the concept that additional supports including self-support are an essential part of recovery.  Patients listed what supports they believe they need to add to their lives to achieve their goals at discharge, and they listed such things as therapist, family, doctor, support groups, 12-step groups and service animals.   A song entitled "My Own Hero" was played and a group discussion ensued in which patients stated they could relate to the song and it inspired them to realize they have to be willing to help themselves in order to succeed, because other people cannot achieve sobriety or stability for them.  Additional song ("I Am Enough") was played to encourage patients toward self-advocacy and self-support as part of their recovery.  They discussed their reactions to these songs' messages, which were positive and hopeful.  Therapeutic Goals: 1)  demonstrate the importance of being a key part of one's own support system 2)  discuss various available supports 3)  encourage patient to use music as part of their self-support and focus on goals 4)  elicit ideas from patients about supports that need to be added   Summary of Patient Progress:  The patient expressed himself appropriately and frequently throughout group.  His comments were on topic, insightful, and other patients expressed appreciation of new ways of thinking that he was introducing to them.  He became emotional during the second song and afterward left the room briefly while others were seen crying and sharing tissues.  He did return as soon as he regained his composure.  He shared that his family is not supportive at all, and that he is the sole family member who is not involved in either doing drugs or selling  drugs, and as the sole family member who wants to be sober and lead a life not involved with drugs, he is the "black sheep" of his family.  Other patients expressed their appreciation for intense involvement in this group.  Therapeutic Modalities:   Education Activity Processing  Maretta Los

## 2020-07-11 NOTE — Progress Notes (Signed)
[facility] MD Progress Note  07/11/2020 11:13 AM [facility]  MRN:  [id number] Subjective:  Patient reports that he overall feels ok this morning. Patient reports that mood is a bit down and he does not wish to attend groups today, but reports he will leave his room today. Patient reports that he still having difficulty eating, but is continuing to try. Patient was notified that his CT Abd was negative for pancreatitis. Patient reported that he slept ok overnight. Patient does not endorse SI, HI, nor AVH.   Principal Problem: Major depressive disorder, recurrent severe without psychotic features ([place]) Diagnosis: Principal Problem:   Major depressive disorder, recurrent severe without psychotic features ([place]) Active Problems:   Opiate dependence ([place])   Cocaine abuse ([place])  Total Time spent with patient: 15 minutes  Past Psychiatric History: See H&P  Past Medical History:  Past Medical History:  Diagnosis Date  . Chronic back pain   . Chronic hand pain   . DDD (degenerative disc disease), cervical   . De Quervain's tenosynovitis   . Drug-seeking behavior   . Narcotic dependence ([place])     Past Surgical History:  Procedure Laterality Date  . MASS EXCISION N/A 03/13/2018   Procedure: EXCISION LIPOMA SCALP;  Surgeon: Aviva Signs, MD;  Location: [facility];  Service: General;  Laterality: N/A;  pt knows to arrive at 6:15  . SPINAL FUSION     Family History:  Family History  Family history unknown: Yes   Family Psychiatric  History: See H&P Social History:  Social History   Substance and Sexual Activity  Alcohol Use No     Social History   Substance and Sexual Activity  Drug Use Yes   Comment: heroin and Fentanyl    Social History   Socioeconomic History  . Marital status: Legally Separated    Spouse name: Not on file  . Number of children: Not on file  . Years of education: Not on file  . Highest education level: Not on file  Occupational History  . Not on file   Tobacco Use  . Smoking status: Current Every Day Smoker    Packs/day: 0.50    Years: 16.00    Pack years: 8.00    Types: Cigarettes  . Smokeless tobacco: Never Used  Vaping Use  . Vaping Use: Never used  Substance and Sexual Activity  . Alcohol use: No  . Drug use: Yes    Comment: heroin and Fentanyl  . Sexual activity: Yes    Birth control/protection: None  Other Topics Concern  . Not on file  Social History Narrative  . Not on file   Social Determinants of Health   Financial Resource Strain: Not on file  Food Insecurity: Not on file  Transportation Needs: Not on file  Physical Activity: Not on file  Stress: Not on file  Social Connections: Not on file   Additional Social History:                         Sleep: Fair  Appetite:  Poor  Current Medications: Current Facility-Administered Medications  Medication Dose Route Frequency Provider Last Rate Last Admin  . acetaminophen (TYLENOL) tablet 650 mg  650 mg Oral Q6H PRN Sharma Covert, MD   650 mg at 07/02/20 2035  . alum & mag hydroxide-simeth (MAALOX/MYLANTA) 200-200-20 MG/5ML suspension 30 mL  30 mL Oral Q4H PRN Sharma Covert, MD   30 mL at 07/04/20 1012  .  cloNIDine (CATAPRES) tablet 0.1 mg  0.1 mg Oral Daily Patrecia Pour, NP   0.1 mg at 07/11/20 0900  . dicyclomine (BENTYL) tablet 20 mg  20 mg Oral TID AC & HS Lopaka Karge B, MD   20 mg at 07/11/20 0900  . escitalopram (LEXAPRO) tablet 20 mg  20 mg Oral Daily Damita Dunnings B, MD   20 mg at 07/11/20 0900  . feeding supplement (ENSURE ENLIVE / ENSURE PLUS) liquid 237 mL  237 mL Oral BID BM Wilfredo Canterbury B, MD   237 mL at 07/08/20 1411  . gabapentin (NEURONTIN) capsule 300 mg  300 mg Oral TID Dixie Dials, MD   300 mg at 07/11/20 0900  . ibuprofen (ADVIL) tablet 600 mg  600 mg Oral Q6H PRN Cristofano, Dorene Ar, MD   600 mg at 07/11/20 1014  . magnesium hydroxide (MILK OF MAGNESIA) suspension 30 mL  30 mL Oral Daily PRN Sharma Covert,  MD      . nicotine (NICODERM CQ - dosed in mg/24 hours) patch 21 mg  21 mg Transdermal Daily Sharma Covert, MD   21 mg at 07/11/20 0900  . ondansetron (ZOFRAN) tablet 4 mg  4 mg Oral TID with meals Damita Dunnings B, MD   4 mg at 07/11/20 0900  . pantoprazole (PROTONIX) EC tablet 40 mg  40 mg Oral BID Damita Dunnings B, MD   40 mg at 07/11/20 0900  . prazosin (MINIPRESS) capsule 1 mg  1 mg Oral QHS Damita Dunnings B, MD   1 mg at 07/10/20 2151  . QUEtiapine (SEROQUEL) tablet 100 mg  100 mg Oral QHS PRN Damita Dunnings B, MD   100 mg at 07/10/20 2151  . ziprasidone (GEODON) injection 20 mg  20 mg Intramuscular Q12H PRN Freida Busman, MD        Lab Results:  Results for orders placed or performed during the hospital encounter of 06/29/20 (from the past 48 hour(s))  SARS CORONAVIRUS 2 (TAT 6-24 HRS) Nasopharyngeal Nasopharyngeal Swab     Status: None   Collection Time: 07/09/20  3:55 PM   Specimen: Nasopharyngeal Swab  Result Value Ref Range   SARS Coronavirus 2 NEGATIVE NEGATIVE    Comment: (NOTE) SARS-CoV-2 target nucleic acids are NOT DETECTED.  The SARS-CoV-2 RNA is generally detectable in upper and lower respiratory specimens during the acute phase of infection. Negative results do not preclude SARS-CoV-2 infection, do not rule out co-infections with other pathogens, and should not be used as the sole basis for treatment or other patient management decisions. Negative results must be combined with clinical observations, patient history, and epidemiological information. The expected result is Negative.  Fact Sheet for Patients: SugarRoll.be  Fact Sheet for Healthcare Providers: https://www.woods-mathews.com/  This test is not yet approved or cleared by the [place] FDA and  has been authorized for detection and/or diagnosis of SARS-CoV-2 by FDA under an Emergency Use Authorization (EUA). This EUA will remain  in effect (meaning this  test can be used) for the duration of the COVID-19 declaration under Se ction 564(b)(1) of the Act, 21 U.S.C. section 360bbb-3(b)(1), unless the authorization is terminated or revoked sooner.  Performed at [facility], [place] [address] [place], [place] [address]   Hemoglobin A1c     Status: None   Collection Time: 07/09/20  6:08 PM  Result Value Ref Range   Hgb A1c MFr Bld 5.6 4.8 - 5.6 %    Comment: (NOTE)  Pre diabetes:          5.7%-6.4%  Diabetes:              >6.4%  Glycemic control for   <7.0% adults with diabetes    Mean Plasma Glucose 114.02 mg/dL    Comment: Performed at [facility] [address] [place], [place] [address]  Lipid panel     Status: None   Collection Time: 07/09/20  6:08 PM  Result Value Ref Range   Cholesterol 169 0 - 200 mg/dL   Triglycerides 140 <150 mg/dL   HDL 52 >40 mg/dL   Total CHOL/HDL Ratio 3.3 RATIO   VLDL 28 0 - 40 mg/dL   LDL Cholesterol 89 0 - 99 mg/dL    Comment:        Total Cholesterol/HDL:CHD Risk Coronary Heart Disease Risk Table                     Men   Women  1/2 Average Risk   3.4   3.3  Average Risk       5.0   4.4  2 X Average Risk   9.6   7.1  3 X Average Risk  23.4   11.0        Use the calculated Patient Ratio above and the CHD Risk Table to determine the patient's CHD Risk.        ATP III CLASSIFICATION (LDL):  <100     mg/dL   Optimal  100-129  mg/dL   Near or Above                    Optimal  130-159  mg/dL   Borderline  160-189  mg/dL   High  >190     mg/dL   Very High Performed at [facility] [address], [place], [place] [address]   TSH     Status: None   Collection Time: 07/09/20  6:08 PM  Result Value Ref Range   TSH 0.389 0.350 - 4.500 uIU/mL    Comment: Performed by a 3rd Generation assay with a functional sensitivity of <=0.01 uIU/mL. Performed at [facility], [place] [address] [place], [place] [address]     Blood Alcohol level:  Lab  Results  Component Value Date   ETH <10 06/28/2020   ETH <10 81/15/7262    Metabolic Disorder Labs: Lab Results  Component Value Date   HGBA1C 5.6 07/09/2020   MPG 114.02 07/09/2020   No results found for: PROLACTIN Lab Results  Component Value Date   CHOL 169 07/09/2020   TRIG 140 07/09/2020   HDL 52 07/09/2020   CHOLHDL 3.3 07/09/2020   VLDL 28 07/09/2020   LDLCALC 89 07/09/2020    Physical Findings: AIMS: Facial and Oral Movements Muscles of Facial Expression: None, normal Lips and Perioral Area: None, normal Jaw: None, normal Tongue: None, normal,Extremity Movements Upper (arms, wrists, hands, fingers): None, normal Lower (legs, knees, ankles, toes): None, normal, Trunk Movements Neck, shoulders, hips: None, normal, Overall Severity Severity of abnormal movements (highest score from questions above): None, normal Incapacitation due to abnormal movements: None, normal Patient's awareness of abnormal movements (rate only patient's report): No Awareness, Dental Status Current problems with teeth and/or dentures?: No Does patient usually wear dentures?: No  CIWA:    COWS:  COWS Total Score: 1  Musculoskeletal: Strength & Muscle Tone: within normal limits Gait & Station: normal Patient leans:  N/A  Psychiatric Specialty Exam: Physical Exam Constitutional:      Appearance: Normal appearance.  HENT:     Head: Normocephalic and atraumatic.  Pulmonary:     Effort: Pulmonary effort is normal.  Neurological:     Mental Status: He is alert.     Review of Systems  Cardiovascular: Positive for chest pain.  Gastrointestinal: Positive for abdominal pain.  Neurological: Negative for headaches.    Blood pressure (!) 134/92, pulse (!) 124, temperature 98.3 F (36.8 C), temperature source Oral, resp. rate 16, height 5\' 3"  (1.6 m), weight 61.5 kg, SpO2 100 %.Body mass index is 24 kg/m.  General Appearance: Casual  Eye Contact:  Minimal  Speech:  Clear and Coherent   Volume:  Decreased  Mood:  Dysphoric  Affect:  Congruent  Thought Process:  Coherent  Orientation:  Full (Time, Place, and Person)  Thought Content:  Logical  Suicidal Thoughts:  No  Homicidal Thoughts:  No  Memory:  Recent;   Good  Judgement:  Other:  Improving  Insight:  Shallow  Psychomotor Activity:  Normal  Concentration:  Concentration: Fair  Recall:  NA  Fund of Knowledge:  Fair  Language:  Good  Akathisia:  No  Handed:  Right  AIMS (if indicated):     Assets:  Communication Skills Desire for Improvement Housing Leisure Time  ADL's:  Intact  Cognition:  WNL  Sleep:  Number of Hours: 4.75     Treatment Plan Summary: Daily contact with patient to assess and evaluate symptoms and progress in treatment Mr. Peterka is a 40 yo patient who is being treated for MDD, severe, w/o psychotic features, Cocaine abuse, and opiate dependence and was treated for opiate withdrawal. Patient continues to have difficulty eating but his labs are not concerning for dehydration and his CT abdomen was negative for etiology. Patient has been compliant with his Protonix and bentyl. Patient was encouraged to spend more time focusing on PO intake today and patient agreed that he would try to work on finishing small items. Patient appears to report dysphoric mood in the morning and is often seen walking the halls of the unit alone in the AM. As the day goes on he becomes more interactive. Will also get and EKG for patient as he is reporting some chest pain and possible palpitations, will also recheck QTc. Patient was initially hesitant but later agreed to the EKG.  MDD, recurrent, severe, w/o psychotic features Insomnia -ContLexapro 20mg  - Gabapentin 300mg  TID -ContinueSeroquel 100mg  QHS PRN -ContinuePrazosin 1mg  QHS    Opiate withdrawal- improved - Clonidine 0.1mg  daily - Bentyl 20mg  TID PRN - Zofran 4mg  AC - Protonix 40mg  BID   Cocaine abuse -EKG   Tobacco use  disorder -Nicotine patch 21mg  daily  PRN -Tylenol 650mg  q6h, pain -Maalox 62ml q4h, indigestion -Atarax 25mg  TID, anxiety -Milk of Mag 19mL, constipation - Advil 600mg  q6h   PGY-1 Freida Busman, MD 07/11/2020, 11:13 AM

## 2020-07-11 NOTE — [facility] Group Notes (Signed)
Adult Psychoeducational Group Not Date:  07/11/2020 Time:  2778-2423 Group Topic/Focus: PROGRESSIVE RELAXATION. A group where deep breathing is taught and tensing and relaxation muscle groups is used. Imagery is used as well.  Pts are asked to imagine 3 pillars that hold them up when they are not able to hold themselves up.  Participation Level:  Went in and out of the room, but did not stay    Steve Henry 07/04/2020

## 2020-07-11 NOTE — Discharge Summary (Addendum)
Physician Discharge Summary Note  Patient:  Steve Henry is an 40 y.o., male MRN:  [id number] DOB:  [birth year] Patient phone:  [phone] (home)  Patient address:   [address] [place] [address],  Total Time spent with patient: 15 minutes  Date of Admission:  06/29/2020 Date of Discharge: 07/12/2020  Reason for Admission:  Patient interviewed this AM in the observation area. Patient noted to be writhing in bed d/t opiate withdrawal, appearing in moderate distress. He reports SI, unable to contract for safety. Denies HI/AVH. He reports opiate withdrawal sx of chills, body aches, abdominal pain, diaphoresis. Denies diarrhea. Pt irritable, puts covers over his head and stops asking questions. Informed patient of PRN medications ordered as well as recommendation for inpatient admission as unable to contract for safety. Pt verbalized undestanding.  Stay Summary: Steve Henry is a 40 y.o. male who presented to [facility] requesting assistance for heroin/fentanyl withdrawal. He was transferred to [facility] for treatment and stabilization.   On evaluation, patient is alert and oriented x 4. He is pleasant and cooperative. Speech is clear and coherent. He reports that he has been having a hard time since losing custody of his children due to substance abuse. He reports using 0.5 gm to 1 gm daily of heroin/fentanyl. Reports last use 2 days ago. Denies use of alcohol and other substances. UDS positive for opiates, amphetamines, and cocaine. BAL <10. He reported in the ED that he was having nausea, vomiting, diarrhea, and chills. He states that he has not any any vomiting or diarrhea after arrival to the ED. He was given phenergan IV, bentyl IM, robaxin IV, imodium PO, zofran IV, and toradol IV while in the ED. He reports that he continues to have chills. He is shaking thouhgout the assessment. His thought process is coherent and thought content is logical. He denies auditory and visual  hallucinations. No indication that he is responding to internal stimuli. No evidence of delusional thought content. When asked about SI, he states "not yet." He denies homicidal ideations.  Patient admitted for overnight observation. The following morning, patient reported continued SI and was unable to contract for safety (see above for additional details) and recommended inpatient admission. Pt accepted by [facility]"   Principal Problem: Major depressive disorder, recurrent severe without psychotic features [facility]) Discharge Diagnoses: Principal Problem:   Major depressive disorder, recurrent severe without psychotic features ([place]) Active Problems:   Opiate dependence ([place])   Cocaine abuse ([place])   Past Psychiatric History: Notable for poly susbtance abuse   Past Medical History:  Past Medical History:  Diagnosis Date  . Chronic back pain   . Chronic hand pain   . DDD (degenerative disc disease), cervical   . De Quervain's tenosynovitis   . Drug-seeking behavior   . Narcotic dependence ([place])     Past Surgical History:  Procedure Laterality Date  . MASS EXCISION N/A 03/13/2018   Procedure: EXCISION LIPOMA SCALP;  Surgeon: Aviva Signs, MD;  Location: [facility];  Service: General;  Laterality: N/A;  pt knows to arrive at 6:15  . SPINAL FUSION     Family History:  Family History  Family history unknown: Yes   Family Psychiatric  History: Unknown Social History:  Social History   Substance and Sexual Activity  Alcohol Use No     Social History   Substance and Sexual Activity  Drug Use Yes   Comment: heroin and Fentanyl    Social History   Socioeconomic History  .  Marital status: Legally Separated    Spouse name: Not on file  . Number of children: Not on file  . Years of education: Not on file  . Highest education level: Not on file  Occupational History  . Not on file  Tobacco Use  . Smoking status: Current Every Day Smoker    Packs/day: 0.50    Years:  16.00    Pack years: 8.00    Types: Cigarettes  . Smokeless tobacco: Never Used  Vaping Use  . Vaping Use: Never used  Substance and Sexual Activity  . Alcohol use: No  . Drug use: Yes    Comment: heroin and Fentanyl  . Sexual activity: Yes    Birth control/protection: None  Other Topics Concern  . Not on file  Social History Narrative  . Not on file   Social Determinants of Health   Financial Resource Strain: Not on file  Food Insecurity: Not on file  Transportation Needs: Not on file  Physical Activity: Not on file  Stress: Not on file  Social Connections: Not on file    Hospital Course:  Mr. Steve Henry presented voluntarily for heroin/fentnyl withdrawal. Patient was noted to be in pain when he had first arrived to [facility]. Patient was then transferred to [facility] for continued withdrawal. Patient was placed on Clonidine Detox Protocol. Patient also noted that he had had dysphoric mood, reporting that he did not think he should be alive, and was started on Lexapro. This was titrated up to 20mg  before patient began reporting improvement in mood. Patient also became more interactive on the unit and attended groups with improvement in his mood. Patient was also prescribed gabapentin to help with his urges and reported anxiety. Patient withdrawal was prolonged. Patient reported problems with his sleep and was started on Seroquel to help augment his mood and help with his sleep. This Seroquel was titrated up to 100mg  QHS and patient was noted to have symptomatic improvement. Patient QTc remained appropriate throughout hospitalization. Patient was also started on Prazosin for nightmares related to PTSD as he went through withdrawal. Patient withdrawal was further complicated by abdominal pain and inability to keep solid food down. Initially patient was also reporting diarrhea but this decreased as his PO intake decreased.  Labs noted normal lipase with increased Amylase. CT abdomen was negative for  pancreatitis. Labs were negative for electrolyte abnormalities. Patient was started on Protonix, Zofran, and Bentyl to aid with his abdominal pain and decreased appetite. Day 10 patient Heart rate dropped below 100bpm and patient's only other symptom from withdrawal was his abdominal pain. Patient was noted to continue to interact on the mileu and intermittently attended groups. Patient was no longer reported SI by discharge and reported that his children were his biggest reason to live. Patient did get into an argument with another patient on the unit, but patient was able to be redirected and he and this patient had no more problems. Patient denies SI, HI, and AVH and is discharging to a rehab facility. Patient reports that he remains interested in rehabilitation.   Physical Findings: AIMS: Facial and Oral Movements Muscles of Facial Expression: None, normal Lips and Perioral Area: None, normal Jaw: None, normal Tongue: None, normal,Extremity Movements Upper (arms, wrists, hands, fingers): None, normal Lower (legs, knees, ankles, toes): None, normal, Trunk Movements Neck, shoulders, hips: None, normal, Overall Severity Severity of abnormal movements (highest score from questions above): None, normal Incapacitation due to abnormal movements: None, normal Patient's awareness of abnormal movements (  rate only patient's report): No Awareness, Dental Status Current problems with teeth and/or dentures?: No Does patient usually wear dentures?: No  CIWA:    COWS:  COWS Total Score: 1  Musculoskeletal: Strength & Muscle Tone: within normal limits Gait & Station: normal Patient leans: N/A  Psychiatric Specialty Exam: Physical Exam Constitutional:      Appearance: Normal appearance.  HENT:     Head: Normocephalic and atraumatic.  Pulmonary:     Effort: Pulmonary effort is normal.  Neurological:     Mental Status: He is alert.     Review of Systems  Constitutional: Negative for fever.   Neurological: Negative for headaches.  Psychiatric/Behavioral: Negative for hallucinations.    Blood pressure (!) 134/92, pulse (!) 124, temperature 98.3 F (36.8 C), temperature source Oral, resp. rate 16, height 5\' 3"  (1.6 m), weight 61.5 kg, SpO2 100 %.Body mass index is 24 kg/m.  General Appearance: Casual  Eye Contact:  Fair  Speech:  Clear and Coherent  Volume:  Decreased  Mood:  Euthymic  Affect:  Flat  Thought Process:  Coherent  Orientation:  Full (Time, Place, and Person)  Thought Content:  Logical  Suicidal Thoughts:  No  Homicidal Thoughts:  No  Memory:  Recent;   Fair  Judgement:  Other:  Improved  Insight:  Fair  Psychomotor Activity:  Normal  Concentration:  Concentration: Good  Recall:  NA  Fund of Knowledge:  Good  Language:  Good  Akathisia:  No  Handed:  Right  AIMS (if indicated):     Assets:  Communication Skills Desire for Improvement Housing Resilience  ADL's:  Intact  Cognition:  WNL  Sleep:  Number of Hours: 4.75     Have you used any form of tobacco in the last 30 days? (Cigarettes, Smokeless Tobacco, Cigars, and/or Pipes): Yes Patient will be reevaluated at his rehab facility.    Blood Alcohol level:  Lab Results  Component Value Date   ETH <10 06/28/2020   ETH <10 22/07/5425    Metabolic Disorder Labs:  Lab Results  Component Value Date   HGBA1C 5.6 07/09/2020   MPG 114.02 07/09/2020   No results found for: PROLACTIN Lab Results  Component Value Date   CHOL 169 07/09/2020   TRIG 140 07/09/2020   HDL 52 07/09/2020   CHOLHDL 3.3 07/09/2020   VLDL 28 07/09/2020   LDLCALC 89 07/09/2020    See Psychiatric Specialty Exam and Suicide Risk Assessment completed by Attending Physician prior to discharge.  Discharge destination:  [facility]  Is patient on multiple antipsychotic therapies at discharge:  No   Has Patient had three or more failed trials of antipsychotic monotherapy by history:  No  Recommended Plan for Multiple  Antipsychotic Therapies: NA  Discharge Instructions    Diet - low sodium heart healthy   Complete by: As directed    Increase activity slowly   Complete by: As directed    Increase activity slowly   Complete by: As directed      Allergies as of 07/11/2020      Reactions   Tramadol Hives   Latex Rash      Medication List    STOP taking these medications   Buprenorphine HCl-Naloxone HCl 8-2 MG Film     TAKE these medications     Indication  escitalopram 20 MG tablet Commonly known as: LEXAPRO Take 1 tablet (20 mg total) by mouth daily. Start taking on: July 12, 2020  Indication: Generalized Anxiety Disorder, Major  Depressive Disorder, Posttraumatic Stress Disorder   gabapentin 300 MG capsule Commonly known as: NEURONTIN Take 1 capsule (300 mg total) by mouth 3 (three) times daily.  Indication: Abuse or Misuse of Alcohol, Social Anxiety Disorder   pantoprazole 40 MG tablet Commonly known as: PROTONIX Take 1 tablet (40 mg total) by mouth 2 (two) times daily.  Indication: Gastroesophageal Reflux Disease   prazosin 1 MG capsule Commonly known as: MINIPRESS Take 1 capsule (1 mg total) by mouth at bedtime.  Indication: Frightening Dreams, Disturbed Sleep   QUEtiapine 100 MG tablet Commonly known as: SEROQUEL Take 1 tablet (100 mg total) by mouth at bedtime.  Indication: Agitation, Generalized Anxiety Disorder, Major Depressive Disorder       Follow-up [place] Follow up.   Specialty: Addiction Medicine Why: Referral made, but patient was declined. Contact information: [place] [address] [facility]. Go on 07/12/2020.   Why: Admission to rehab is expected on 1/31 at 10:00am. Contact information: [address] [place] [address] [phone]               Follow-up recommendations: Follow up recommendations: - Activity  as tolerated. - Diet as recommended by PCP. - Keep all scheduled follow-up appointments as recommended.   Comments:  Patient is instructed to take all prescribed medications as recommended. Report any side effects or adverse reactions to your outpatient psychiatrist. Patient is instructed to abstain from alcohol and illegal drugs while on prescription medications. In the event of worsening symptoms, patient is instructed to call the crisis hotline, 911, or go to the nearest emergency department for evaluation and treatment.    Signed: PGY-1 Freida Busman, MD 07/11/2020, 3:52 PM

## 2020-07-11 NOTE — Progress Notes (Signed)
  [facility] Adult Case Management Discharge Plan :  Will you be returning to the same living situation after discharge:  No.   Going to rehab At discharge, do you have transportation home?: No.   CSW arranging Do you have the ability to pay for your medications: Yes,  Medicaid  Release of information consent forms completed and in the chart;  Patient's signature needed at discharge.  Patient to Follow up at:  Follow-up [place] Follow up.   Specialty: Addiction Medicine Why: Referral made, but patient was declined. Contact information: [place] [address] [facility]. Go on 07/12/2020.   Why: Admission to rehab is expected on 1/31 at 10:00am. Contact information: [address] [place] [address] [phone]               Next level of care provider has access to [place] and Suicide Prevention discussed: No.  Declined by patient  Have you used any form of tobacco in the last 30 days? (Cigarettes, Smokeless Tobacco, Cigars, and/or Pipes): Yes  Has patient been referred to the Quitline?: Patient refused referral   - patient going to rehab, so will not be available even if referral made  Patient has been referred for addiction treatment: Yes  Maretta Los, LCSW 07/11/2020, 9:59 AM

## 2020-07-11 NOTE — BHH Counselor (Signed)
Clinical Social Work Note  Patient has been accepted to [facility] for Monday 1/31 at 10:00am, confirmed this day by CSW phone call to the facility.  The pt reports having no transportation available to take him.  CSW called [facility] to arrange, but was told they cannot arrange in advance.  CSW will call 1/31 at 7am, per their instruction, to arrange transportation for as close to 8am as possible.  Selmer Dominion, LCSW 07/11/2020, 9:43 AM

## 2020-07-11 NOTE — BHH Group Notes (Signed)
.  Psychoeducational Group Note  Date: 07-10-2220 Time: 0900-1000    Goal Setting   Purpose of Group: This group helps to provide patients with the steps of setting a goal that is specific, measurable, attainable, realistic and time specific. A discussion on how we keep ourselves stuck with negative self talk.    Participation Level:  Pt started out partisipating but soon left the group. Would go in and out      . Did not stay  For any long period of time  Participation Quality:  Appropriate  Affect:  Appropriate  Cognitive:  Appropriate  Insight:  Improving  Engagement in Group:  Engaged  Additional Comments:  ...  Steve Henry

## 2020-07-12 DIAGNOSIS — F1994 Other psychoactive substance use, unspecified with psychoactive substance-induced mood disorder: Secondary | ICD-10-CM

## 2020-07-12 NOTE — Progress Notes (Signed)
D:  Patient discharged.  Patient denied SI and HI.  Denied A/V hallucinations. A:  Medications administered per MD orders.  Emotional support and encouragement given patient. R:  Safety maintained with 15 minute checks.

## 2020-07-12 NOTE — Progress Notes (Signed)
D:  Patient denied SI and HI, contracts for safety.   Denied A/V hallucinations.  Denied pain. A:  Medications administered per MD Orders.  Emotional support and encouragement given patient. R:   Safety maintained with 15 minute checks.
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
